# Patient Record
Sex: Female | Born: 1970
Health system: Southern US, Community
[De-identification: ages and names within clinical notes are randomized; demographics above are authoritative.]

## PROBLEM LIST (undated history)

## (undated) DIAGNOSIS — E119 Type 2 diabetes mellitus without complications: Secondary | ICD-10-CM

## (undated) DIAGNOSIS — R7303 Prediabetes: Secondary | ICD-10-CM

## (undated) DIAGNOSIS — R0689 Other abnormalities of breathing: Secondary | ICD-10-CM

## (undated) DIAGNOSIS — E669 Obesity, unspecified: Secondary | ICD-10-CM

## (undated) DIAGNOSIS — F329 Major depressive disorder, single episode, unspecified: Secondary | ICD-10-CM

## (undated) DIAGNOSIS — Z9889 Other specified postprocedural states: Secondary | ICD-10-CM

## (undated) DIAGNOSIS — E282 Polycystic ovarian syndrome: Secondary | ICD-10-CM

## (undated) DIAGNOSIS — R030 Elevated blood-pressure reading, without diagnosis of hypertension: Secondary | ICD-10-CM

## (undated) DIAGNOSIS — N920 Excessive and frequent menstruation with regular cycle: Secondary | ICD-10-CM

## (undated) DIAGNOSIS — D219 Benign neoplasm of connective and other soft tissue, unspecified: Secondary | ICD-10-CM

## (undated) DIAGNOSIS — F32A Depression, unspecified: Secondary | ICD-10-CM

## (undated) DIAGNOSIS — F419 Anxiety disorder, unspecified: Secondary | ICD-10-CM

## (undated) DIAGNOSIS — I499 Cardiac arrhythmia, unspecified: Secondary | ICD-10-CM

## (undated) DIAGNOSIS — I4891 Unspecified atrial fibrillation: Secondary | ICD-10-CM

## (undated) DIAGNOSIS — D649 Anemia, unspecified: Secondary | ICD-10-CM

## (undated) DIAGNOSIS — IMO0001 Reserved for inherently not codable concepts without codable children: Secondary | ICD-10-CM

## (undated) DIAGNOSIS — R112 Nausea with vomiting, unspecified: Secondary | ICD-10-CM

## (undated) DIAGNOSIS — Z9289 Personal history of other medical treatment: Secondary | ICD-10-CM

## (undated) DIAGNOSIS — E039 Hypothyroidism, unspecified: Secondary | ICD-10-CM

## (undated) DIAGNOSIS — G4733 Obstructive sleep apnea (adult) (pediatric): Secondary | ICD-10-CM

## (undated) HISTORY — DX: Depression, unspecified: F32.A

## (undated) HISTORY — DX: Obstructive sleep apnea (adult) (pediatric): G47.33

## (undated) HISTORY — DX: Reserved for inherently not codable concepts without codable children: IMO0001

## (undated) HISTORY — DX: Personal history of other medical treatment: Z92.89

## (undated) HISTORY — DX: Hypothyroidism, unspecified: E03.9

## (undated) HISTORY — PX: ENDOMETRIAL ABLATION: SHX621

## (undated) HISTORY — DX: Benign neoplasm of connective and other soft tissue, unspecified: D21.9

## (undated) HISTORY — PX: DILATION AND CURETTAGE, DIAGNOSTIC / THERAPEUTIC: SUR384

## (undated) HISTORY — DX: Unspecified atrial fibrillation: I48.91

## (undated) HISTORY — DX: Polycystic ovarian syndrome: E28.2

## (undated) HISTORY — DX: Obesity, unspecified: E66.9

## (undated) HISTORY — DX: Elevated blood-pressure reading, without diagnosis of hypertension: R03.0

## (undated) HISTORY — DX: Major depressive disorder, single episode, unspecified: F32.9

## (undated) HISTORY — DX: Prediabetes: R73.03

## (undated) HISTORY — DX: Excessive and frequent menstruation with regular cycle: N92.0

## (undated) HISTORY — DX: Anxiety disorder, unspecified: F41.9

## (undated) HISTORY — PX: BREAST SURGERY: SHX581

---

## 1995-09-04 HISTORY — PX: BLADDER SURGERY: SHX569

## 1999-06-01 ENCOUNTER — Other Ambulatory Visit: Admission: RE | Admit: 1999-06-01 | Discharge: 1999-06-01 | Payer: Self-pay | Admitting: Gynecology

## 1999-12-09 ENCOUNTER — Encounter: Payer: Self-pay | Admitting: Emergency Medicine

## 1999-12-09 ENCOUNTER — Emergency Department (HOSPITAL_COMMUNITY): Admission: EM | Admit: 1999-12-09 | Discharge: 1999-12-09 | Payer: Self-pay | Admitting: Internal Medicine

## 2000-01-05 ENCOUNTER — Other Ambulatory Visit: Admission: RE | Admit: 2000-01-05 | Discharge: 2000-01-05 | Payer: Self-pay | Admitting: Family Medicine

## 2000-09-09 ENCOUNTER — Ambulatory Visit (HOSPITAL_COMMUNITY): Admission: RE | Admit: 2000-09-09 | Discharge: 2000-09-09 | Payer: Self-pay | Admitting: *Deleted

## 2000-09-09 ENCOUNTER — Encounter: Payer: Self-pay | Admitting: *Deleted

## 2004-04-07 ENCOUNTER — Encounter: Admission: RE | Admit: 2004-04-07 | Discharge: 2004-04-07 | Payer: Self-pay | Admitting: *Deleted

## 2004-09-03 HISTORY — PX: HAND SURGERY: SHX662

## 2004-09-03 HISTORY — PX: RENAL BIOPSY, OPEN: SUR143

## 2005-03-19 ENCOUNTER — Emergency Department (HOSPITAL_COMMUNITY): Admission: EM | Admit: 2005-03-19 | Discharge: 2005-03-19 | Payer: Self-pay | Admitting: Emergency Medicine

## 2005-09-03 HISTORY — PX: TONSILLECTOMY: SUR1361

## 2008-07-05 DIAGNOSIS — M76829 Posterior tibial tendinitis, unspecified leg: Secondary | ICD-10-CM

## 2008-07-20 DIAGNOSIS — M722 Plantar fascial fibromatosis: Secondary | ICD-10-CM

## 2009-07-20 ENCOUNTER — Ambulatory Visit: Payer: Self-pay | Admitting: Sports Medicine

## 2009-07-20 DIAGNOSIS — M79609 Pain in unspecified limb: Secondary | ICD-10-CM | POA: Insufficient documentation

## 2009-09-13 ENCOUNTER — Encounter: Payer: Self-pay | Admitting: Internal Medicine

## 2010-01-16 ENCOUNTER — Ambulatory Visit: Payer: Self-pay | Admitting: Sports Medicine

## 2010-01-16 DIAGNOSIS — R269 Unspecified abnormalities of gait and mobility: Secondary | ICD-10-CM | POA: Insufficient documentation

## 2010-08-03 HISTORY — PX: ENDOMETRIAL ABLATION: SHX621

## 2010-08-29 ENCOUNTER — Ambulatory Visit (HOSPITAL_COMMUNITY)
Admission: RE | Admit: 2010-08-29 | Discharge: 2010-08-29 | Payer: Self-pay | Source: Home / Self Care | Attending: Obstetrics and Gynecology | Admitting: Obstetrics and Gynecology

## 2010-10-05 NOTE — Assessment & Plan Note (Signed)
Summary: 11:45 APPT FU BONE SPUR/PLANTAR FASCIITIS   Vital Signs:  Patient profile:   40 year old female BP sitting:   110 / 76  Vitals Entered By: Enid Baas MD (Jan 16, 2010 12:09 PM)  History of Present Illness: Left foot is much improved  however, if she fails to wear arch suport the PF sxs return now no real morning paijn walking sans limp no longer hurts to touch heel  Physical Exam  General:  Well-developed,well-nourished,in no acute distress; alert,appropriate and cooperative throughout examination Msk:  now nontender to palp at insertin of PF into left heel seated she has mod high arch but when she stands she has bilat long arch collapse also on LT she gets midfoot collapse and shift into calcaneal valgus good great toe motion  MSK [place] PF on left is now down to 0.57 and texture looks normal compared to Nov when was 0.68 and there was edema one small nodule noted 2 cms distal to insertion  RT PF however is only 0.34 thich and appears free of nodules   Impression & Recommendations:  Problem # 1:  FOOT PAIN (ICD-729.5)  This is much less but still needs good arch support particularly on left  given additional scaphoid pads for other shoes  Orders: [place] LIMITED ([id number])  Problem # 2:  PLANTAR FASCIITIS, LEFT (ICD-728.71)  much improved but still 60% thicker than RT  cont exrcises and stretches some icing  reck 4 mos  Orders: [place] LIMITED ([id number])  Problem # 3:  GAIT ABNORMALITY (ICD-781.2) doing OK with OTc inserts and arch supports  however these do not completley correct pronated gait on LT  consider custom orthotics if not getting pain relief in future

## 2010-11-13 LAB — CBC
HCT: 41.3 % (ref 36.0–46.0)
Hemoglobin: 13.9 g/dL (ref 12.0–15.0)
MCH: 30.5 pg (ref 26.0–34.0)
MCHC: 33.7 g/dL (ref 30.0–36.0)
MCV: 90.6 fL (ref 78.0–100.0)
Platelets: 294 10*3/uL (ref 150–400)
RBC: 4.56 MIL/uL (ref 3.87–5.11)
RDW: 12.4 % (ref 11.5–15.5)
WBC: 7 10*3/uL (ref 4.0–10.5)

## 2010-11-13 LAB — BASIC METABOLIC PANEL
BUN: 10 mg/dL (ref 6–23)
CO2: 28 mEq/L (ref 19–32)
Calcium: 8.8 mg/dL (ref 8.4–10.5)
Chloride: 107 mEq/L (ref 96–112)
Creatinine, Ser: 0.83 mg/dL (ref 0.4–1.2)
GFR calc Af Amer: >60 mL/min (ref >60)
GFR calc non Af Amer: >60 mL/min (ref >60)
Glucose, Bld: 86 mg/dL (ref 70–99)
Potassium: 4.1 mEq/L (ref 3.5–5.1)
Sodium: 138 mEq/L (ref 135–145)

## 2010-11-13 LAB — GLUCOSE, CAPILLARY

## 2010-11-21 ENCOUNTER — Encounter: Payer: Self-pay | Admitting: Internal Medicine

## 2010-11-21 ENCOUNTER — Encounter: Payer: Self-pay | Admitting: *Deleted

## 2010-12-01 ENCOUNTER — Encounter: Payer: Self-pay | Admitting: Internal Medicine

## 2010-12-01 ENCOUNTER — Ambulatory Visit (INDEPENDENT_AMBULATORY_CARE_PROVIDER_SITE_OTHER): Payer: 59 | Admitting: Internal Medicine

## 2010-12-01 VITALS — BP 110/82 | HR 71 | Ht 65.0 in | Wt 274.0 lb

## 2010-12-01 DIAGNOSIS — I4891 Unspecified atrial fibrillation: Secondary | ICD-10-CM | POA: Insufficient documentation

## 2010-12-01 DIAGNOSIS — Z0000 Encounter for general adult medical examination without abnormal findings: Secondary | ICD-10-CM

## 2010-12-01 NOTE — Patient Instructions (Signed)
Your physician recommends that you schedule a follow-up appointment in: 12 months with Dr Ross. 

## 2010-12-01 NOTE — Progress Notes (Signed)
HPI Patient is a 40 year old, who was referred for care of atrial fibrillation. The patient was previously followed by a cardiologist at [facility]. He has since retired. She had an echocardiogram, as well as a holter monitor there. She has been maintained on beta blocker.  The patient is followed by Marcelle Overlie here in [place]. She is currently having her Cytomel dose adjusted.  The patient says she has frequent episodes of palpitations. Now they come about 4 days per week and last anywhere from 10 minutes to one hour. She can get a little dizzy with the spells, but no syncope. She says she has to concentrate on what she is doing. Does feel a little short of breath.  When she is not having a spells  She feels fine.  Overall, she says this has been going on for years. Actually she was in the ER as a teenager and told she was just dehydrated.   Of note, she had a sleep study done 7 years ago. That was positive for sleep apnea. She had tonsillectomy for this by Dr. Pollyann Kennedy. She has not worn CPAP. She has not been restudied .She is not that active with her job. She sits most of the day.  She is having problems with a spur in her foot    Allergies not on file  Current Outpatient Prescriptions  Medication Sig Dispense Refill  . cetirizine (ZYRTEC) 10 MG tablet Take 10 mg by mouth daily.        . Cholecalciferol (VITAMIN D3) 10000 UNITS capsule Take 10,000 Units by mouth daily.        . citalopram (CELEXA) 40 MG tablet Take 40 mg by mouth daily.        . Liothyronine Sodium (CYTOMEL PO) Take 60 mcg by mouth daily.        . metFORMIN (GLUCOPHAGE) 500 MG tablet Take 500 mg by mouth 2 (two) times daily with a meal.        . metoprolol (TOPROL-XL) 100 MG 24 hr tablet Take 100 mg by mouth daily.        . pioglitazone-metformin (ACTOPLUS MET) 15-850 MG per tablet Take 1 tablet by mouth 2 (two) times daily with a meal.          Past Medical History  Diagnosis Date  . Palpitations     2006    . Menorrhagia     2011  . Thyroid disease   . Diabetes mellitus   . Depression   . PCOS (polycystic ovarian syndrome)     Past Surgical History  Procedure Date  . Endometrial ablation   . Dilation and curettage, diagnostic / therapeutic   . Tonsillectomy     Family History  Problem Relation Age of Onset  . Cardiomyopathy Father   . Hypertension      fathers side  . Hyperlipidemia      fathers side  . Heart failure      both grandparents - paternal  . Coronary artery disease Maternal Grandmother   . Heart attack Maternal Grandmother     and other maternal family members  . Heart attack Maternal Grandfather     History   Social History  . Marital Status: Divorced    Spouse Name: N/A    Number of Children: N/A  . 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 Smoking status: Former Smoker    Types: Cigarettes  .  Smokeless tobacco: Not on file  . Alcohol Use: Yes     maybe 2 a mth  . Drug Use: Yes    Special: Marijuana     in high school -- none since  . Sexually Active: Not on file   Other Topics Concern  . Not on file   Social History Narrative  . No narrative on file    Review of Systems:  All systems reviewed.  They are negative to the above problem except as previously stated.  Vital Signs: BP 110/82  Pulse 71  Ht 5\' 5"  (1.651 m)  Wt 274 lb (124.286 kg)  BMI 45.60 kg/m2  Physical Exam OBese 40 year old in NAD  HEENT:  Normocephalic, atraumatic. EOMI, PERRLA.  Neck: JVP is normal. No thyromegaly. No bruits.  Lungs: clear to auscultation. No rales no wheezes.  Heart: Regular rate and rhythm. Normal S1, S2. No S3.   No significant murmurs. PMI not displaced.  Abdomen:  Supple, nontender. Normal bowel sounds. No masses. No hepatomegaly.  Extremities:   Good distal pulses throughout. No lower extremity edema.  Musculoskeletal :moving all extremities.  Neuro:   alert and oriented x3.   CN II-XII grossly intact.  EKG: NSR.  71 bpm.   Assessment and Plan:

## 2010-12-04 ENCOUNTER — Telehealth: Payer: Self-pay | Admitting: Internal Medicine

## 2010-12-04 NOTE — Telephone Encounter (Signed)
Pt signed 3 ROI faxed over to  1.[facility] @ [phone] 2.[facility] @ [phone] 3.[facility] @ [phone] 12/04/10/KM

## 2010-12-04 NOTE — Telephone Encounter (Signed)
Spoke with [facility] @ [place] they have NO Records on this Pt  12/04/10/KM

## 2010-12-04 NOTE — Telephone Encounter (Signed)
[facility] sent ROI back they have no Pt with Name of Desiree Schultz 12/04/10/KM

## 2010-12-05 ENCOUNTER — Encounter: Payer: Self-pay | Admitting: Internal Medicine

## 2010-12-05 NOTE — Telephone Encounter (Signed)
Records received from [facility] gave to [facility] 12/05/10/KM

## 2010-12-12 ENCOUNTER — Telehealth: Payer: Self-pay | Admitting: Internal Medicine

## 2010-12-12 NOTE — Telephone Encounter (Signed)
LMOM for call back. Patient called back and I advised her that we could not obtain records from [facility] concerning the sleep study from 2007. We received a fax stating they had no records on her. Patient will call Dr.Rosen because he ordered the sleep study so he should have the results. Patient will ask Dr.Rosen's office to fax results to Dr.Ross. I provided her with Dr.Rosen's phone # and our fax#. Her goal is to be able to get a CPAP machine without obtaining another sleep study. I advised her that her insurance company would probably require a recent sleep study in order to warrant and pay for a CAP machine.

## 2010-12-12 NOTE — Progress Notes (Signed)
Addended by: Hardin Negus on: 12/12/2010 06:16 PM   Modules accepted: Orders

## 2010-12-13 NOTE — Progress Notes (Signed)
Addended by: Hardin Negus on: 12/13/2010 12:55 PM   Modules accepted: Orders

## 2010-12-21 NOTE — Telephone Encounter (Signed)
LMOM that we still have not received a copy of the sleep study from 2007. Advised also that Dr.Ross would like her to have a echo done at this office.

## 2010-12-26 ENCOUNTER — Telehealth: Payer: Self-pay | Admitting: Internal Medicine

## 2010-12-26 NOTE — Telephone Encounter (Signed)
Pt rtn call-you should be receiving fleet study records and she had an echo a year ago at [facility] and she will get you that report, but at this time she cannot afford to have another one at this time-pls call -pt knows it will be tomorrow

## 2010-12-29 ENCOUNTER — Emergency Department (HOSPITAL_COMMUNITY)
Admission: EM | Admit: 2010-12-29 | Discharge: 2010-12-29 | Disposition: A | Discharge status: Home / Self Care | Payer: 59 | Attending: Emergency Medicine | Admitting: Emergency Medicine

## 2010-12-29 ENCOUNTER — Telehealth: Payer: Self-pay | Admitting: Internal Medicine

## 2010-12-29 DIAGNOSIS — R42 Dizziness and giddiness: Secondary | ICD-10-CM | POA: Insufficient documentation

## 2010-12-29 DIAGNOSIS — E039 Hypothyroidism, unspecified: Secondary | ICD-10-CM | POA: Insufficient documentation

## 2010-12-29 DIAGNOSIS — R000 Tachycardia, unspecified: Secondary | ICD-10-CM | POA: Insufficient documentation

## 2010-12-29 DIAGNOSIS — R002 Palpitations: Secondary | ICD-10-CM | POA: Insufficient documentation

## 2010-12-29 DIAGNOSIS — I10 Essential (primary) hypertension: Secondary | ICD-10-CM | POA: Insufficient documentation

## 2010-12-29 DIAGNOSIS — R0602 Shortness of breath: Secondary | ICD-10-CM | POA: Insufficient documentation

## 2010-12-29 DIAGNOSIS — I4891 Unspecified atrial fibrillation: Secondary | ICD-10-CM | POA: Insufficient documentation

## 2010-12-29 DIAGNOSIS — Z79899 Other long term (current) drug therapy: Secondary | ICD-10-CM | POA: Insufficient documentation

## 2010-12-29 DIAGNOSIS — E669 Obesity, unspecified: Secondary | ICD-10-CM | POA: Insufficient documentation

## 2010-12-29 DIAGNOSIS — E119 Type 2 diabetes mellitus without complications: Secondary | ICD-10-CM | POA: Insufficient documentation

## 2010-12-29 LAB — URINALYSIS, ROUTINE W REFLEX MICROSCOPIC
Bilirubin Urine: NEGATIVE
Nitrite: NEGATIVE
Specific Gravity, Urine: 1.007 (ref 1.005–1.030)
Urobilinogen, UA: 0.2 mg/dL (ref 0.0–1.0)
pH: 6 (ref 5.0–8.0)

## 2010-12-29 LAB — POCT I-STAT, CHEM 8
Calcium, Ion: 1.08 mmol/L — ABNORMAL LOW (ref 1.12–1.32)
Chloride: 109 mEq/L (ref 96–112)
Creatinine, Ser: 0.9 mg/dL (ref 0.4–1.2)
Glucose, Bld: 84 mg/dL (ref 70–99)
HCT: 44 % (ref 36.0–46.0)
Hemoglobin: 15 g/dL (ref 12.0–15.0)

## 2010-12-29 LAB — POCT CARDIAC MARKERS
CKMB, poc: <1 ng/mL — ABNORMAL LOW (ref 1.0–8.0)
Troponin i, poc: <0.05 ng/mL (ref 0.00–0.09)

## 2010-12-29 NOTE — Telephone Encounter (Signed)
Pt calling stating palpitations,SOB--crying to point very hard to understand--advised to go to [facility] ED--pt agrees and will have friend drop her off--trish called, but N.A. At this time--nt

## 2010-12-29 NOTE — Telephone Encounter (Signed)
Pt having bad palp, sob. Blood pressure 180/100.

## 2010-12-29 NOTE — Telephone Encounter (Signed)
Received sleep study from 2007. Will review with Dr.Ross.

## 2010-12-31 LAB — URINE CULTURE: Culture  Setup Time: 201204271655

## 2011-01-01 ENCOUNTER — Telehealth: Payer: Self-pay | Admitting: Internal Medicine

## 2011-01-01 NOTE — Telephone Encounter (Signed)
Pt seen in er needs to see dr Tenny Craw within 2 weeks but doesn't want to wait and needs late pm, doesn't want to see the PA-wants to talk with nurse

## 2011-01-01 NOTE — Telephone Encounter (Signed)
Called patient back. She states that she was in the ER in atrial fib and converted with IV Cardizem. Made post hosp appointment for her to see Dr.Ross on 5/7 at 1030am. Advised her that Dr.Ross did receive a copy of her sleep study from 2007 but advised that she needs a pulmonary consult and another sleep study.  She will discuss this with Dr.Ross at next appointment.

## 2011-01-01 NOTE — Telephone Encounter (Signed)
See other note for detailed phone call.

## 2011-01-08 ENCOUNTER — Telehealth: Payer: Self-pay | Admitting: Internal Medicine

## 2011-01-08 ENCOUNTER — Ambulatory Visit (INDEPENDENT_AMBULATORY_CARE_PROVIDER_SITE_OTHER): Payer: 59 | Admitting: Internal Medicine

## 2011-01-08 ENCOUNTER — Encounter: Payer: Self-pay | Admitting: Internal Medicine

## 2011-01-08 VITALS — BP 94/50 | HR 53 | Ht 65.0 in | Wt 274.0 lb

## 2011-01-08 DIAGNOSIS — I4891 Unspecified atrial fibrillation: Secondary | ICD-10-CM

## 2011-01-08 DIAGNOSIS — G4730 Sleep apnea, unspecified: Secondary | ICD-10-CM

## 2011-01-08 MED ORDER — METOPROLOL TARTRATE 25 MG PO TABS: 25.0000 mg | ORAL_TABLET | Freq: Two times a day (BID) | ORAL | Status: DC

## 2011-01-08 MED ORDER — ASPIRIN 325 MG PO TABS: 325.0000 mg | ORAL_TABLET | Freq: Every day | ORAL | Status: DC

## 2011-01-08 MED ORDER — DILTIAZEM HCL ER COATED BEADS 180 MG PO CP24: 180.0000 mg | ORAL_CAPSULE | Freq: Every day | ORAL | Status: DC

## 2011-01-08 NOTE — Telephone Encounter (Signed)
Records Received from [facility] gave to [facility] 01/08/11/km

## 2011-01-08 NOTE — Patient Instructions (Signed)
You have been referred to  The Pulmonary Dept [address]  Please get a copy of your echocardiogram on a   CD and bring to Dr.Ross.  Increase ASA to 325mg  every day Decrease metoprolol to 25mg  2 times per day Start Cardizem 180mg  every day.

## 2011-01-09 ENCOUNTER — Encounter: Payer: Self-pay | Admitting: Internal Medicine

## 2011-01-09 ENCOUNTER — Telehealth: Payer: Self-pay | Admitting: Internal Medicine

## 2011-01-09 DIAGNOSIS — G4733 Obstructive sleep apnea (adult) (pediatric): Secondary | ICD-10-CM | POA: Insufficient documentation

## 2011-01-09 NOTE — Assessment & Plan Note (Signed)
Refer to pulmonary

## 2011-01-09 NOTE — Assessment & Plan Note (Signed)
Patient continues to have symptoms.  I would recomm a trial of Diltiazem 180.  Decrease metoprolol to 25 bid. She needs to be evaluated and treated for apnea.  This is exacerbating afib.  I will refer to pulmonary. I also need outside records.  If cannot obtain, she needs an echo.  She is worried about cost.  I am worried about potential CVA Will contact Dr. Vincente Poli re thyroid and also re outside records.   Needs Hgb A1C.  If diabetic needs to be on coumadin.

## 2011-01-09 NOTE — Progress Notes (Signed)
HPI Patient is a 54 year olid wht a history o atrial fibrillation, sleep apnea, hypothyroidism, obesity.  I saw her in clinic at the end of march.  After that I did not get any records from the outside (previously followed at [facility].  They do not have record of echo or holter.).  Sleep study from 5 years ago was positive for sleep apnea.  The patinet does not use CPAP On talking to her she continues to have problems with atrial fibrillation.  On last vist I recomm that she increase her metoprolol to 75 mg bid. She has noted no difference in how she feels.  When not in afib she feels fine.     No Known Allergies  Current Outpatient Prescriptions  Medication Sig Dispense Refill  . cetirizine (ZYRTEC) 10 MG tablet Take 10 mg by mouth daily.        . Cholecalciferol (VITAMIN D3) 10000 UNITS capsule Take 10,000 Units by mouth daily.        . citalopram (CELEXA) 40 MG tablet Take 40 mg by mouth daily.        . Liothyronine Sodium (CYTOMEL PO) Take 120 mcg by mouth daily.        . metFORMIN (GLUCOPHAGE-XR) 500 MG 24 hr tablet Take 500 mg by mouth 2 (two) times daily.        . pioglitazone-metformin (ACTOPLUS MET) 15-850 MG per tablet Take 1 tablet by mouth daily.       Marland Kitchen aspirin (BAYER ASPIRIN) 325 MG tablet Take 1 tablet (325 mg total) by mouth daily.  100 tablet  3  . diltiazem (CARDIZEM CD) 180 MG 24 hr capsule Take 1 capsule (180 mg total) by mouth daily.  30 capsule  11  . metoprolol tartrate (LOPRESSOR) 25 MG tablet Take 1 tablet (25 mg total) by mouth 2 (two) times daily.  60 tablet  11    Past Medical History  Diagnosis Date  . Palpitations     2006  . Menorrhagia     2011  . Thyroid disease   . Diabetes mellitus   . Depression   . PCOS (polycystic ovarian syndrome)     Past Surgical History  Procedure Date  . Endometrial ablation   . Dilation and curettage, diagnostic / therapeutic   . Tonsillectomy     Family History  Problem Relation Age of Onset  . Cardiomyopathy  Father   . Hypertension      fathers side  . Hyperlipidemia      fathers side  . Heart failure      both grandparents - paternal  . Coronary artery disease Maternal Grandmother   . Heart attack Maternal Grandmother     and other maternal family members  . Heart attack Maternal Grandfather     History   Social History  . Marital Status: Divorced    Spouse Name: N/A    Number of Children: N/A  . 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 Smoking status: Former Smoker    Types: Cigarettes  . Smokeless tobacco: Not on file  . Alcohol Use: Yes     maybe 2 a mth  . Drug Use: Yes    Special: Marijuana     in high school -- none since  . Sexually Active: Not on file   Other Topics Concern  . Not on file   Social History Narrative  .  No narrative on file    Review of Systems:  All systems reviewed.  They are negative to the above problem except as previously stated.  Vital Signs: BP 94/50  Pulse 53  Ht 5\' 5"  (1.651 m)  Wt 274 lb (124.286 kg)  BMI 45.60 kg/m2  Patinet is in NAD  Physical Exam  HEENT:  Normocephalic, atraumatic. EOMI, PERRLA.  Neck: JVP is normal. No thyromegaly. No bruits.  Lungs: clear to auscultation. No rales no wheezes.  Heart: Regular rate and rhythm. Normal S1, S2. No S3.   No significant murmurs. PMI not displaced.  Abdomen:  Supple, nontender. Normal bowel sounds. No masses. No hepatomegaly.  Extremities:   Good distal pulses throughout. No lower extremity edema.  Musculoskeletal :moving all extremities.  Neuro:   alert and oriented x3.  CN II-XII grossly intact. EKG:  Sinus bradycardia.  53 bpm.     Assessment and Plan:

## 2011-01-09 NOTE — Telephone Encounter (Signed)
All Cardiac faxed to [facility] @ [phone]  01/09/11/km

## 2011-02-05 ENCOUNTER — Encounter: Payer: Self-pay | Admitting: Pulmonary Disease

## 2011-02-05 ENCOUNTER — Ambulatory Visit (INDEPENDENT_AMBULATORY_CARE_PROVIDER_SITE_OTHER): Payer: 59 | Admitting: Pulmonary Disease

## 2011-02-05 VITALS — BP 104/72 | HR 50 | Temp 97.9°F | Ht 65.0 in | Wt 262.0 lb

## 2011-02-05 DIAGNOSIS — G4730 Sleep apnea, unspecified: Secondary | ICD-10-CM

## 2011-02-05 NOTE — Patient Instructions (Signed)
Will schedule sleep study Will call to schedule follow up after sleep study reviewed 

## 2011-02-05 NOTE — Progress Notes (Signed)
Subjective:    Patient ID: Desiree Schultz, female    DOB: 1970/11/27, 40 y.o.   MRN: [id number]  HPI CC: Desiree Schultz.  40 yo female for sleep evaluation.  She had sleep study in 2007 at [facility] sleep center.  She was told she has sleep apnea.  She lost her job, and was not able to afford therapy.  She had tonsillectomy with Dr. Pollyann Kennedy at that time also.  She continues to have trouble with her sleep.  She snores, and stills stops breathing while asleep.  She also has difficult to control atrial fibrillation.  She also has frequent jerking movements while asleep.  She goes to bed at 10 pm.  She does not use anything to help her sleep.  She wakes up several times to use the bathroom.  She gets out of bed between 6 and 9 am.  She denies morning headaches.  She drinks lots of caffeine.  She is using a mouth guard for teeth grinding.  She does talk in her sleep.  The patient denies sleep walking.  There is no history of restless legs, but does get frequent leg movements.  The patient denies sleep hallucinations, sleep paralysis, or cataplexy.  Her weight has been steady.  She quit smoking.  She does not drink much alcohol.   Past Medical History  Diagnosis Date  . Palpitations     2006  . Menorrhagia     2011  . Thyroid disease   . Diabetes mellitus   . Depression   . PCOS (polycystic ovarian syndrome)   . Atrial fibrillation   . OSA (obstructive sleep apnea)      Family History  Problem Relation Age of Onset  . Cardiomyopathy Father   . Hypertension      fathers side  . Hyperlipidemia      fathers side  . Heart failure      both grandparents - paternal  . Coronary artery disease Maternal Grandmother   . Heart attack Maternal Grandmother     and other maternal family members  . Heart attack Maternal Grandfather      History   Social History  . Marital Status: Divorced    Spouse Name: N/A    Number of Children: N/A  . 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 Smoking status: Former Smoker    Types: Cigarettes  . Smokeless tobacco: Not on file  . Alcohol Use: Yes     maybe 2 a mth  . Drug Use: Yes    Special: Marijuana     in high school -- none since  . Sexually Active: Not on file   Other Topics Concern  . Not on file   Social History Narrative  . No narrative on file     No Known Allergies   Outpatient Prescriptions Prior to Visit  Medication Sig Dispense Refill  . aspirin (BAYER ASPIRIN) 325 MG tablet Take 1 tablet (325 mg total) by mouth daily.  100 tablet  3  . cetirizine (ZYRTEC) 10 MG tablet Take 10 mg by mouth daily.        . Cholecalciferol (VITAMIN D3) 10000 UNITS capsule Take 10,000 Units by mouth daily.        . citalopram (CELEXA) 40 MG tablet Take 40 mg by mouth daily.        Marland Kitchen diltiazem (CARDIZEM CD) 180 MG 24 hr capsule  Take 1 capsule (180 mg total) by mouth daily.  30 capsule  11  . Liothyronine Sodium (CYTOMEL PO) Take 120 mcg by mouth daily.        . metFORMIN (GLUCOPHAGE-XR) 500 MG 24 hr tablet Take 500 mg by mouth 2 (two) times daily.        . metoprolol tartrate (LOPRESSOR) 25 MG tablet Take 1 tablet (25 mg total) by mouth 2 (two) times daily.  60 tablet  11  . pioglitazone-metformin (ACTOPLUS MET) 15-850 MG per tablet Take 1 tablet by mouth daily.        Review of Systems     Objective:   Physical Exam  BP 104/72  Pulse 50  Temp(Src) 97.9 F (36.6 C) (Oral)  Ht 5\' 5"  (1.651 m)  Wt 262 lb (118.842 kg)  BMI 43.60 kg/m2  SpO2 99%   General - Obese HEENT - PERRLA, EOMI, No sinus tenderness, MP 3, no oral exudate, no LAN, no thyromegaly Cardiac - s1s2 regular, no murmur, pulses symmetric Chest - no wheeze/rales/dullness Abd - obese, soft, nontender, +bowel sounds Ext - no e/c/c Neuro - normal strength, CN intact, A&O x 3 Psych - normal mood, behavior     Assessment & Plan:   OSA (obstructive sleep apnea) She had sleep study from 2007 and  was told she had sleep apnea.  She was not able to afford therapy at that time.  She has since had tonsillectomy.  She has a history of atrial fibrillation, diabetes, and depression.  I explained how sleep apnea can affect the patient's health.  Driving precautions and importance of weight loss were discussed.  Treatment options for sleep apnea were reviewed.  To further assess she will need repeat in-lab sleep study.    Updated Medication List Outpatient Encounter Prescriptions as of 02/05/2011  Medication Sig Dispense Refill  . aspirin (BAYER ASPIRIN) 325 MG tablet Take 1 tablet (325 mg total) by mouth daily.  100 tablet  3  . cetirizine (ZYRTEC) 10 MG tablet Take 10 mg by mouth daily.        . Cholecalciferol (VITAMIN D3) 10000 UNITS capsule Take 10,000 Units by mouth daily.        . citalopram (CELEXA) 40 MG tablet Take 40 mg by mouth daily.        Marland Kitchen diltiazem (CARDIZEM CD) 180 MG 24 hr capsule Take 1 capsule (180 mg total) by mouth daily.  30 capsule  11  . Liothyronine Sodium (CYTOMEL PO) Take 120 mcg by mouth daily.        . metFORMIN (GLUCOPHAGE-XR) 500 MG 24 hr tablet Take 500 mg by mouth 2 (two) times daily.        . metoprolol tartrate (LOPRESSOR) 25 MG tablet Take 1 tablet (25 mg total) by mouth 2 (two) times daily.  60 tablet  11  . DISCONTD: pioglitazone-metformin (ACTOPLUS MET) 15-850 MG per tablet Take 1 tablet by mouth daily.

## 2011-02-05 NOTE — Assessment & Plan Note (Addendum)
She had sleep study from 2007 and was told she had sleep apnea.  She was not able to afford therapy at that time.  She has since had tonsillectomy.  She has a history of atrial fibrillation, diabetes, and depression.  I explained how sleep apnea can affect the patient's health.  Driving precautions and importance of weight loss were discussed.  Treatment options for sleep apnea were reviewed.  To further assess she will need repeat in-lab sleep study.

## 2011-02-12 ENCOUNTER — Encounter: Payer: Self-pay | Admitting: Internal Medicine

## 2011-02-26 ENCOUNTER — Telehealth: Payer: Self-pay | Admitting: Internal Medicine

## 2011-02-26 DIAGNOSIS — I4891 Unspecified atrial fibrillation: Secondary | ICD-10-CM

## 2011-02-26 NOTE — Telephone Encounter (Signed)
Discuss medication,.

## 2011-02-26 NOTE — Telephone Encounter (Addendum)
Called patient back and she advised me that the Cardizem did not work for her so she went back on Metoprolol 50mg  in the am and 100mg  in the pm. She states that she is having lots of breakthru atrial fib with some fast rates. Discussed above with Dr.Ross. OK to order 48 hour holter monitor. Will send order to [facility] and call patient back.

## 2011-03-05 ENCOUNTER — Encounter (INDEPENDENT_AMBULATORY_CARE_PROVIDER_SITE_OTHER): Payer: 59

## 2011-03-05 DIAGNOSIS — I4891 Unspecified atrial fibrillation: Secondary | ICD-10-CM

## 2011-03-13 ENCOUNTER — Telehealth: Payer: Self-pay | Admitting: Internal Medicine

## 2011-03-13 NOTE — Telephone Encounter (Signed)
Pt calling wanting to know results of heart monitor test. Pt is at lunch, please call back within the hour. Pt is not available after 6pm.

## 2011-03-13 NOTE — Telephone Encounter (Signed)
Patient called to have the Holter monitor results. According to our records, the  Monitor results are  still in progress. Pt aware. She would like to have the results by the end of the  week if possible.

## 2011-03-15 NOTE — Telephone Encounter (Signed)
Pt calling back re being called back today or tomorrow, can only use her cell phone at lunch, today now until 1p and after 6p, tomorrow from 2-3p and after 6p

## 2011-03-16 NOTE — Telephone Encounter (Signed)
Called patient with holter monitor results. Advised her of sinus rhythm and some PVC's and PAC's. She is currently taking Metoprolol 50mg  in the am and 100mg  in the pm and is still complaining of her heart "stopping and feeling extremely SOB" with episodes lasting a few minutes. She wants to know what Dr.Ross can do about these symptoms. Advised her that I would discuss this with her again and call her back on 7/16.

## 2011-03-19 NOTE — Telephone Encounter (Signed)
rtn call back.  

## 2011-03-19 NOTE — Telephone Encounter (Signed)
Dr.Ross called patient. She advised her that her symptoms did not match results of holter monitor. Dr.Ross will write letter of necessity for an event monitor.

## 2011-03-22 ENCOUNTER — Encounter: Payer: Self-pay | Admitting: Internal Medicine

## 2011-03-22 ENCOUNTER — Other Ambulatory Visit: Payer: Self-pay | Admitting: *Deleted

## 2011-03-22 DIAGNOSIS — R002 Palpitations: Secondary | ICD-10-CM

## 2011-03-23 ENCOUNTER — Ambulatory Visit (HOSPITAL_BASED_OUTPATIENT_CLINIC_OR_DEPARTMENT_OTHER): Payer: 59 | Attending: Pulmonary Disease

## 2011-03-23 DIAGNOSIS — F329 Major depressive disorder, single episode, unspecified: Secondary | ICD-10-CM | POA: Insufficient documentation

## 2011-03-23 DIAGNOSIS — G4733 Obstructive sleep apnea (adult) (pediatric): Secondary | ICD-10-CM | POA: Insufficient documentation

## 2011-03-23 DIAGNOSIS — E119 Type 2 diabetes mellitus without complications: Secondary | ICD-10-CM | POA: Insufficient documentation

## 2011-03-23 DIAGNOSIS — G4763 Sleep related bruxism: Secondary | ICD-10-CM | POA: Insufficient documentation

## 2011-03-23 DIAGNOSIS — F3289 Other specified depressive episodes: Secondary | ICD-10-CM | POA: Insufficient documentation

## 2011-03-23 DIAGNOSIS — G4730 Sleep apnea, unspecified: Secondary | ICD-10-CM

## 2011-03-23 DIAGNOSIS — I4891 Unspecified atrial fibrillation: Secondary | ICD-10-CM | POA: Insufficient documentation

## 2011-03-26 NOTE — Procedures (Addendum)
NAME:  Desiree Schultz, Desiree Schultz               ACCOUNT NO.:  [id number]  MEDICAL RECORD NO.:  [id number]          PATIENT TYPE:  OUT  LOCATION:  SLEEP CENTER                 FACILITY:  [facility]  PHYSICIAN:  Coralyn Helling, MD        DATE OF BIRTH:  07/26/1971  DATE OF STUDY:  03/23/2011                           NOCTURNAL POLYSOMNOGRAM  REFERRING PHYSICIAN:  Coralyn Helling, MD  INDICATION FOR STUDY:  [name] is a 40 year old female who has a history of atrial fibrillation, depression, and diabetes.  She had a previous sleep study done several years ago and was told she had sleep apnea.  She had undergone tonsillectomy at that time.  Unfortunately, she had lost her insurance after this and was not able to afford additional therapy for sleep apnea.  She continues to have sleep disruption with daytime sleepiness and snoring.  She is, therefore, referred to the sleep lab for further evaluation of hypersomnia with obstructive sleep apnea.  Height is 5 feet 5 inches, weight is 285 pounds, BMI is 42, neck size is 16.5 inches.  EPWORTH SLEEPINESS SCORE:  14.  MEDICATIONS:  Celexa, metoprolol, Cytomel, aspirin, Zyrtec, vitamin D3, metformin, probiotic, and magnesium.  SLEEP ARCHITECTURE:  Total recording time was 423 minutes.  Total sleep time was 233 minutes.  Sleep efficiency was 55%.  Sleep latency was 56 minutes.  REM latency was 296 minutes.  The patient was observed in all stages of sleep and she slept exclusively in the non-supine position.  RESPIRATORY DATA:  The average respiratory rate was 18.  Loud snoring was noted by the technician.  The overall apnea/hypopnea index was 3. The respiratory disturbance index was 16.  The events were exclusively obstructive in nature.  The REM apnea/hypopnea index was 5 with non-REM apnea/hypopnea index of 2.2.  OXYGEN DATA:  The baseline oxygenation was 94%.  The oxygen saturation nadir was 90%.  The study was conducted without the use of  supplemental oxygen.  CARDIAC DATA:  The average heart rate was 58 and the rhythm strip showed sinus rhythm with occasional PVCs and PACs.  MOVEMENT-PARASOMNIA:  The periodic limb movement index was zero.  The patient had 2 restroom trips.  She did have several episodes of bruxism noted.  IMPRESSIONS-RECOMMENDATIONS:  This study shows evidence for mild obstructive sleep apnea with a respiratory disturbance index of 16.  Of note is that she had no supine sleep and, therefore, the severity of a sleep apnea may be underestimated.  She did have several episodes of bruxism noted.  In addition to diet, exercise, and weight reduction, additional therapeutic options could include CPAP therapy, oral appliance, or surgical intervention.     Coralyn Helling, MD Diplomat, American Board of Sleep Medicine Electronically Signed    VS/MEDQ  D:  03/25/2011 15:12:53  T:  03/26/2011 04:07:38  Job:  [id number]

## 2011-03-27 ENCOUNTER — Telehealth: Payer: Self-pay | Admitting: Pulmonary Disease

## 2011-03-27 DIAGNOSIS — G4733 Obstructive sleep apnea (adult) (pediatric): Secondary | ICD-10-CM

## 2011-03-27 NOTE — Telephone Encounter (Signed)
PSG 03/23/11>>AHI 3, RDI 16, SpO2 low 90%, bruxism, REM AHI 5, no supine sleep.  Will have my nurse schedule ROV to review results.

## 2011-03-28 ENCOUNTER — Telehealth: Payer: Self-pay | Admitting: Internal Medicine

## 2011-03-28 MED ORDER — METOPROLOL TARTRATE 50 MG PO TABS: ORAL_TABLET | ORAL | Status: DC

## 2011-03-28 NOTE — Telephone Encounter (Signed)
This has been done.

## 2011-03-28 NOTE — Telephone Encounter (Signed)
**Note De-identified Bailyn Spackman Obfuscation** [place] pt. 

## 2011-03-28 NOTE — Telephone Encounter (Signed)
Per pt call. Pt needs RX refill of 150 mg of Metoprolol called into [facility] on [address]. Pt is taking old RX of 50 mg at the prescribed dosage of 150 mg, therefore pt said current RX is dwindling quickly. Please call in RX refill.

## 2011-03-30 ENCOUNTER — Telehealth: Payer: Self-pay

## 2011-03-30 NOTE — Telephone Encounter (Signed)
Call patient for monitor ,she would like monitor mail out to her home Put the request in for [employer] to have this monitor mail to her,[employer] will call patient before the monitor is put in the mail.

## 2011-04-03 NOTE — Telephone Encounter (Signed)
ATC home number. No answer WCB

## 2011-04-04 DIAGNOSIS — G4733 Obstructive sleep apnea (adult) (pediatric): Secondary | ICD-10-CM

## 2011-04-04 DIAGNOSIS — G4763 Sleep related bruxism: Secondary | ICD-10-CM

## 2011-04-04 NOTE — Telephone Encounter (Signed)
Returning [name]'s call can be reached at [phone].Raylene Everts

## 2011-04-04 NOTE — Telephone Encounter (Signed)
lmomtcb x1 

## 2011-04-04 NOTE — Telephone Encounter (Signed)
Spoke with pt and notified needs appt to discuss PSG results. Appt sched for 04/19/11.

## 2011-04-04 NOTE — Telephone Encounter (Signed)
PT RETURNED CALL FROM [name]. SHE WILL CALL BACK AT 4PM SINCE SHE CANNOT LEAVE A PH #. Desiree Schultz

## 2011-04-16 ENCOUNTER — Telehealth: Payer: Self-pay | Admitting: Obstetrics and Gynecology

## 2011-04-16 NOTE — Telephone Encounter (Signed)
I advised the pt that Dr. Craige Cotta is not in the office today. Pt ok to keep original appt. Carron Curie, CMA

## 2011-04-17 ENCOUNTER — Telehealth: Payer: Self-pay | Admitting: Internal Medicine

## 2011-04-17 NOTE — Telephone Encounter (Signed)
Pt has a monitor on and it has gone off 4times today and she has called it in all four times and they told her it was premature heart beat and per pt it doesn't make her feel any better. She is shakey, tired, dizzy and she wants to talk to someone in the office about this.

## 2011-04-17 NOTE — Telephone Encounter (Signed)
Reviewed the patient's monitor that was called in. She only had a few PVC's. Advised her to contact us if she becomes SOB or experiences any CP. She currently denies this. She just feels really tired. I did inform her to avoid caffeine. Will route to Dr. Tenny Craw for further review/recommendations.

## 2011-04-17 NOTE — Telephone Encounter (Signed)
No new recs.  She should have an appt soon with [employer].

## 2011-04-19 ENCOUNTER — Encounter: Payer: Self-pay | Admitting: Pulmonary Disease

## 2011-04-19 ENCOUNTER — Ambulatory Visit (INDEPENDENT_AMBULATORY_CARE_PROVIDER_SITE_OTHER): Payer: 59 | Admitting: Pulmonary Disease

## 2011-04-19 ENCOUNTER — Encounter: Payer: Self-pay | Admitting: Internal Medicine

## 2011-04-19 VITALS — BP 110/64 | HR 57 | Temp 98.4°F | Ht 65.0 in | Wt 255.4 lb

## 2011-04-19 DIAGNOSIS — G4733 Obstructive sleep apnea (adult) (pediatric): Secondary | ICD-10-CM

## 2011-04-19 NOTE — Telephone Encounter (Signed)
Patient has an appointment with Dr.Sood today.

## 2011-04-19 NOTE — Patient Instructions (Signed)
Will arrange for CPAP set up at home Follow up in 2 months 

## 2011-04-19 NOTE — Assessment & Plan Note (Signed)
She had moderate sleep apnea.  I have reviewed his sleep test results with the patient.  Explained how sleep apnea can affect the patient's health.  Driving precautions and importance of weight loss were discussed.  Treatment options for sleep apnea were reviewed.  Will arrange for auto CPAP.

## 2011-04-19 NOTE — Progress Notes (Signed)
  Subjective:    Patient ID: Desiree Schultz, female    DOB: 1971-05-06, 40 y.o.   MRN: [id number]  HPI CC: Desiree Schultz  40 yo female with moderate sleep apnea.  She had sleep test on 03/23/11>>AHI 3, RDI 16, SpO2 low 90%, bruxism, REM AHI 5, no supine sleep.  Past Medical History  Diagnosis Date  . Palpitations     2006  . Menorrhagia     2011  . Thyroid disease   . Diabetes mellitus   . Depression   . PCOS (polycystic ovarian syndrome)   . Atrial fibrillation   . OSA (obstructive sleep apnea)     Review of Systems     Objective:   Physical Exam  BP 110/64  Pulse 57  Temp(Src) 98.4 F (36.9 C) (Oral)  Ht 5\' 5"  (1.651 m)  Wt 255 lb 6.4 oz (115.849 kg)  BMI 42.50 kg/m2  SpO2 98%  General - Obese  HEENT - PERRLA, EOMI, No sinus tenderness, MP 3, no oral exudate, no LAN, no thyromegaly  Cardiac - s1s2 regular, no murmur, pulses symmetric  Chest - no wheeze/rales/dullness  Abd - obese, soft, nontender, +bowel sounds  Ext - no e/c/c  Neuro - normal strength, CN intact, A&O x 3  Psych - normal mood, behavior       Assessment & Plan:   OSA (obstructive sleep apnea) She had moderate sleep apnea.  I have reviewed his sleep test results with the patient.  Explained how sleep apnea can affect the patient's health.  Driving precautions and importance of weight loss were discussed.  Treatment options for sleep apnea were reviewed.  Will arrange for auto CPAP.    Updated Medication List Outpatient Encounter Prescriptions as of 04/19/2011  Medication Sig Dispense Refill  . aspirin (BAYER ASPIRIN) 325 MG tablet Take 1 tablet (325 mg total) by mouth daily.  100 tablet  3  . cetirizine (ZYRTEC) 10 MG tablet Take 10 mg by mouth daily.        . Cholecalciferol (VITAMIN D3) 10000 UNITS capsule Take 10,000 Units by mouth daily.        . citalopram (CELEXA) 40 MG tablet Take 40 mg by mouth daily.        . Liothyronine Sodium (CYTOMEL PO) Take 120 mcg by mouth daily.        .  metFORMIN (GLUCOPHAGE-XR) 500 MG 24 hr tablet Take 500 mg by mouth 2 (two) times daily.        . metoprolol (LOPRESSOR) 50 MG tablet Take 1  tablet AM and 2 tablets PM  90 tablet  6  . DISCONTD: diltiazem (CARDIZEM CD) 180 MG 24 hr capsule Take 1 capsule (180 mg total) by mouth daily.  30 capsule  11

## 2011-05-03 ENCOUNTER — Encounter: Payer: Self-pay | Admitting: Internal Medicine

## 2011-05-23 ENCOUNTER — Telehealth: Payer: Self-pay | Admitting: Pulmonary Disease

## 2011-05-23 ENCOUNTER — Encounter: Payer: Self-pay | Admitting: Pulmonary Disease

## 2011-05-23 DIAGNOSIS — G4733 Obstructive sleep apnea (adult) (pediatric): Secondary | ICD-10-CM

## 2011-05-23 NOTE — Telephone Encounter (Signed)
Auto CPAP 04/25/11 to 05/14/11>>Used on 19 of 20 nights with average 7 hrs 53 min.  Average AHI 1.4 with optimal pressure 10 cm H2O.    Will have my nurse inform pt that CPAP report looked good, and will set CPAP setting at 10 cm H2O ( I have sent order to [facility]).  Will discuss in more detail at next visit.

## 2011-05-24 ENCOUNTER — Telehealth: Payer: Self-pay | Admitting: *Deleted

## 2011-05-24 NOTE — Telephone Encounter (Signed)
I spoke with patient about results and he verbalized understanding and had no questions 

## 2011-05-24 NOTE — Telephone Encounter (Signed)
lmomtcb  

## 2011-05-24 NOTE — Telephone Encounter (Signed)
PER DR ROSS PT NEEDS TO START FLECAINIDE  50 MG BID AND METOPROLOL 50 MG BID AND TO COME IN FOR  EKG IN 10 DAYS  BASED ON MONITOR RESULTS SINUS RHYTHM WITH PAC'S AND OCC AFIB  NOT CONSISTENTLY SYMPTOMATIC .Zack Seal

## 2011-05-29 MED ORDER — FLECAINIDE ACETATE 50 MG PO TABS: 50.0000 mg | ORAL_TABLET | Freq: Two times a day (BID) | ORAL | Status: DC

## 2011-05-29 MED ORDER — METOPROLOL TARTRATE 50 MG PO TABS: ORAL_TABLET | ORAL | Status: DC

## 2011-05-29 NOTE — Telephone Encounter (Signed)
PT AWARE OF MONITOR RESULTS WILL  BE BACK IN TOWN ON SUN 06-03-11  WILL START FLECAINIDE AT THAT TIME AND WILL CALL BACK TO SCHEDULE EKG .  FYI  PT HAS C-PAP AT THIS TIME AND IS FEELING GOOD .Zack Seal

## 2011-05-29 NOTE — Telephone Encounter (Signed)
Lmctcb./cy

## 2011-06-06 ENCOUNTER — Telehealth: Payer: Self-pay | Admitting: Internal Medicine

## 2011-06-06 NOTE — Telephone Encounter (Signed)
Pt states she started flecainide 50mg  bid Monday night. She took a dose Monday night, Tuesday morning, Tuesday evening. 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 Pt states she got 9 hours of sleep last night. Pt states she does not know what her heart rate and BP were and she feels she is well hydrated. Pt denies dizziness or feeling like she is going to pass out. Pt has decreased  her metoprolol from 50 mg am and 100mg   pm to 50mg  bid. She did this when she started flecainide. Pt feels her sleepiness is related to flecainide. Pt is asking for recommendations from Dr Tenny Craw about this. I will forward to Dr Tenny Craw for review.

## 2011-06-06 NOTE — Telephone Encounter (Signed)
Pt called and stated Dr. Tenny Craw had just started a new medication to regulate her rhythm.  She is extremely sleepy since taking this.  Will this get better or does she need to take a smaller dose.  Aware that Annice Pih is off but would like someone to check on this for her as soon as possible.

## 2011-06-06 NOTE — Telephone Encounter (Signed)
I talked with pt. 

## 2011-06-12 NOTE — Telephone Encounter (Signed)
Patient was made aware of MD's recommendation, pt  would like to know if she can finish  take the medication, night dose only. She paid $15:00 for the medication and she has many pills left.

## 2011-06-12 NOTE — Telephone Encounter (Signed)
Tell her to stop it and see if the fatigue truly gets better.  Call back with response.

## 2011-06-12 NOTE — Telephone Encounter (Signed)
Spoke with patient,she said the Flecainide 50 mg twice a day is making her sleepy, so she is taken the night dose only. She would like to know if she can double the dose at night. Patient started taken the medication on 06/04/11, she  will come on Thursday AM for an EKG. Appointment made for 10:00AM.

## 2011-06-12 NOTE — Telephone Encounter (Signed)
Patient agreed with MD's recommendations. She said will start take the Metoprolol as before 50 mg in the AM and 100 mg in the evening.

## 2011-06-12 NOTE — Telephone Encounter (Signed)
I would not use in this way. She should stop.   Would let it wash out of her system  Wll get back with other options.

## 2011-06-12 NOTE — Telephone Encounter (Signed)
Left msg on patient's voice mail to see how feeling. Needs to come in to get EKG  10 days after starting Flecanide.  I do not see htat it has been done.

## 2011-06-27 ENCOUNTER — Encounter: Payer: Self-pay | Admitting: Pulmonary Disease

## 2011-10-12 ENCOUNTER — Encounter: Payer: Self-pay | Admitting: Internal Medicine

## 2011-10-22 ENCOUNTER — Other Ambulatory Visit: Payer: Self-pay | Admitting: Internal Medicine

## 2011-10-25 ENCOUNTER — Encounter: Payer: Self-pay | Admitting: Nurse Practitioner

## 2011-10-25 ENCOUNTER — Ambulatory Visit (INDEPENDENT_AMBULATORY_CARE_PROVIDER_SITE_OTHER): Payer: 59 | Admitting: Nurse Practitioner

## 2011-10-25 VITALS — BP 102/72 | HR 63 | Ht 65.0 in | Wt 273.0 lb

## 2011-10-25 DIAGNOSIS — I4891 Unspecified atrial fibrillation: Secondary | ICD-10-CM

## 2011-10-25 NOTE — Assessment & Plan Note (Signed)
Presents again with more frequent episodes of atrial fib. Appears to be intolerant to cardizem and flecainide due to sleepiness. Will need to discuss with Dr. Tenny Craw.

## 2011-10-25 NOTE — Patient Instructions (Addendum)
We will make arrangements for you to see one of the EP physicians. This will be to discuss other options to deal with your atrial fib.   Continue with your current medicines.

## 2011-10-25 NOTE — Progress Notes (Signed)
Desiree Schultz Date of Birth: 1970/12/01 Medical Record #[id number]  History of Present Illness: Desiree Schultz is seen today for a work in visit. Desiree Schultz is seen for Dr. Tenny Craw. Desiree Schultz has a history of long standing PAF, obesity, sleep apnea and thyroid disorder. Desiree Schultz is diabetic. Desiree Schultz is not on coumadin. Her CHADs is a 1 (for DM), CHADSvasc is 2 (DM and gender). Desiree Schultz is followed by Dr. Craige Cotta for her sleep apnea.   Desiree Schultz comes in today. Desiree Schultz is quite upset. Desiree Schultz is having more frequent episodes of palpitations. Seems to be cyclical with her menses. Thyroid dose has been adjusted recently as well. Desiree Schultz assumes that it is atrial fib. 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 Got horribly short of breath. Lasted for about 30 minutes. Then terribly weak. Adamant that something be done and wants "someone to take her seriously". Says Desiree Schultz cannot tolerate Diltiazem because it makes her too sleepy. On 150 mg of Metoprolol. Does not appear that Desiree Schultz has ever seen EP. But does appear that Desiree Schultz has tried Flecainide in the past but got too sleepy. Desiree Schultz does not "like wasting her money". Desiree Schultz is concerned because of several family members that have died young with reported rhythm issues.  Last echo was in [facility] in 2011 and showed a normal EF of 66%, mild MR & TR but basically normal. Last event monitor was in July of 2012. PAC's and PVC's were noted.   Current Outpatient Prescriptions on File Prior to Visit  Medication Sig Dispense Refill  . aspirin (BAYER ASPIRIN) 325 MG tablet Take 1 tablet (325 mg total) by mouth daily.  100 tablet  3  . cetirizine (ZYRTEC) 10 MG tablet Take 10 mg by mouth daily.        . citalopram (CELEXA) 40 MG tablet Take 60 mg by mouth daily.       . Liothyronine Sodium (CYTOMEL PO) Take 150 mcg by mouth daily.       . metFORMIN (GLUCOPHAGE-XR) 500 MG 24 hr tablet Take 500 mg by mouth 2 (two) times daily.        Marland Kitchen DISCONTD: metoprolol (LOPRESSOR) 50 MG tablet Take 1  tablet AM and 2  tablets PM  90 tablet  6  . DISCONTD: Cholecalciferol (VITAMIN D3) 10000 UNITS capsule Take 10,000 Units by mouth daily.        Marland Kitchen DISCONTD: metoprolol (LOPRESSOR) 50 MG tablet Take 1 tab twice a day  60 tablet  5    Allergies  Allergen Reactions  . Fish Oil   . Sulfur     Past Medical History  Diagnosis Date  . Palpitations     2006  . Menorrhagia     2011  . Thyroid disease   . Diabetes mellitus   . Depression   . PCOS (polycystic ovarian syndrome)   . PAF (paroxysmal atrial fibrillation)   . OSA (obstructive sleep apnea)   . Obesity (BMI 30-39.9)     Past Surgical History  Procedure Date  . Endometrial ablation   . Dilation and curettage, diagnostic / therapeutic   . Tonsillectomy     History  Smoking status  . Former Smoker -- 2.0 packs/day for 16 years  . Types: Cigarettes  . Quit date: 09/03/1998  Smokeless tobacco  . Not on file    History  Alcohol Use  . Yes    maybe 2 a mth    Family History  Problem Relation Age of Onset  .  Cardiomyopathy Father   . Hypertension      fathers side  . Hyperlipidemia      fathers side  . Heart failure      both grandparents - paternal  . Coronary artery disease Maternal Grandmother   . Heart attack Maternal Grandmother     and other maternal family members  . Heart attack Maternal Grandfather     Review of Systems: The review of systems is per the HPI.  On CPAP for sleep apnea. Uses caffeine which apparently makes no difference in how Desiree Schultz feels. Has more spells right before menses. All other systems were reviewed and are negative.  Physical Exam: BP 102/72  Pulse 63  Ht 5\' 5"  (1.651 m)  Wt 273 lb (123.832 kg)  BMI 45.43 kg/m2 Weight is unchanged basically from last visit.  Patient is subsequently seen with Dr. Tenny Craw.  Patient is alert and in no acute distress. Desiree Schultz is morbidly obese. Skin is warm and dry. Color is normal.  HEENT is unremarkable. Normocephalic/atraumatic. PERRL. Sclera are nonicteric. Neck  is supple. No masses. No JVD. Lungs are clear. Cardiac exam shows a regular rate and rhythm. Abdomen is obese but soft. Extremities are without edema. Gait and ROM are intact. No gross neurologic deficits noted.  LABORATORY DATA: EKG today shows sinus rhythm. Tracing is reviewed with Dr. Tenny Craw.  Assessment / Plan:

## 2011-10-26 ENCOUNTER — Telehealth: Payer: Self-pay | Admitting: Nurse Practitioner

## 2011-10-26 ENCOUNTER — Other Ambulatory Visit: Payer: Self-pay

## 2011-10-26 MED ORDER — METOPROLOL TARTRATE 50 MG PO TABS: 150.0000 mg | ORAL_TABLET | Freq: Every day | ORAL | Status: DC

## 2011-10-26 NOTE — Telephone Encounter (Signed)
New msg Pt was calling back about appt she had yesterday. She had some questions please call

## 2011-10-26 NOTE — Telephone Encounter (Signed)
Advised patient of her appointment with EP

## 2011-11-14 ENCOUNTER — Ambulatory Visit (INDEPENDENT_AMBULATORY_CARE_PROVIDER_SITE_OTHER): Payer: 59 | Admitting: Internal Medicine

## 2011-11-14 ENCOUNTER — Encounter: Payer: Self-pay | Admitting: Internal Medicine

## 2011-11-14 VITALS — Ht 65.0 in | Wt 273.8 lb

## 2011-11-14 DIAGNOSIS — I4891 Unspecified atrial fibrillation: Secondary | ICD-10-CM

## 2011-11-14 DIAGNOSIS — G4733 Obstructive sleep apnea (adult) (pediatric): Secondary | ICD-10-CM

## 2011-11-14 DIAGNOSIS — Z136 Encounter for screening for cardiovascular disorders: Secondary | ICD-10-CM

## 2011-11-14 DIAGNOSIS — E039 Hypothyroidism, unspecified: Secondary | ICD-10-CM

## 2011-11-14 DIAGNOSIS — E669 Obesity, unspecified: Secondary | ICD-10-CM | POA: Insufficient documentation

## 2011-11-14 LAB — HEPATIC FUNCTION PANEL
ALT: 24 U/L (ref 0–35)
AST: 15 U/L (ref 0–37)
Albumin: 3.9 g/dL (ref 3.5–5.2)
Total Protein: 6.9 g/dL (ref 6.0–8.3)

## 2011-11-14 MED ORDER — DRONEDARONE HCL 400 MG PO TABS: 400.0000 mg | ORAL_TABLET | Freq: Two times a day (BID) | ORAL | Status: DC

## 2011-11-14 NOTE — Assessment & Plan Note (Addendum)
The patient has symptomatic paroxysmal atrial fibrillation.  She has not tolerated flecainide in the past.  Therapeutic strategies for afib including medicine and ablation were discussed in detail with the patient today. Risk, benefits, and alternatives to EP study and radiofrequency ablation for afib were also discussed in detail today.  At this time, she wishes to avoid ablation.  She is willing to try another AAD.  I have offered rhythmol or multaq.  She is clear that she would like to try multaq.  I will decrease metoprolol to 25mg  BID due to bradycardia today and then start multaq 400mg  BID.  If she does not tolerate multaq, then we could try rhythmol, tikosyn, or proceed with ablation. We will check LFTs today upon starting multaq and again upon return in 6 weeks.  Her CHADS2 score is 1.  I have recommended pradaxa for stroke prevention.  At this time, she is clear in her decision to continue ASA.  She will contemplate anticoagulation and discuss this further with me upon return.

## 2011-11-14 NOTE — Patient Instructions (Signed)
Your physician recommends that you schedule a follow-up appointment in: 6 weeks with Dr Johney Frame  Your physician has recommended you make the following change in your medication:  1) Start Multaq 400mg  twice daily with food 2) Decrease Metoprolol to 25mg  twice daily

## 2011-11-14 NOTE — Assessment & Plan Note (Signed)
Compliance with CPAP will be necessary to maintain sinus rhythm long term.

## 2011-11-14 NOTE — Assessment & Plan Note (Signed)
She is presently having medication adjusted by her PCP

## 2011-11-14 NOTE — Assessment & Plan Note (Signed)
Weight loss is advised 

## 2011-11-14 NOTE — Progress Notes (Signed)
Primary Care Physician: Jeani Hawking, MD, MD Referring Physician:  Dr Lynnea Maizes Desiree Schultz is a 41 y.o. female with a h/o paroxysmal atrial fibrillation, obesity, and OSA who presents today for EP consultation.  She reports being diagnosed with atrial fibrillation in 2010 after wearing a heart monitor for palpitations.  She was seen by a Doctor in [facility] and initiated on toprol.   In retrospect, she thinks that she has had afib since 2006.  She was subsequently evaluated by Dr Tenny Craw.  She was placed on diltiazem but did not tolerate this medicine due to sleepiness and felt that it "did not work".  She then tried flecainide but became "very sleepy" with medication.  She has not tried any other AAD. She reports that she has afib frequently.  She feels that episodes are worse during menses.  Episodes typically last several days.  She had not required cardioversion. She reports symptoms of palpitations, SOB, and fatigue during her afib.  She has occasional dizziness during afib, but denies presyncope, syncope, or neurologic sequela. The patient is tolerating medications without difficulties and is otherwise without complaint today.   Past Medical History  Diagnosis Date  . Atrial fibrillation     2006  . Menorrhagia     2011  . Hypothyroid   . Diabetes mellitus   . Depression   . PCOS (polycystic ovarian syndrome)   . OSA (obstructive sleep apnea)     on CPAP  . Obesity (BMI 30-39.9)    Past Surgical History  Procedure Date  . Endometrial ablation   . Dilation and curettage, diagnostic / therapeutic   . Tonsillectomy     Current Outpatient Prescriptions  Medication Sig Dispense Refill  . aspirin (BAYER ASPIRIN) 325 MG tablet Take 1 tablet (325 mg total) by mouth daily.  100 tablet  3  . cetirizine (ZYRTEC) 10 MG tablet Take 10 mg by mouth daily.        . citalopram (CELEXA) 40 MG tablet Take 60 mg by mouth daily.       . Liothyronine Sodium (CYTOMEL PO) Take 150 mcg by mouth  daily.       . metFORMIN (GLUCOPHAGE-XR) 500 MG 24 hr tablet Take 500 mg by mouth 2 (two) times daily.        . metoprolol (LOPRESSOR) 50 MG tablet Take 3 tablets (150 mg total) by mouth daily. Take 1  tablet AM and 2 tablets PM  90 tablet  11  . DISCONTD: Cholecalciferol (VITAMIN D3) 10000 UNITS capsule Take 10,000 Units by mouth daily.          Allergies  Allergen Reactions  . Fish Oil   . Sulfur     History   Social History  . Marital Status: Single    Spouse Name: N/A    Number of Children: N/A  . 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 Smoking status: Former Smoker -- 2.0 packs/day for 16 years    Types: Cigarettes    Quit date: 09/03/1998  . Smokeless tobacco: Not on file  . Alcohol Use: Yes     maybe 2 a mth  . Drug Use: Yes    Special: Marijuana     in high school -- none since  . Sexually Active: Not on file   Other Topics Concern  . Not on file   Social History Narrative   Pt  lives in [place] with friend.  Works as a Financial risk analyst    Family History  Problem Relation Age of Onset  . Cardiomyopathy Father   . Hypertension      fathers side  . Hyperlipidemia      fathers side  . Heart failure      both grandparents - paternal  . Coronary artery disease Maternal Grandmother   . Heart attack Maternal Grandmother     and other maternal family members  . Heart attack Maternal Grandfather     ROS- All systems are reviewed and negative except as per the HPI above  Physical Exam: Filed Vitals:   11/14/11 0841  Height: 5\' 5"  (1.651 m)  Weight: 124.195 kg (273 lb 12.8 oz)    GEN- The patient is overweight appearing, alert and oriented x 3 today.   Head- normocephalic, atraumatic Eyes-  Sclera clear, conjunctiva pink Ears- hearing intact Oropharynx- clear Neck- supple, no JVP Lymph- no cervical lymphadenopathy Lungs- Clear to ausculation bilaterally, normal work of breathing Heart-  Regular rate and rhythm, no murmurs, rubs or gallops, PMI not laterally displaced GI- soft, NT, ND, + BS Extremities- no clubbing, cyanosis, or edema MS- no significant deformity or atrophy Skin- no rash or lesion Psych- euthymic mood, full affect Neuro- strength and sensation are intact  EKG today reveals sinus bradycardia 50 bpm, PR 174, QRS 74, Qtc 415, otherwise normal ekg  Assessment and Plan:

## 2011-12-03 ENCOUNTER — Telehealth: Payer: Self-pay | Admitting: Internal Medicine

## 2011-12-03 MED ORDER — FLECAINIDE ACETATE 50 MG PO TABS: 50.0000 mg | ORAL_TABLET | Freq: Two times a day (BID) | ORAL | Status: DC

## 2011-12-03 NOTE — Telephone Encounter (Signed)
She wants to try Flecainide again at a higher dose.  She thinks it was the Metoprolol that was making her tired not the Flecainide and the Multaq is too expensive  I have looked and we do not have any samples of this medication. She was on Flecainide 50mg  twice daily before and I have discussed with Dr Graciela Husbands and he says this is reasonable and for her to keep her follow up appointment as scheduled with Dr Graciela Husbands.  Patient is aware of the above and will go back on the Flecainide and call us back if there are problems

## 2011-12-03 NOTE — Telephone Encounter (Signed)
Pt calling re new pills multaq being  too expensive , wants to try flecanide again but increase dosage pls call

## 2011-12-26 ENCOUNTER — Ambulatory Visit (INDEPENDENT_AMBULATORY_CARE_PROVIDER_SITE_OTHER): Payer: 59 | Admitting: Internal Medicine

## 2011-12-26 ENCOUNTER — Encounter: Payer: Self-pay | Admitting: Internal Medicine

## 2011-12-26 ENCOUNTER — Other Ambulatory Visit: Payer: Self-pay | Admitting: *Deleted

## 2011-12-26 VITALS — BP 137/70 | HR 82 | Resp 18 | Ht 66.0 in | Wt 276.1 lb

## 2011-12-26 DIAGNOSIS — I4891 Unspecified atrial fibrillation: Secondary | ICD-10-CM

## 2011-12-26 NOTE — Patient Instructions (Signed)
Your physician wants you to follow-up in: 4 months with Dr Allred You will receive a reminder letter in the mail two months in advance. If you don't receive a letter, please call our office to schedule the follow-up appointment.  

## 2011-12-26 NOTE — Assessment & Plan Note (Signed)
Controlled with once daily flecainide She failed medical therapy with multaq She declines anticoagulant therapy, though given diabetes really should be either on pradaxa, xarelto, or eliquis. She will further contemplate.

## 2011-12-26 NOTE — Progress Notes (Signed)
Primary Care Physician: Jeani Hawking, MD, MD Referring Physician:  Dr Lynnea Maizes Desiree Schultz is a 41 y.o. female with a h/o paroxysmal atrial fibrillation, obesity, and OSA who presents today for EP follow-up.  She reports being diagnosed with atrial fibrillation in 2010 after wearing a heart monitor for palpitations.  She was seen by a Doctor in [facility] and initiated on toprol.   In retrospect, she thinks that she has had afib since 2006.  She was subsequently evaluated by Dr Tenny Craw.  She was placed on diltiazem but did not tolerate this medicine due to sleepiness and felt that it "did not work".  She then tried flecainide but became "very sleepy" with medication.  She was evaluated by me and started on multaq.  She did not tolerate this and has returned to flecainide 50mg  qpm only.  She has seen endocrine recently and has been taken off of thyroid replacement.  She feels presently that her afib is controlled with this strategy.  She does not wish to make further changes today.   Past Medical History  Diagnosis Date  . Atrial fibrillation     2006  . Menorrhagia     2011  . Hypothyroid   . Diabetes mellitus   . Depression   . PCOS (polycystic ovarian syndrome)   . OSA (obstructive sleep apnea)     on CPAP  . Obesity (BMI 30-39.9)   . Chronic anxiety   . Vitamin d deficiency   . Allergic rhinitis   . Paroxysmal atrial fibrillation   . Prediabetes     with impared glucose tolerance.   Past Surgical History  Procedure Date  . Endometrial ablation   . Dilation and curettage, diagnostic / therapeutic   . Tonsillectomy 2007  . Bladder surgery 1997    interstitial cystitis   . Hand surgery 2006    due to cat bite  . Renal biopsy, open 2006  . Endometrial ablation 08/2010    Current Outpatient Prescriptions  Medication Sig Dispense Refill  . aspirin (BAYER ASPIRIN) 325 MG tablet Take 1 tablet (325 mg total) by mouth daily.  100 tablet  3  . cetirizine (ZYRTEC) 10 MG tablet  Take 10 mg by mouth daily.        . citalopram (CELEXA) 20 MG tablet Take 60 mg by mouth daily.      . flecainide (TAMBOCOR) 50 MG tablet Take 1 tablet (50 mg total) by mouth 2 (two) times daily.  60 tablet  3  . Liothyronine Sodium (CYTOMEL PO) Take 150 mcg by mouth daily.       . metFORMIN (GLUCOPHAGE-XR) 500 MG 24 hr tablet Take 500 mg by mouth 2 (two) times daily.        . metoprolol (LOPRESSOR) 50 MG tablet Take 0.5 tablets (25 mg total) by mouth 2 (two) times daily. Take 1  tablet AM and 2 tablets PM  90 tablet  11  . spironolactone (ALDACTONE) 25 MG tablet Take 25 mg by mouth daily.      Marland Kitchen DISCONTD: Cholecalciferol (VITAMIN D3) 10000 UNITS capsule Take 10,000 Units by mouth daily.          Allergies  Allergen Reactions  . Fish Oil   . Flecainide     Sedated.  . Pneumococcal Vaccines   . Sulfur     History   Social History  . Marital Status: Single    Spouse Name: N/A    Number of Children: N/A  .  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 Smoking status: Former Smoker -- 2.0 packs/day for 16 years    Types: Cigarettes    Quit date: 09/03/1998  . Smokeless tobacco: Not on file  . Alcohol Use: Yes     maybe 2 a mth  . Drug Use: Yes    Special: Marijuana     in high school -- none since  . Sexually Active: Not on file   Other Topics Concern  . Not on file   Social History Narrative   Pt lives in [place] with friend.  Works as a Financial risk analyst    Family History  Problem Relation Age of Onset  . Cardiomyopathy Father   . Hypertension      fathers side  . Hyperlipidemia      fathers side  . Heart failure      both grandparents - paternal  . Coronary artery disease Maternal Grandmother   . Heart attack Maternal Grandmother     and other maternal family members  . Heart attack Maternal Grandfather    Physical Exam: Filed Vitals:   12/26/11 1041  BP: 137/70  Pulse: 82  Resp: 18  Height: 5\' 6"   (1.676 m)  Weight: 276 lb 1.9 oz (125.247 kg)    GEN- The patient is overweight appearing, alert and oriented x 3 today.   Head- normocephalic, atraumatic Eyes-  Sclera clear, conjunctiva pink Ears- hearing intact Oropharynx- clear Neck- supple, no JVP Lymph- no cervical lymphadenopathy Lungs- Clear to ausculation bilaterally, normal work of breathing Heart- Regular rate and rhythm, no murmurs, rubs or gallops, PMI not laterally displaced GI- soft, NT, ND, + BS Extremities- no clubbing, cyanosis, or edema   Assessment and Plan:

## 2012-02-20 ENCOUNTER — Encounter (HOSPITAL_COMMUNITY): Payer: Self-pay | Admitting: *Deleted

## 2012-02-20 ENCOUNTER — Emergency Department (HOSPITAL_COMMUNITY)
Admission: EM | Admit: 2012-02-20 | Discharge: 2012-02-20 | Disposition: A | Discharge status: Home / Self Care | Payer: 59 | Source: Home / Self Care | Attending: Family Medicine | Admitting: Family Medicine

## 2012-02-20 ENCOUNTER — Telehealth: Payer: Self-pay | Admitting: Internal Medicine

## 2012-02-20 DIAGNOSIS — L259 Unspecified contact dermatitis, unspecified cause: Secondary | ICD-10-CM

## 2012-02-20 DIAGNOSIS — L239 Allergic contact dermatitis, unspecified cause: Secondary | ICD-10-CM

## 2012-02-20 DIAGNOSIS — I498 Other specified cardiac arrhythmias: Secondary | ICD-10-CM

## 2012-02-20 DIAGNOSIS — R001 Bradycardia, unspecified: Secondary | ICD-10-CM

## 2012-02-20 LAB — CBC
HCT: 40.7 % (ref 36.0–46.0)
Hemoglobin: 13.8 g/dL (ref 12.0–15.0)
MCH: 30.9 pg (ref 26.0–34.0)
MCHC: 33.9 g/dL (ref 30.0–36.0)
RBC: 4.46 MIL/uL (ref 3.87–5.11)

## 2012-02-20 LAB — TSH: TSH: 1.022 u[IU]/mL (ref 0.350–4.500)

## 2012-02-20 MED ORDER — PREDNISONE 10 MG PO TABS: ORAL_TABLET | ORAL | Status: DC

## 2012-02-20 MED ORDER — NYSTATIN 100000 UNIT/GM EX CREA: TOPICAL_CREAM | Freq: Two times a day (BID) | CUTANEOUS | Status: DC

## 2012-02-20 MED ORDER — HYDROXYZINE HCL 25 MG PO TABS: 25.0000 mg | ORAL_TABLET | Freq: Four times a day (QID) | ORAL | Status: AC

## 2012-02-20 MED ORDER — TRIAMCINOLONE ACETONIDE 0.5 % EX OINT: TOPICAL_OINTMENT | Freq: Two times a day (BID) | CUTANEOUS | Status: DC

## 2012-02-20 NOTE — ED Notes (Addendum)
Pt with  rash started on hands yesterday last night noticed rash arms/ chest /stomach/vaginal area - pt had worked in garden night before - itching all over  Pt concerned about heart rate 48 started on flecainide

## 2012-02-20 NOTE — Telephone Encounter (Signed)
She feels better since Metoprolol has been decreased.  But is still tired.  Is now on 25mg  twice daily and a 50mg  Flecainde.

## 2012-02-20 NOTE — Discharge Instructions (Signed)
My impression is that you have allergic type of skin reaction. But also low thyroid can be causing your symptoms.  Take/use the prescribed medications as instructed. We will contact you if abnormal test results today. Also your heart rate although cough and symptoms appears low. I recommend that you call your cardiologist and make a followup appointment to discuss this issue. Go to the emergency department if symptoms like recurrent headache, dizziness, weakness, shortness of breath, palpitations, syncope or chest pain.

## 2012-02-20 NOTE — Telephone Encounter (Signed)
New problem:   Patient went to  urgent care today, due to rash &  heart rate was low.  Advise from urgent care to contacted her cardiology to adjust her medication.

## 2012-02-21 NOTE — Telephone Encounter (Signed)
Go to one tablet daily for one week then stop per Dr Ladona Ridgel.  Patient aware.  She will call me if her HR starts to go back up

## 2012-02-25 NOTE — ED Provider Notes (Signed)
History     CSN: [id number]  Arrival date & time 02/20/12  1103   First MD Initiated Contact with Patient 02/20/12 1114      Chief Complaint  Patient presents with  . Rash  . Bradycardia    (Consider location/radiation/quality/duration/timing/severity/associated sxs/prior treatment) HPI Comments: 41 y/o obese female with h/o heart a-fib, hypothyroidism and DM among other comorbidities. Here c/o:  1) Pruriginous rash in her entire body including her perineal area since last night. Rash started in hands and arms and spread all over. She had been working on her garden just before her rash started. Denies nausea or vomiting, no dizziness. No difficulty breathing or swallowing. No fever or chills. No abdominal pain or mucosal involvement. No new medications or known exposures.   2) Patient was found to have a heart rate 48-54 while here. She has been on flecainide for 2 months and metoprolol for longer time. Denies headaches, palpitations, dizziness, syncope, chest pain, shortness of breath or any other symptoms. Her thyroid was checked by an endocrinologist and her replacement medication dose has been recently changed.  3) Recurrent weeping rash with peeling and itchiness below breasts.   Past Medical History  Diagnosis Date  . Atrial fibrillation     2006  . Menorrhagia     2011  . Hypothyroid   . Diabetes mellitus   . Depression   . PCOS (polycystic ovarian syndrome)   . OSA (obstructive sleep apnea)     on CPAP  . Obesity (BMI 30-39.9)   . Chronic anxiety   . Vitamin d deficiency   . Allergic rhinitis   . Paroxysmal atrial fibrillation   . Prediabetes     with impared glucose tolerance.    Past Surgical History  Procedure Date  . Endometrial ablation   . Dilation and curettage, diagnostic / therapeutic   . Tonsillectomy 2007  . Bladder surgery 1997    interstitial cystitis   . Hand surgery 2006    due to cat bite  . Renal biopsy, open 2006  . Endometrial  ablation 08/2010    Family History  Problem Relation Age of Onset  . Cardiomyopathy Father   . Hypertension      fathers side  . Hyperlipidemia      fathers side  . Heart failure      both grandparents - paternal  . Coronary artery disease Maternal Grandmother   . Heart attack Maternal Grandmother     and other maternal family members  . Heart attack Maternal Grandfather     History  Substance Use Topics  . Smoking status: Former Smoker -- 2.0 packs/day for 16 years    Types: Cigarettes    Quit date: 09/03/1998  . Smokeless tobacco: Not on file  . Alcohol Use: Yes     maybe 2 a mth    OB History    Grav Para Term Preterm Abortions TAB SAB Ect Mult Living                  Review of Systems  Constitutional: Negative for fever, chills and fatigue.  HENT: Negative for congestion, sore throat, rhinorrhea, mouth sores, trouble swallowing, neck pain and neck stiffness.   Eyes: Negative for redness and itching.  Respiratory: Negative for cough and shortness of breath.   Cardiovascular: Negative for chest pain, palpitations and leg swelling.  Gastrointestinal: Negative for nausea, vomiting and diarrhea.  Genitourinary: Negative for dysuria and hematuria.  Musculoskeletal: Negative for arthralgias.  Skin:  Positive for rash.  Neurological: Negative for dizziness, light-headedness and headaches.  All other systems reviewed and are negative.    Allergies  Benadryl; Fish oil; Flecainide; Pneumococcal vaccines; and Sulfur  Home Medications   Current Outpatient Rx  Name Route Sig Dispense Refill  . CETIRIZINE HCL 10 MG PO TABS Oral Take 10 mg by mouth daily.      Marland Kitchen CITALOPRAM HYDROBROMIDE 20 MG PO TABS Oral Take 60 mg by mouth daily.    Marland Kitchen FLECAINIDE ACETATE 50 MG PO TABS Oral Take 1 tablet (50 mg total) by mouth 2 (two) times daily. 60 tablet 3  . CYTOMEL PO Oral Take 150 mcg by mouth daily.     Marland Kitchen METFORMIN HCL ER 500 MG PO TB24 Oral Take 500 mg by mouth 2 (two) times  daily.      Marland Kitchen METOPROLOL TARTRATE 50 MG PO TABS Oral Take 0.5 tablets (25 mg total) by mouth 2 (two) times daily. Take 1  tablet AM and 2 tablets PM 90 tablet 11  . SPIRONOLACTONE 25 MG PO TABS Oral Take 25 mg by mouth daily.    . ASPIRIN 325 MG PO TABS Oral Take 1 tablet (325 mg total) by mouth daily. 100 tablet 3  . HYDROXYZINE HCL 25 MG PO TABS Oral Take 1 tablet (25 mg total) by mouth every 6 (six) hours. 20 tablet 0  . NYSTATIN 100000 UNIT/GM EX CREA Topical Apply topically 2 (two) times daily. 30 g 0  . PREDNISONE 10 MG PO TABS  2 tabs po daily for 5 days 10 tablet 0  . TRIAMCINOLONE ACETONIDE 0.5 % EX OINT Topical Apply topically 2 (two) times daily. 30 g 0    BP 124/74  Pulse 48  Temp 98.4 F (36.9 C) (Oral)  Resp 18  SpO2 100%  LMP 02/19/2012  Physical Exam  Nursing note and vitals reviewed. Constitutional: She is oriented to person, place, and time. She appears well-developed and well-nourished. No distress.       obese  HENT:  Head: Normocephalic and atraumatic.  Right Ear: External ear normal.  Left Ear: External ear normal.  Mouth/Throat: Oropharynx is clear and moist. No oropharyngeal exudate.  Eyes: Conjunctivae are normal. Pupils are equal, round, and reactive to light. No scleral icterus.  Neck: No thyromegaly present.  Pulmonary/Chest: Effort normal and breath sounds normal. No respiratory distress. She has no wheezes. She has no rales.  Abdominal: Soft. Bowel sounds are normal. She exhibits no distension. There is no tenderness.       No hepatosplenomegaly.   Lymphadenopathy:    She has no cervical adenopathy.  Neurological: She is alert and oriented to person, place, and time.  Skin:       Theres is a maculopapular pruriginous rash generalized more in torso and extremities. But also affects the scalp. Includes red non blanchable punctates in torso and legs. No vesicles or ulcers. No mucosal, palms or soles involvement.  There is also erythema with mild  maceration below breasts.      ED Course  Procedures (including critical care time)   Labs Reviewed  CBC  TSH  LAB REPORT - SCANNED   No results found.   1. Allergic dermatitis   2. Bradycardia       MDM  EKG: with sinus bradycardia. Rate 44 bpm. No blocks. No ST changes.  Generalized rash impress related to allergic reaction but as appears petechial in torso decided to check CBC to assess for platelets. Prescribed prednisone,  triamcinolone and hydroxyzine. Bradicardia is currently asymptomatic with normal blood pressure, low heart rate likely related to medications. Asked to call for close follow up appointment with her cardiologist or go to the ED if dizziness or any new symptoms. Rechecked TSH. Impress candida related rash below breast prescribed nystatin cream.          Sharin Grave, MD 02/25/12 [phone]

## 2012-03-25 ENCOUNTER — Other Ambulatory Visit: Payer: Self-pay | Admitting: Obstetrics and Gynecology

## 2012-05-14 ENCOUNTER — Telehealth: Payer: Self-pay | Admitting: Internal Medicine

## 2012-05-14 DIAGNOSIS — I4891 Unspecified atrial fibrillation: Secondary | ICD-10-CM

## 2012-05-14 NOTE — Telephone Encounter (Signed)
New problem:  C/O on yesterday heart palp's. Middle of chest hurts today.

## 2012-05-14 NOTE — Telephone Encounter (Signed)
Please offer follow-up with one of our PAs/ NPs this week.

## 2012-05-14 NOTE — Telephone Encounter (Signed)
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

## 2012-05-15 NOTE — Telephone Encounter (Signed)
Spoke with pt, she is scheduled to see scott weaver pac on 05-23-12. She wants to have an echo the sameday due to work issues. Echo scheduled for 7:30am.

## 2012-05-20 ENCOUNTER — Ambulatory Visit: Payer: 59 | Admitting: Nurse Practitioner

## 2012-05-23 ENCOUNTER — Ambulatory Visit (INDEPENDENT_AMBULATORY_CARE_PROVIDER_SITE_OTHER): Payer: 59 | Admitting: Physician Assistant

## 2012-05-23 ENCOUNTER — Ambulatory Visit (HOSPITAL_COMMUNITY): Payer: 59 | Attending: Internal Medicine | Admitting: Radiology

## 2012-05-23 ENCOUNTER — Encounter: Payer: Self-pay | Admitting: Physician Assistant

## 2012-05-23 VITALS — BP 122/76 | HR 55 | Ht 66.0 in | Wt 279.1 lb

## 2012-05-23 DIAGNOSIS — I369 Nonrheumatic tricuspid valve disorder, unspecified: Secondary | ICD-10-CM | POA: Insufficient documentation

## 2012-05-23 DIAGNOSIS — N920 Excessive and frequent menstruation with regular cycle: Secondary | ICD-10-CM

## 2012-05-23 DIAGNOSIS — R002 Palpitations: Secondary | ICD-10-CM

## 2012-05-23 DIAGNOSIS — I4891 Unspecified atrial fibrillation: Secondary | ICD-10-CM

## 2012-05-23 DIAGNOSIS — R079 Chest pain, unspecified: Secondary | ICD-10-CM

## 2012-05-23 DIAGNOSIS — I059 Rheumatic mitral valve disease, unspecified: Secondary | ICD-10-CM | POA: Insufficient documentation

## 2012-05-23 DIAGNOSIS — E119 Type 2 diabetes mellitus without complications: Secondary | ICD-10-CM | POA: Insufficient documentation

## 2012-05-23 DIAGNOSIS — R001 Bradycardia, unspecified: Secondary | ICD-10-CM

## 2012-05-23 DIAGNOSIS — I498 Other specified cardiac arrhythmias: Secondary | ICD-10-CM

## 2012-05-23 MED ORDER — METOPROLOL TARTRATE 50 MG PO TABS: 12.5000 mg | ORAL_TABLET | Freq: Two times a day (BID) | ORAL | Status: DC

## 2012-05-23 NOTE — Progress Notes (Signed)
[address] [place], [place]  [address] Phone: [phone] Fax:  [phone]  Date:  05/23/2012   Name:  Desiree Schultz   DOB:  20-Oct-1970   MRN:  [id number]  PCP:  Jeani Hawking, MD  Primary Cardiologist:  Dr. [name]  Primary Electrophysiologist:  Dr. Hillis Range    History of Present Illness: Desiree Schultz is a 41 y.o. female who returns for evaluation of palpitations.   She has a history of paroxysmal atrial fibrillation, OSA, obesity, hypothyroidism, DM2.  Echo 2/11: EF 66%, normal diastolic function, mild MR, mild TR. The patient was placed on flecainide but had side effects. She was then started on multi-but did not tolerate this as well. She has been maintained on flecainide 50 mg daily. Last seen by Dr. Johney Frame 4/13. Of note, the patient declined anticoagulant therapy despite her high risk for stroke. She called in recently with palpitations.  She actually has a lot of concerns today.  First, she is concerned about her HR being low.  She feels fatigued.  This is a chronic problem that she relates to the metoprolol.  She takes either 25 mg or 50 mg of Metoprolol at bedtime.  She seems to choose the higher dose if she is having more palpitations.  Second, she had increased palpitations 2 weeks ago.  These seem to occur simultaneously with her menstrual cycle.  Once he cycle is complete, she feels better.  Denies tachy-palpitations like her prior AFib.  Symptom is more of a stop and start sensation. It takes her breath away.  Otherwise, she denies DOE, orthopnea, PND, edema.  Third, she did have an episode of chest pain recently that was substernal and persisted throughout the day.  No assoc symptoms.     Labs (4/12):  K 3.4, creatinine 0.9, Hgb 15 Labs (3/13):  ALT 24 Labs (6/13):  TSH 1.022   Wt Readings from Last 3 Encounters:  05/23/12 279 lb 1.9 oz (126.608 kg)  12/26/11 276 lb 1.9 oz (125.247 kg)  11/14/11 273 lb 12.8 oz (124.195 kg)      Past Medical History  Diagnosis Date  . Atrial fibrillation     2006  . Menorrhagia     2011  . Hypothyroid   . Diabetes mellitus   . Depression   . PCOS (polycystic ovarian syndrome)   . OSA (obstructive sleep apnea)     on CPAP  . Obesity (BMI 30-39.9)   . Chronic anxiety   . Vitamin d deficiency   . Allergic rhinitis   . Paroxysmal atrial fibrillation   . Prediabetes     with impared glucose tolerance.    Current Outpatient Prescriptions  Medication Sig Dispense Refill  . cetirizine (ZYRTEC) 10 MG tablet Take 10 mg by mouth daily.        . citalopram (CELEXA) 20 MG tablet Take 60 mg by mouth daily.      . Flaxseed, Linseed, (FLAX SEED OIL) 1000 MG CAPS Take 2,000 mg by mouth daily.      . flecainide (TAMBOCOR) 50 MG tablet Take 1 tablet (50 mg total) by mouth 2 (two) times daily.  60 tablet  3  . metFORMIN (GLUCOPHAGE-XR) 500 MG 24 hr tablet Take 1,000 mg by mouth 2 (two) times daily.       . metoprolol (LOPRESSOR) 50 MG tablet Take 50 mg by mouth 2 (two) times daily. Takes 25 mg or 50 mg per day  90 tablet  11  . nystatin cream (MYCOSTATIN) Apply topically 2 (two) times daily.  30 g  0  . spironolactone (ALDACTONE) 25 MG tablet Take 25 mg by mouth daily.      Marland Kitchen aspirin (BAYER ASPIRIN) 325 MG tablet Take 1 tablet (325 mg total) by mouth daily.  100 tablet  3  . DISCONTD: Cholecalciferol (VITAMIN D3) 10000 UNITS capsule Take 10,000 Units by mouth daily.          Allergies: Allergies  Allergen Reactions  . Benadryl (Diphenhydramine Hcl)   . Fish Oil   . Influenza Vaccines     Gives pt the flu and site of injection swells and hot  . Latex     Burns skin  . Sulfur     Bladder spasms, mouth raw  . Tape     Burns skin, band-aid, ekg stickers    History  Substance Use Topics  . Smoking status: Former Smoker -- 2.0 packs/day for 16 years    Types: Cigarettes    Quit date: 09/03/1998  . Smokeless tobacco: Not on file  . Alcohol Use: Yes     maybe 2 a mth      ROS:  Please see the history of present illness.   Episode of n/v recently likely related to ruptured ovarian cyst.  She complains of constipation.   All other systems reviewed and negative.   PHYSICAL EXAM: VS:  BP 122/76  Pulse 55  Ht 5\' 6"  (1.676 m)  Wt 279 lb 1.9 oz (126.608 kg)  BMI 45.05 kg/m2  Well nourished, well developed, in no acute distress HEENT: normal Neck: no JVD Vascular: no carotid bruits Cardiac:  normal S1, S2; RRR; no murmur Lungs:  clear to auscultation bilaterally, no wheezing, rhonchi or rales Abd: soft, nontender, no hepatomegaly Ext: no edema Skin: warm and dry Neuro:  CNs 2-12 intact, no focal abnormalities noted  EKG:  Sinus brady, HR 55, normal axis, PRWP, NSSTTW changes, QTc 413 ms      ASSESSMENT AND PLAN:  1. Palpitations:  She is likely describing PAC/PVCs.  These seem to be exacerbated by her menses.  We discussed limiting her caffeine intake and avoiding other stimulants.  Her endocrinologist follows her thyroid closely.  She only takes her beta blocker once a day.  She is somewhat symptomatic with the bradycardia.  I have recommended changing her metoprolol to a lower dose: 12.5 mg bid.  Hopefully she can take this consistently.  She can take 25 mg prn for palpitations.  Follow up with Dr. Hillis Range in 4-6 weeks.  2. Sinus Bradycardia:  As noted, decrease metoprolol dose.    3. Atrial Fibrillation:  She only takes Flecainide once daily.  She is in NSR today.  She has previously refused anticoagulation Rx.  4. Chest Pain:  Atypical.  However, she is a diabetic.  Since she is on Flecainide, I have recommended an ETT-Myoview.  Of note, she had an echo done today.  5. Menorrhagia:  This seems to be a big issue for her.  We had a long discussion regarding how to manage this.  I have asked her to follow up with her gynecologist to discuss further options.    Signed, Tereso Newcomer, PA-C  9:05 AM 05/23/2012

## 2012-05-23 NOTE — Progress Notes (Signed)
Echocardiogram performed.  

## 2012-05-23 NOTE — Patient Instructions (Addendum)
Your physician has recommended you make the following change in your medication:  CHANGE METOPROLOL TO ( 12.5 MG) TWICE A DAY AND TAKE EXTRA TABLET IF NEEDED FOR PALPITAITONS   Your physician has requested that you have en exercise stress myoview. DX: CHEST PAIN For further information please visit [email or web address] Please follow instruction sheet, as given.  Your physician recommends that you schedule a follow-up appointment WITH DR. ALLRED IN 4-6 WEEKS

## 2012-05-27 ENCOUNTER — Telehealth: Payer: Self-pay | Admitting: Internal Medicine

## 2012-05-27 NOTE — Telephone Encounter (Signed)
New problem:    Echo test results.   

## 2012-05-27 NOTE — Telephone Encounter (Signed)
Spoke with patient and gave her the results of echo  Told her if Dr Johney Frame wanted to make any changes after reading study I would let her know

## 2012-05-27 NOTE — Telephone Encounter (Signed)
Pt needs call back, i told her the preliminary report but that Dr Johney Frame needs to review and advise, pt understands she will get a call back.

## 2012-05-28 ENCOUNTER — Ambulatory Visit: Payer: 59 | Admitting: Pulmonary Disease

## 2012-06-09 ENCOUNTER — Ambulatory Visit (HOSPITAL_COMMUNITY): Payer: 59 | Attending: Cardiology | Admitting: Radiology

## 2012-06-09 VITALS — BP 112/71 | Ht 66.0 in | Wt 274.0 lb

## 2012-06-09 DIAGNOSIS — E119 Type 2 diabetes mellitus without complications: Secondary | ICD-10-CM | POA: Insufficient documentation

## 2012-06-09 DIAGNOSIS — R002 Palpitations: Secondary | ICD-10-CM | POA: Insufficient documentation

## 2012-06-09 DIAGNOSIS — R079 Chest pain, unspecified: Secondary | ICD-10-CM | POA: Insufficient documentation

## 2012-06-09 DIAGNOSIS — Z8249 Family history of ischemic heart disease and other diseases of the circulatory system: Secondary | ICD-10-CM | POA: Insufficient documentation

## 2012-06-09 DIAGNOSIS — R0602 Shortness of breath: Secondary | ICD-10-CM | POA: Insufficient documentation

## 2012-06-09 DIAGNOSIS — I4891 Unspecified atrial fibrillation: Secondary | ICD-10-CM

## 2012-06-09 MED ORDER — TECHNETIUM TC 99M SESTAMIBI GENERIC - CARDIOLITE
33.0000 | Freq: Once | INTRAVENOUS | Status: AC | PRN
  Administered 2012-06-09: 33 via INTRAVENOUS

## 2012-06-09 NOTE — Progress Notes (Signed)
[facility] SITE 3 NUCLEAR MED [id number] [place] [address] [phone]  Cardiology Nuclear Med Study  Desiree Schultz is a 41 y.o. female     MRN : [id number]     DOB: 21-Aug-1971  Procedure Date: 06/09/2012  Nuclear Med Background Indication for Stress Test:  Evaluation for Ischemia History:  Paroxysmal Afib 05/23/12 ECHO: EF: EF: 60-65%  Cardiac Risk Factors: Family History - CAD, History of Smoking, NIDDM and Obesity  Symptoms:  Chest Pain, Fatigue, Palpitations and SOB   Nuclear Pre-Procedure Caffeine/Decaff Intake:  None NPO After: 4:00am   Lungs:  clear O2 Sat: 96% on room air. IV 0.9% NS with Angio Cath:  22g  IV Site: R Hand  IV Started by:  Stanton Kidney, EMT-P  Chest Size (in):  44 Cup Size: G  Height: 5\' 6"  (1.676 m)  Weight:  274 lb (124.286 kg)  BMI:  Body mass index is 44.22 kg/(m^2). Tech Comments:  n/a    Nuclear Med Study 1 or 2 day study: 2 day  Stress Test Type:  Stress  Reading MD: Olga Millers, MD  Order Authorizing Provider:  J.Allred MD S.Weaver PA  Resting Radionuclide: Technetium 43m Sestamibi  Resting Radionuclide Dose: 33.0 mCi on 06/10/12   Stress Radionuclide:  Technetium 67m Sestamibi  Stress Radionuclide Dose: 33.0 mCi on 06/09/12           Stress Protocol Rest HR: 56 Stress HR: 160  Rest BP: 112/71 Stress BP: 153/62  Exercise Time (min): 9:46 METS: 10.80   Predicted Max HR: 178 bpm % Max HR: 89.89 bpm Rate Pressure Product: 62952   Dose of Adenosine (mg):  n/a Dose of Lexiscan: n/a mg  Dose of Atropine (mg): n/a Dose of Dobutamine: n/a mcg/kg/min (at max HR)  Stress Test Technologist: Milana Na, EMT-P  Nuclear Technologist:  Domenic Polite, CNMT     Rest Procedure:  Myocardial perfusion imaging was performed at rest 45 minutes following the intravenous administration of Technetium 30m Sestamibi. Rest ECG: Sinus Bradycardia  Stress Procedure:  The patient performed treadmill exercise  using a Bruce  Protocol for 9:46 minutes. The patient stopped due to fatigue and denied any chest pain.  There were no significant ST-T wave changes.  Technetium 48m Sestamibi was injected at peak exercise and myocardial perfusion imaging was performed after a brief delay. Stress ECG: No significant change from baseline ECG  QPS Raw Data Images:  Acquisition technically good; normal left ventricular size. Stress Images:  Normal homogeneous uptake in all areas of the myocardium. Rest Images:  Normal homogeneous uptake in all areas of the myocardium. Subtraction (SDS):  No evidence of ischemia. Transient Ischemic Dilatation (Normal <1.22):  1.07 Lung/Heart Ratio (Normal <0.45):  0.47  Quantitative Gated Spect Images QGS EDV:  114 ml QGS ESV:  50 ml  Impression Exercise Capacity:  Good exercise capacity. BP Response:  Normal blood pressure response. Clinical Symptoms:  No chest pain or dyspnea. ECG Impression:  No significant ST segment change suggestive of ischemia. Comparison with Prior Nuclear Study: No previous nuclear study performed  Overall Impression:  Normal stress nuclear study. LV Ejection Fraction: 57%.  LV Wall Motion:  NL LV Function; NL Wall Motion  Olga Millers

## 2012-06-10 ENCOUNTER — Ambulatory Visit (HOSPITAL_COMMUNITY): Payer: 59 | Attending: Cardiology

## 2012-06-10 DIAGNOSIS — R0989 Other specified symptoms and signs involving the circulatory and respiratory systems: Secondary | ICD-10-CM

## 2012-06-10 MED ORDER — TECHNETIUM TC 99M SESTAMIBI GENERIC - CARDIOLITE
33.0000 | Freq: Once | INTRAVENOUS | Status: AC | PRN
  Administered 2012-06-10: 33 via INTRAVENOUS

## 2012-06-12 ENCOUNTER — Telehealth: Payer: Self-pay | Admitting: *Deleted

## 2012-06-12 ENCOUNTER — Encounter: Payer: Self-pay | Admitting: Physician Assistant

## 2012-06-12 NOTE — Telephone Encounter (Signed)
Message copied by Burnell Blanks on Thu Jun 12, 2012 10:22 AM ------      Message from: [place], [place] T      Created: Thu Jun 12, 2012  8:48 AM       Please inform patient stress test normal.      Tereso Newcomer, PA-C  8:48 AM 06/12/2012

## 2012-06-12 NOTE — Telephone Encounter (Signed)
F/u  Nuclear stress test results

## 2012-06-12 NOTE — Telephone Encounter (Signed)
Advised patient

## 2012-06-12 NOTE — Telephone Encounter (Signed)
Advised patient  Please inform patient stress test normal. Tereso Newcomer, PA-C 8:48 AM 06/12/2012

## 2012-06-27 ENCOUNTER — Ambulatory Visit (INDEPENDENT_AMBULATORY_CARE_PROVIDER_SITE_OTHER): Payer: 59 | Admitting: Internal Medicine

## 2012-06-27 ENCOUNTER — Encounter: Payer: Self-pay | Admitting: Internal Medicine

## 2012-06-27 VITALS — BP 112/62 | HR 68 | Ht 66.0 in | Wt 274.0 lb

## 2012-06-27 DIAGNOSIS — I4891 Unspecified atrial fibrillation: Secondary | ICD-10-CM

## 2012-06-27 MED ORDER — METOPROLOL TARTRATE 25 MG PO TABS: 12.5000 mg | ORAL_TABLET | Freq: Two times a day (BID) | ORAL | Status: DC

## 2012-06-27 NOTE — Progress Notes (Signed)
PCP: Jeani Hawking, MD  Desiree Schultz is a 41 y.o. female who presents today for routine electrophysiology followup.  Since last being seen in our clinic, the patient reports doing very well.  She continues to have occasional palpitations.  These typically occur around her menstrual cycle.   She is doing much better on low dose metoprolol.   Today, she denies symptoms of chest pain, shortness of breath,  lower extremity edema, dizziness, presyncope, or syncope.  The patient is otherwise without complaint today.   Past Medical History  Diagnosis Date  . Atrial fibrillation     2006  . Menorrhagia     2011  . Hypothyroid   . Diabetes mellitus   . Depression   . PCOS (polycystic ovarian syndrome)   . OSA (obstructive sleep apnea)     on CPAP  . Obesity (BMI 30-39.9)   . Chronic anxiety   . Vitamin D deficiency   . Allergic rhinitis   . Paroxysmal atrial fibrillation   . Prediabetes     with impared glucose tolerance.  Marland Kitchen Hx of cardiovascular stress test     a. Myoview 10/13:  no ischemia; EF 57%   Past Surgical History  Procedure Date  . Endometrial ablation   . Dilation and curettage, diagnostic / therapeutic   . Tonsillectomy 2007  . Bladder surgery 1997    interstitial cystitis   . Hand surgery 2006    due to cat bite  . Renal biopsy, open 2006  . Endometrial ablation 08/2010    Current Outpatient Prescriptions  Medication Sig Dispense Refill  . aspirin (BAYER ASPIRIN) 325 MG tablet Take 1 tablet (325 mg total) by mouth daily.  100 tablet  3  . cetirizine (ZYRTEC) 10 MG tablet Take 10 mg by mouth daily.        . citalopram (CELEXA) 40 MG tablet Take 60 mg by mouth daily.      . Flaxseed, Linseed, (FLAX SEED OIL) 1000 MG CAPS Take 4,000 mg by mouth daily.       . flecainide (TAMBOCOR) 50 MG tablet Take 50 mg by mouth every evening.      Marland Kitchen levothyroxine (SYNTHROID, LEVOTHROID) 50 MCG tablet Take 50 mcg by mouth daily.      . metFORMIN (GLUCOPHAGE-XR) 500 MG 24 hr  tablet Take 1,000 mg by mouth 2 (two) times daily.       . metoprolol tartrate (LOPRESSOR) 25 MG tablet Take 12.5 mg by mouth 2 (two) times daily. And extra if needed for palpitations      . nystatin cream (MYCOSTATIN) Apply 1 application topically as needed.      Marland Kitchen spironolactone (ALDACTONE) 25 MG tablet Take 25 mg by mouth daily.      Marland Kitchen DISCONTD: Cholecalciferol (VITAMIN D3) 10000 UNITS capsule Take 10,000 Units by mouth daily.          Physical Exam: Filed Vitals:   06/27/12 0929  BP: 112/62  Pulse: 68  Height: 5\' 6"  (1.676 m)  Weight: 274 lb (124.286 kg)  SpO2: 96%    GEN- The patient is well appearing, alert and oriented x 3 today.   Head- normocephalic, atraumatic Eyes-  Sclera clear, conjunctiva pink Ears- hearing intact Oropharynx- clear Lungs- Clear to ausculation bilaterally, normal work of breathing Heart- Regular rate and rhythm, no murmurs, rubs or gallops, PMI not laterally displaced GI- soft, NT, ND, + BS Extremities- no clubbing, cyanosis, or edema  Recent normal myoview results reviewed with patient  Assessment and Plan:   Atrial fibrillation  Controlled with once daily flecainide and low dose metoprolol She failed medical therapy with multaq   No changes today Return in 1year

## 2012-06-27 NOTE — Patient Instructions (Addendum)
Your physician wants you to follow-up in: 12 months with Dr Allred You will receive a reminder letter in the mail two months in advance. If you don't receive a letter, please call our office to schedule the follow-up appointment.  

## 2012-08-12 ENCOUNTER — Telehealth: Payer: Self-pay | Admitting: Pulmonary Disease

## 2012-08-12 NOTE — Telephone Encounter (Signed)
Pt was last seen on 04/19/2011. No pending appointments. Would you want to see her before ordering a new mask or are you okay with just doing the order and changing her DME company. Please advise. Thanks!

## 2012-08-12 NOTE — Telephone Encounter (Signed)
She needs ROV first before order can be placed.

## 2012-08-12 NOTE — Telephone Encounter (Signed)
lmomtcb to schedule appt for the pt  

## 2012-08-14 ENCOUNTER — Telehealth: Payer: Self-pay | Admitting: Pulmonary Disease

## 2012-08-14 NOTE — Telephone Encounter (Signed)
lmomtcb to schedule appt for the pt

## 2012-08-14 NOTE — Telephone Encounter (Signed)
Pt states is is just very difficult for her to schedule appts due to her work schedule and it seems like Dr. Craige Cotta is never available when she can come. Pt wants appt before the end of the year because she has met her deductible this year. Pt states she is off on 08-22-12 and this is the only day she can be seen. Per TD ok for pt to see TP unless a face-to -face is needed then the pt has to see the MD.  So we called the pt home care company, [facility], and spoke with Angie and she states the pt insurance does not require a face-to-face so ok to see TP. Pt aware and OV set for 08-22-12. Carron Curie, CMA

## 2012-08-14 NOTE — Telephone Encounter (Signed)
Error - duplicate message

## 2012-08-14 NOTE — Telephone Encounter (Signed)
Patient is asking to switch doctors from Dr. Craige Cotta to Dr. Shelle Iron. She states with her work schedule trying to get in to see VS is too difficult.

## 2012-08-22 ENCOUNTER — Encounter: Payer: Self-pay | Admitting: Adult Health

## 2012-08-22 ENCOUNTER — Ambulatory Visit (INDEPENDENT_AMBULATORY_CARE_PROVIDER_SITE_OTHER): Payer: 59 | Admitting: Adult Health

## 2012-08-22 VITALS — BP 118/78 | HR 59 | Temp 98.9°F | Ht 66.0 in | Wt 276.4 lb

## 2012-08-22 DIAGNOSIS — G4733 Obstructive sleep apnea (adult) (pediatric): Secondary | ICD-10-CM

## 2012-08-22 NOTE — Progress Notes (Signed)
  Subjective:    Patient ID: Desiree Schultz, female    DOB: 07-Nov-1970, 41 y.o.   MRN: [id number]  HPI 41 yo female with moderate sleep apnea.  She had sleep test on 03/23/11>>AHI 3, RDI 16, SpO2 low 90%, bruxism, REM AHI 5, no supine sleep.  08/22/2012 Follow up  Returns for yearly follow up for OSA on nocturnal CPAP .  Doing well, wears everynight "never misses a night" , wears all night for 8-9 hr  Decreased daytime sleepiness.  Does feel mask is getting loose and feels too big lately , w/ occasional leak . Requests new mask order.  No chest pain, dypsnea , edema.      Review of Systems Constitutional:   No  weight loss, night sweats,  Fevers, chills, fatigue, or  lassitude.  HEENT:   No headaches,  Difficulty swallowing,  Tooth/dental problems, or  Sore throat,                No sneezing, itching, ear ache, nasal congestion, post nasal drip,   CV:  No chest pain,  Orthopnea, PND, swelling in lower extremities, anasarca, dizziness, palpitations, syncope.   GI  No heartburn, indigestion, abdominal pain, nausea, vomiting, diarrhea, change in bowel habits, loss of appetite, bloody stools.   Resp: No shortness of breath with exertion or at rest.  No excess mucus, no productive cough,  No non-productive cough,  No coughing up of blood.  No change in color of mucus.  No wheezing.  No chest wall deformity  Skin: no rash or lesions.  GU: no dysuria, change in color of urine, no urgency or frequency.  No flank pain, no hematuria   MS:  No joint pain or swelling.  No decreased range of motion.  No back pain.  Psych:  No change in mood or affect. No depression or anxiety.  No memory loss.         Objective:   Physical Exam GEN: A/Ox3; pleasant , NAD, obese   HEENT:  [place]/AT,  EACs-clear, TMs-wnl, NOSE-clear, THROAT-clear, no lesions, no postnasal drip or exudate noted.   NECK:  Supple w/ fair ROM; no JVD; normal carotid impulses w/o bruits; no thyromegaly or nodules palpated; no  lymphadenopathy.  RESP  Clear  P & A; w/o, wheezes/ rales/ or rhonchi.no accessory muscle use, no dullness to percussion  CARD:  RRR, no m/r/g  , no peripheral edema, pulses intact, no cyanosis or clubbing.  GI:   Soft & nt; nml bowel sounds; no organomegaly or masses detected.  Musco: Warm bil, no deformities or joint swelling noted.   Neuro: alert, no focal deficits noted.    Skin: Warm, no lesions or rashes         Assessment & Plan:

## 2012-08-22 NOTE — Patient Instructions (Addendum)
Continue on CPAP at bedtime and with naps. Continue on weight loss. We will send order for new mask to your home care company. Follow up with Dr. Craige Cotta in 6 months and as needed

## 2012-08-22 NOTE — Assessment & Plan Note (Signed)
Compensated on Nocturnal CPAP w/ great compliance  Download requested .  New order for mask .  Cont on weight loss.  follow up 6 months and As needed

## 2012-08-23 NOTE — Progress Notes (Signed)
Reviewed and agree with assessment/plan. 

## 2012-09-03 HISTORY — PX: REDUCTION MAMMAPLASTY: SUR839

## 2012-09-18 ENCOUNTER — Telehealth: Payer: Self-pay | Admitting: Internal Medicine

## 2012-09-18 ENCOUNTER — Other Ambulatory Visit: Payer: Self-pay | Admitting: Emergency Medicine

## 2012-09-18 MED ORDER — FLECAINIDE ACETATE 50 MG PO TABS: 50.0000 mg | ORAL_TABLET | Freq: Every evening | ORAL | Status: DC

## 2012-09-18 NOTE — Telephone Encounter (Signed)
Pt needs to know if she can do martial arts

## 2012-09-19 NOTE — Telephone Encounter (Signed)
Yes its okay per Dr Johney Frame [employer] is the name of the martial arts she is going to do

## 2013-01-28 ENCOUNTER — Telehealth: Payer: Self-pay | Admitting: Pulmonary Disease

## 2013-01-28 NOTE — Telephone Encounter (Signed)
Called pt to schd follow up apt. Left message x3. Sent Letter 01/28/13 °

## 2013-04-28 ENCOUNTER — Other Ambulatory Visit: Payer: Self-pay | Admitting: Obstetrics and Gynecology

## 2013-04-28 HISTORY — PX: OVARIAN CYST REMOVAL: SHX89

## 2013-05-15 ENCOUNTER — Ambulatory Visit (INDEPENDENT_AMBULATORY_CARE_PROVIDER_SITE_OTHER): Payer: 59 | Admitting: Adult Health

## 2013-05-15 ENCOUNTER — Encounter: Payer: Self-pay | Admitting: Adult Health

## 2013-05-15 VITALS — BP 134/86 | HR 73 | Temp 98.2°F | Ht 66.0 in | Wt 291.6 lb

## 2013-05-15 DIAGNOSIS — G4733 Obstructive sleep apnea (adult) (pediatric): Secondary | ICD-10-CM

## 2013-05-15 NOTE — Patient Instructions (Addendum)
Continue on CPAP at bedtime and with naps. We will send order for new mask and supplies  to your new  home care company Follow up with Dr. Craige Cotta in 6 months and as needed

## 2013-05-15 NOTE — Progress Notes (Signed)
  Subjective:    Patient ID: Desiree Schultz, female    DOB: Jul 12, 1971, 42 y.o.   MRN: [id number]  HPI 42 yo female with moderate sleep apnea.  She had sleep test on 03/23/11>>AHI 3, RDI 16, SpO2 low 90%, bruxism, REM AHI 5, no supine sleep.   05/15/2013  Follow up  Returns for follow up OSA. -9 months  Reports wearing CPAP everynight x 6-8hours.  requesting a new mask d/t face breaking out, hose, filter, etc.  would like to switch from [facility] to [facility] d/t location  Review of Systems Constitutional:   No  weight loss, night sweats,  Fevers, chills, fatigue, or  lassitude.  HEENT:   No headaches,  Difficulty swallowing,  Tooth/dental problems, or  Sore throat,                No sneezing, itching, ear ache, +nasal congestion, post nasal drip,   CV:  No chest pain,  Orthopnea, PND, swelling in lower extremities, anasarca, dizziness, palpitations, syncope.   GI  No heartburn, indigestion, abdominal pain, nausea, vomiting, diarrhea, change in bowel habits, loss of appetite, bloody stools.   Resp: No shortness of breath with exertion or at rest.  No excess mucus, no productive cough,  No non-productive cough,  No coughing up of blood.  No change in color of mucus.  No wheezing.  No chest wall deformity  Skin: no rash or lesions.  GU: no dysuria, change in color of urine, no urgency or frequency.  No flank pain, no hematuria   MS:  No joint pain or swelling.  No decreased range of motion.  No back pain.  Psych:  No change in mood or affect. No depression or anxiety.  No memory loss.         Objective:   Physical Exam  GEN: A/Ox3; pleasant , NAD, well nourished   HEENT:  [place]/AT,  EACs-clear, TMs-wnl, NOSE-clear, THROAT-clear, no lesions, no postnasal drip or exudate noted.   NECK:  Supple w/ fair ROM; no JVD; normal carotid impulses w/o bruits; no thyromegaly or nodules palpated; no lymphadenopathy.  RESP  Clear  P & A; w/o, wheezes/ rales/ or rhonchi.no accessory muscle  use, no dullness to percussion  CARD:  RRR, no m/r/g  , no peripheral edema, pulses intact, no cyanosis or clubbing.  GI:   Soft & nt; nml bowel sounds; no organomegaly or masses detected.  Musco: Warm bil, no deformities or joint swelling noted.   Neuro: alert, no focal deficits noted.    Skin: Warm, no lesions or rashes        Assessment & Plan:

## 2013-05-15 NOTE — Assessment & Plan Note (Signed)
Download requested Wt loss encouraged   Plan  Continue on CPAP at bedtime and with naps. We will send order for new mask and supplies  to your new  home care company Follow up with Dr. Craige Cotta in 6 months and as needed

## 2013-06-03 HISTORY — PX: BREAST REDUCTION SURGERY: SHX8

## 2013-07-04 ENCOUNTER — Other Ambulatory Visit: Payer: Self-pay | Admitting: Internal Medicine

## 2013-08-08 ENCOUNTER — Emergency Department (HOSPITAL_COMMUNITY)
Admission: EM | Admit: 2013-08-08 | Discharge: 2013-08-08 | Disposition: A | Discharge status: Home / Self Care | Payer: 59 | Source: Home / Self Care | Attending: Emergency Medicine | Admitting: Emergency Medicine

## 2013-08-08 ENCOUNTER — Encounter (HOSPITAL_COMMUNITY): Payer: Self-pay | Admitting: Emergency Medicine

## 2013-08-08 DIAGNOSIS — J019 Acute sinusitis, unspecified: Secondary | ICD-10-CM

## 2013-08-08 MED ORDER — FLUTICASONE PROPIONATE 50 MCG/ACT NA SUSP: 2.0000 | Freq: Every day | NASAL | Status: DC

## 2013-08-08 MED ORDER — AMOXICILLIN-POT CLAVULANATE 875-125 MG PO TABS: 1.0000 | ORAL_TABLET | Freq: Two times a day (BID) | ORAL | Status: DC

## 2013-08-08 NOTE — ED Notes (Signed)
Pt c/o colds/sinus infection onset 10 days Sxs include: congestion, cough w/green phlegm, HA, ST Denies: f/v/n/d Alert w/no signs of acute distress.

## 2013-08-08 NOTE — ED Provider Notes (Signed)
Chief Complaint:   Chief Complaint  Patient presents with  . URI    History of Present Illness:   Desiree Schultz is a 42 year old female who's had a ten-day history of nasal congestion with bloody tract drainage, headache, sinus pressure, aching in her teeth, aching behind her eyes, and ear congestion. She has had a cough productive of green to brown sputum, sore throat, postnasal drip, and diarrhea. She states her partner had a similar cold. She's tried over-the-counter medications without relief.  Review of Systems:  Other than noted above, the patient denies any of the following symptoms: Systemic:  No fevers, chills, sweats, weight loss or gain, fatigue, or tiredness. Eye:  No redness or discharge. ENT:  No ear pain, drainage, headache, nasal congestion, drainage, sinus pressure, difficulty swallowing, or sore throat. Neck:  No neck pain or swollen glands. Lungs:  No cough, sputum production, hemoptysis, wheezing, chest tightness, shortness of breath or chest pain. GI:  No abdominal pain, nausea, vomiting or diarrhea.  PMFSH:  Past medical history, family history, social history, meds, and allergies were reviewed. She's allergic to sulfa. She has allergies, atrial fibrillation, and hypothyroidism. She takes Zyrtec, metoprolol, Synthroid, spironolactone, and Celexa.  Physical Exam:   Vital signs:  BP 124/82  Pulse 62  Temp(Src) 99.1 F (37.3 C) (Oral)  Resp 20  SpO2 96% General:  Alert and oriented.  In no distress.  Skin warm and dry. Eye:  No conjunctival injection or drainage. Lids were normal. ENT:  TMs and canals were normal, without erythema or inflammation.  Nasal mucosa was clear and uncongested, without drainage.  Mucous membranes were moist.  Pharynx was clear with no exudate or drainage.  There were no oral ulcerations or lesions. Neck:  Supple, no adenopathy, tenderness or mass. Lungs:  No respiratory distress.  Lungs were clear to auscultation, without wheezes, rales or  rhonchi.  Breath sounds were clear and equal bilaterally.  Heart:  Regular rhythm, without gallops, murmers or rubs. Skin:  Clear, warm, and dry, without rash or lesions.  Assessment:  The encounter diagnosis was Acute sinusitis.  Plan:   1.  Meds:  The following meds were prescribed:   Discharge Medication List as of 08/08/2013  9:36 AM    START taking these medications   Details  amoxicillin-clavulanate (AUGMENTIN) 875-125 MG per tablet Take 1 tablet by mouth 2 (two) times daily., Starting 08/08/2013, Until Discontinued, Normal    fluticasone (FLONASE) 50 MCG/ACT nasal spray Place 2 sprays into both nostrils daily., Starting 08/08/2013, Until Discontinued, Normal        2.  Patient Education/Counseling:  The patient was given appropriate handouts, self care instructions, and instructed in symptomatic relief.  Suggested hailing stain, hot compresses to face, and elevation of the head of the bed.  3.  Follow up:  The patient was told to follow up if no better in 3 to 4 days, if becoming worse in any way, and given some red flag symptoms such as increasing pain or worsening fever which would prompt immediate return.  Follow up here as necessary.      Reuben Likes, MD 08/08/13 1051

## 2013-10-20 ENCOUNTER — Other Ambulatory Visit: Payer: Self-pay

## 2013-10-20 MED ORDER — METOPROLOL TARTRATE 25 MG PO TABS: ORAL_TABLET | ORAL | Status: DC

## 2014-02-05 ENCOUNTER — Telehealth: Payer: Self-pay | Admitting: Internal Medicine

## 2014-02-05 NOTE — Telephone Encounter (Signed)
New Prob    Pt is requesting to switch to Dr. Olin Pia care. She is a current pt of Dr. Rayann Heman.

## 2014-02-05 NOTE — Telephone Encounter (Signed)
Patient has afib and has not seen Dr Rayann Heman since 2013

## 2014-02-10 ENCOUNTER — Encounter: Payer: Self-pay | Admitting: *Deleted

## 2014-02-10 NOTE — Telephone Encounter (Signed)
Ok, per Dr. Caryl Comes. Will send to [facility] (EP scheduler) to schedule OV with Caryl Comes.

## 2014-02-11 ENCOUNTER — Ambulatory Visit (INDEPENDENT_AMBULATORY_CARE_PROVIDER_SITE_OTHER): Payer: BC Managed Care – PPO | Admitting: Internal Medicine

## 2014-02-11 ENCOUNTER — Encounter: Payer: Self-pay | Admitting: Internal Medicine

## 2014-02-11 VITALS — BP 138/91 | HR 65 | Ht 66.0 in | Wt 296.0 lb

## 2014-02-11 DIAGNOSIS — I4891 Unspecified atrial fibrillation: Secondary | ICD-10-CM

## 2014-02-11 DIAGNOSIS — E282 Polycystic ovarian syndrome: Secondary | ICD-10-CM

## 2014-02-11 DIAGNOSIS — E119 Type 2 diabetes mellitus without complications: Secondary | ICD-10-CM

## 2014-02-11 LAB — BASIC METABOLIC PANEL
BUN: 17 mg/dL (ref 6–23)
CO2: 27 mEq/L (ref 19–32)
Calcium: 8.6 mg/dL (ref 8.4–10.5)
Chloride: 105 mEq/L (ref 96–112)
Creatinine, Ser: 0.8 mg/dL (ref 0.4–1.2)
GFR: 79.21 mL/min (ref >60.00)
Glucose, Bld: 86 mg/dL (ref 70–99)
POTASSIUM: 3.9 meq/L (ref 3.5–5.1)
SODIUM: 137 meq/L (ref 135–145)

## 2014-02-11 LAB — HEMOGLOBIN A1C: HEMOGLOBIN A1C: 6.1 % (ref 4.6–6.5)

## 2014-02-11 MED ORDER — APIXABAN 5 MG PO TABS: 5.0000 mg | ORAL_TABLET | Freq: Two times a day (BID) | ORAL | Status: DC

## 2014-02-11 NOTE — Progress Notes (Signed)
ELECTROPHYSIOLOGY CONSULT NOTE  Patient ID: Desiree Schultz, MRN: [id number], DOB/AGE: 44-Nov-1972 44 y.o. Admit date: (Not on file) Date of Consult: 02/11/2014  Primary Physician: Cyril Mourning, MD Primary Cardiologist: new  Chief Complaint: Afib   HPI Desiree Schultz is a 43 y.o. female \ w hx of OSA Rx CPAP  ; obesity, and PAFib Rx  This may date back to 2006  This ws confirmed by monitoring 2012   She has symptomatic tachypalpitations that occur around the time of her menses and are associated with profound dyspnea for further to become sedentary. There is no associated lightheadedness. These episodes typically last 5-15 minutes.  She has occasional peripheral edema. She does not have orthopnea or nocturnal dyspnea. She denies exertional chest pain or syncope.    Echo 2013 normal LV function with LAE 47/2.0 with LVdilitation 53 (ED) and 36(ESystolic)  TERF   Gender and  HTN (BP 135 on BB and Aldactone) her chart as listed her as having diabetes. She denies this. I have removed her from her past medical history.     Past Medical History  Diagnosis Date  . Atrial fibrillation     2006  . Menorrhagia     2011  . Hypothyroid   . Prediabetes   . Depression   . PCOS (polycystic ovarian syndrome)   . OSA (obstructive sleep apnea)     on CPAP  . Obesity (BMI 30-39.9)   . Chronic anxiety   . Vitamin D deficiency   . Allergic rhinitis   . Hx of cardiovascular stress test     a. Myoview 10/13:  no ischemia; EF 57%  . Elevated blood pressure     On therapy. Probably hypertension      Surgical History:  Past Surgical History  Procedure Laterality Date  . Endometrial ablation    . Dilation and curettage, diagnostic / therapeutic    . Tonsillectomy  2007  . Bladder surgery  1997    interstitial cystitis   . Hand surgery  2006    due to cat bite  . Renal biopsy, open  2006  . Endometrial ablation  08/2010  . Ovarian cyst removal  04/28/13     Home Meds: Prior  to Admission medications   Medication Sig Start Date End Date Taking? Authorizing Provider  aspirin (BAYER ASPIRIN) 325 MG tablet Take 1 tablet (325 mg total) by mouth daily. 01/08/11 07/28/19  Fay Records, MD  cetirizine (ZYRTEC) 10 MG tablet Take 10 mg by mouth daily.      Historical Provider, MD  citalopram (CELEXA) 40 MG tablet 1 and 1/2 tabs once daily (=60mg )    Historical Provider, MD  levothyroxine (SYNTHROID) 137 MCG tablet Take 137 mcg by mouth daily before breakfast.    Historical Provider, MD  metoprolol tartrate (LOPRESSOR) 25 MG tablet Take 1/2 tablet by mouth 2 times daily and extra if needed for palpitations. 10/20/13   Thompson Grayer, MD  spironolactone (ALDACTONE) 50 MG tablet Take 50 mg by mouth daily.    Historical Provider, MD       Allergies:  Allergies  Allergen Reactions  . Benadryl [Diphenhydramine Hcl]   . Fish Oil   . Influenza Vaccines     Gives pt the flu and site of injection swells and hot  . Latex     Burns skin  . Sulfur     Bladder spasms, mouth raw  . Tape     Burns skin, band-aid, ekg  stickers    History   Social History  . Marital Status: Single    Spouse Name: N/A    Number of Children: N/A  . 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 Smoking status: Former Smoker -- 2.00 packs/day for 16 years    Types: Cigarettes    Quit date: 09/03/1998  . Smokeless tobacco: Not on file  . Alcohol Use: Yes     Comment: maybe 2 a mth  . Drug Use: Yes    Special: Marijuana     Comment: in high school -- none since  . Sexual Activity: Not on file   Other Topics Concern  . Not on file   Social History Narrative   Pt lives in [place] with friend.  Works as a Designer, jewellery     Family History  Problem Relation Age of Onset  . Cardiomyopathy Father   . Heart disease Father   . Hyperlipidemia Father   . Hypertension Father   . Hypertension      fathers side  . Hyperlipidemia       fathers side  . Heart failure      both grandparents - paternal  . Coronary artery disease Maternal Grandmother   . Heart attack Maternal Grandmother     and other maternal family members  . Heart attack Maternal Grandfather      ROS:  Please see the history of present illness.   Negative except tinnitus  All other systems reviewed and negative.    Physical Exam:   Blood pressure 138/91, pulse 65, height 5\' 6"  (1.676 m), weight 296 lb (134.265 kg). General: Well developed, well nourished obese female in no acute distress. Head: Normocephalic, atraumatic, sclera non-icteric, no xanthomas, nares are without discharge. EENT: normal Lymph Nodes:  none Back: without scoliosis/kyphosis, no CVA tendersness Neck: Negative for carotid bruits. JVD not elevated. Lungs: Clear bilaterally to auscultation without wheezes, rales, or rhonchi. Breathing is unlabored. Heart: RRR with S1 S2. No  murmur , rubs, or gallops appreciated. Abdomen: Soft, non-tender, non-distended with normoactive bowel sounds. No hepatomegaly. No rebound/guarding. No obvious abdominal masses. Msk:  Strength and tone appear normal for age. Extremities: No clubbing or cyanosis. No * edema.  Distal pedal pulses are 2+ and equal bilaterally. Skin: Warm and Dry Neuro: Alert and oriented X 3. CN III-XII intact Grossly normal sensory and motor function . Psych:  Responds to questions appropriately with a normal affect.      Labs: Cardiac Enzymes No results found for this basename: CKTOTAL, CKMB, TROPONINI,  in the last 72 hours CBC Lab Results  Component Value Date   WBC 10.4 02/20/2012   HGB 13.8 02/20/2012   HCT 40.7 02/20/2012   MCV 91.3 02/20/2012   PLT 285 02/20/2012   PROTIME: No results found for this basename: LABPROT, INR,  in the last 72 hours Chemistry No results found for this basename: NA, K, CL, CO2, BUN, CREATININE, CALCIUM, LABALBU, PROT, BILITOT, ALKPHOS, ALT, AST, GLUCOSE,  in the last 168 hours Lipids No  results found for this basename: CHOL,  HDL,  LDLCALC,  TRIG   BNP No results found for this basename: probnp   Miscellaneous No results found for this basename: DDIMER    Radiology/Studies:  No results found.  EKG: Sinus rhythm at 65 Interval 17/08/42  Assessment and Plan:   Atrial fibrillation-paroxysmal  Polycystic ovarian syndrome  Hypertension  Obstructive sleep  apnea on CPAP  Fatigue-beta blockers  Left atrial enlargement left ventricular dilatation  The patient has symptomatic paroxysms of atrial fibrillation that occur around the time of her menses. She struggled in the past with fatigue related to beta blocker/flecainide. I suspect this is more likely related to her beta blocker as she has had down titrated.  Echocardiogram had shown left atrial enlargement and mild left ventricular dilatation. We will reassess her left ventricular and atrial size and function as it will inform decisions related to antiarrhythmic therapy  We will discontinue her aspirin. Her CHADS-VASc score is 2. We will begin her on apixaban We discussed the use of the NOACs compared to Coumadin. We briefly reviewed the data of at least comparability in stroke prevention, bleeding and outcome. We discussed some of the new once wherein somewhat associated with decreased ischemic stroke risk, one to be taken daily, and has been shown to be comparable and bleeding risk to aspirin.  We also discussed bleeding associated with warfarin as well as NOACs and a wall bleeding as a complication of all these drugs intracranial bleeding is more frequently associated with warfarin then the NOACs and a GI bleeding is more commonly associated with the latter  Give her prescriptions for different beta blockers, atenolol, metoprolol succinate and bisoprolol, in the hopes that we can find one that does not aggravate fatigue.  She is on high-dose Aldactone. We'll check a potassium level. We'll also check her fasting  blood sugar and she has a history of prediabetes.   Virl Axe

## 2014-02-11 NOTE — Patient Instructions (Addendum)
Your physician has recommended you make the following change in your medication:  1) STOP Aspirin 2) START Eliquis 5mg  twice daily  You have been given prescriptions for 3 beta blocker medications --- DO NOT TAKE THESE AT THE SAME TIME, TRY ONLY ONE AT A TIME FOR 30 DAYS 1) Metoprolol Succinate 25 mg daily for 1 month -- if no improvement in fatigue then stop this medication and start #2 2) Atenolol 25 mg daily for 1 month  -- if no improvement in fatigue then stop this medication and start #3 3) Bisoprolol 2.5 mg daily for 1 month  Please call us with medication that works best for you  Your physician has requested that you have an echocardiogram. Echocardiography is a painless test that uses sound waves to create images of your heart. It provides your doctor with information about the size and shape of your heart and how well your heart's chambers and valves are working. This procedure takes approximately one hour. There are no restrictions for this procedure.  Your physician recommends that you schedule a follow-up appointment in: 6 weeks with Dr. Caryl Comes.  Labs today: BMET, A1C

## 2014-02-12 ENCOUNTER — Telehealth: Payer: Self-pay | Admitting: *Deleted

## 2014-02-12 NOTE — Telephone Encounter (Signed)
Patient is out of her spironolactone, and was wanting to know if Dr Caryl Comes wants her to continue taking it since he had mentioned that she might get to stop taking it. Please advise. Thanks, MI

## 2014-02-15 MED ORDER — SPIRONOLACTONE 50 MG PO TABS: 50.0000 mg | ORAL_TABLET | Freq: Every day | ORAL | Status: DC

## 2014-02-15 NOTE — Telephone Encounter (Signed)
Yes she is to continue taking her Spironolactone. Any med changes will be addressed at the next office visit. Spoke with patient, informed her of instructions to remain on the medication. She is agreeable to plan.

## 2014-02-17 ENCOUNTER — Encounter: Payer: Self-pay | Admitting: Internal Medicine

## 2014-02-17 NOTE — Telephone Encounter (Signed)
Follow Up   Pt calling following up of test results. Please call.

## 2014-02-18 ENCOUNTER — Other Ambulatory Visit: Payer: Self-pay | Admitting: Obstetrics and Gynecology

## 2014-02-18 NOTE — Telephone Encounter (Signed)
This encounter was created in error - please disregard.

## 2014-02-19 LAB — CYTOLOGY - PAP

## 2014-02-22 ENCOUNTER — Institutional Professional Consult (permissible substitution): Payer: 59 | Admitting: Internal Medicine

## 2014-02-22 ENCOUNTER — Ambulatory Visit (HOSPITAL_COMMUNITY)
Admission: RE | Admit: 2014-02-22 | Discharge: 2014-02-22 | Disposition: A | Discharge status: Home / Self Care | Payer: BC Managed Care – PPO | Source: Ambulatory Visit | Attending: Internal Medicine | Admitting: Internal Medicine

## 2014-02-22 DIAGNOSIS — I4891 Unspecified atrial fibrillation: Secondary | ICD-10-CM | POA: Insufficient documentation

## 2014-02-22 DIAGNOSIS — I059 Rheumatic mitral valve disease, unspecified: Secondary | ICD-10-CM

## 2014-02-22 NOTE — Progress Notes (Signed)
2D Echo Performed 02/22/2014    Tammie Crouch, RCS  

## 2014-02-25 ENCOUNTER — Ambulatory Visit: Payer: BC Managed Care – PPO | Admitting: Cardiology

## 2014-02-26 ENCOUNTER — Telehealth: Payer: Self-pay | Admitting: *Deleted

## 2014-02-26 NOTE — Telephone Encounter (Signed)
Pt notified about normal echo per Dr. Caryl Comes. I advised pt that per Dr. Caryl Comes she could start Flecainide 100 mg twice daily or when she is due for her period. Pt said she does not want to add another med right now, said had problems with flecainide in the past and would like to see if the toprol xl helps first, said Dr. Caryl Comes will probably have to increase her Toprol XL.  I advised pt I will let Dr. Caryl Comes and his nurse Laurence Aly. RN know this. Pt then states "so my heart is not enlarged". I advised  Dr. Caryl Comes did not make mention of her heart beign enlarged. I advised ejection fraction was in the range of 55% to 60%. Wall motion was normal; there were no regional wall motion abnormalities. I advise pt that was good, heart function nice and strong. I tried to reassure pt that if Dr. Caryl Comes something severely wrong or abnormal he would let her know.   Pt states she is very concerned because her father when he was her age had the same heart problems as she is having now. States that by the time her father was 52 he was dx with cardiomyopathy. She states her father takes 13 meds in the morning,13 meds in the evening. Pt has f/u with Dr. Caryl Comes 04/02/14 8 am, but she asked if there was anything sooner. I looked and told her Dr. Caryl Comes was booked solid. I did reassure pt that I will let Dr. Caryl Comes and his nurse Venida Jarvis know of her concerns. I advised pt to discuss her concerns with Dr. Caryl Comes at f/u. Pt said she would. I again tried to reassure pt that if Dr. Caryl Comes saw something severely wrong with her heart he would let her know however; I again did tell I will let Dr. Caryl Comes and his nurse Sherri aware of her concerns. Pt said thank you very much. Pt said she does not want to end up with cardiomyopathy like her father. Pt very anxious.

## 2014-03-05 ENCOUNTER — Other Ambulatory Visit: Payer: Self-pay | Admitting: Internal Medicine

## 2014-03-08 ENCOUNTER — Telehealth: Payer: Self-pay | Admitting: *Deleted

## 2014-03-08 NOTE — Telephone Encounter (Signed)
Informed patient that I would be reviewing this with Dr. Caryl Comes tomorrow and will get back with her.  Explained that normally we would perform GXT testing post Flecainide start and that I would discuss this with Dr. Caryl Comes first, before sending rx.  She is agreeable to this.

## 2014-03-08 NOTE — Telephone Encounter (Signed)
Patient requests flecainide refill sent to [facility] [address]. Please advise. Thanks, MI

## 2014-03-08 NOTE — Telephone Encounter (Signed)
New message    Patient request flecainide refill to be sent [facility] on [address].

## 2014-03-10 MED ORDER — FLECAINIDE ACETATE 100 MG PO TABS: 100.0000 mg | ORAL_TABLET | Freq: Two times a day (BID) | ORAL | Status: DC

## 2014-03-11 ENCOUNTER — Other Ambulatory Visit: Payer: Self-pay | Admitting: Internal Medicine

## 2014-04-02 ENCOUNTER — Encounter: Payer: Self-pay | Admitting: Internal Medicine

## 2014-04-02 ENCOUNTER — Ambulatory Visit (INDEPENDENT_AMBULATORY_CARE_PROVIDER_SITE_OTHER)
Admission: RE | Admit: 2014-04-02 | Discharge: 2014-04-02 | Disposition: A | Discharge status: Home / Self Care | Payer: BC Managed Care – PPO | Source: Ambulatory Visit | Attending: Internal Medicine | Admitting: Internal Medicine

## 2014-04-02 ENCOUNTER — Ambulatory Visit (INDEPENDENT_AMBULATORY_CARE_PROVIDER_SITE_OTHER): Payer: BC Managed Care – PPO | Admitting: Internal Medicine

## 2014-04-02 VITALS — BP 128/75 | HR 56 | Ht 66.0 in | Wt 294.0 lb

## 2014-04-02 DIAGNOSIS — I428 Other cardiomyopathies: Secondary | ICD-10-CM

## 2014-04-02 DIAGNOSIS — I420 Dilated cardiomyopathy: Secondary | ICD-10-CM

## 2014-04-02 DIAGNOSIS — I4891 Unspecified atrial fibrillation: Secondary | ICD-10-CM

## 2014-04-02 DIAGNOSIS — I480 Paroxysmal atrial fibrillation: Secondary | ICD-10-CM

## 2014-04-02 MED ORDER — FLECAINIDE ACETATE 100 MG PO TABS: 50.0000 mg | ORAL_TABLET | Freq: Two times a day (BID) | ORAL | Status: DC

## 2014-04-02 NOTE — Patient Instructions (Signed)
Your physician has recommended you make the following change in your medication:  1) DECREASE Flecainide to 50 mg twice daily  Your physician has requested that you have CT calcium score test.  Your physician recommends that you schedule a follow-up appointment in: 4 months with Dr. Caryl Comes.   Nira Conn, RN will contact you to arrange genetic testing.

## 2014-04-02 NOTE — Progress Notes (Signed)
Patient Care Team: Cyril Mourning, MD as PCP - General (Obstetrics and Gynecology) Limmie Patricia, MD as Attending Physician (Endocrinology) Thompson Grayer, MD as Attending Physician (Cardiology)   HPI  Desiree Schultz is a 43 y.o. female Seen in followup for atrial fibrillation occurring in the context of polycystic ovarian syndrome. She was started on apixaban. It was her impression that atrial fibrillation track of her period.     She also had echocardiographic evidence of LVE/L AE  Repeat echocardiogram demonstrated left ventricular enlargement with end-diastolic parameters of 16.1/09.6 (36.1/53) there is no wall thickening. Left atrial dimensions were borderline enlarged in one dimension and normal into dimension (46/1.78)  There is a family history of cardiomyopathy  She takes aldactone for her PCOS  She is now having chest pain since taking flecainide  This occurs with and withut exertion        Past Medical History  Diagnosis Date  . Atrial fibrillation     2006  . Menorrhagia     2011  . Hypothyroid   . Prediabetes   . Depression   . PCOS (polycystic ovarian syndrome)   . OSA (obstructive sleep apnea)     on CPAP  . Obesity (BMI 30-39.9)   . Chronic anxiety   . Vitamin D deficiency   . Allergic rhinitis   . Hx of cardiovascular stress test     a. Myoview 10/13:  no ischemia; EF 57%  . Elevated blood pressure     On therapy. Probably hypertension    Past Surgical History  Procedure Laterality Date  . Endometrial ablation    . Dilation and curettage, diagnostic / therapeutic    . Tonsillectomy  2007  . Bladder surgery  1997    interstitial cystitis   . Hand surgery  2006    due to cat bite  . Renal biopsy, open  2006  . Endometrial ablation  08/2010  . Ovarian cyst removal  04/28/13    Current Outpatient Prescriptions  Medication Sig Dispense Refill  . apixaban (ELIQUIS) 5 MG TABS tablet Take 1 tablet (5 mg total) by mouth 2 (two)  times daily.  60 tablet  0  . cetirizine (ZYRTEC) 10 MG tablet Take 10 mg by mouth daily.        . citalopram (CELEXA) 40 MG tablet 1 and 1/2 tabs once daily (=60mg )      . flecainide (TAMBOCOR) 100 MG tablet Take 1 tablet (100 mg total) by mouth 2 (two) times daily.  60 tablet  1  . levothyroxine (SYNTHROID) 137 MCG tablet Take 137 mcg by mouth daily before breakfast.      . metoprolol succinate (TOPROL-XL) 25 MG 24 hr tablet TAKE 1 TABLET BY MOUTH DAILY  30 tablet  3  . spironolactone (ALDACTONE) 50 MG tablet Take 1 tablet (50 mg total) by mouth daily.  30 tablet  2  . [DISCONTINUED] Cholecalciferol (VITAMIN D3) 10000 UNITS capsule Take 10,000 Units by mouth daily.         No current facility-administered medications for this visit.    Allergies  Allergen Reactions  . Benadryl [Diphenhydramine Hcl]   . Fish Oil   . Influenza Vaccines     Gives pt the flu and site of injection swells and hot  . Latex     Burns skin  . Sulfur     Bladder spasms, mouth raw  . Tape     Burns skin, band-aid, ekg stickers  Review of Systems negative except from HPI and PMH  Physical Exam BP 128/75  Pulse 56  Ht 5\' 6"  (1.676 m)  Wt 294 lb (133.358 kg)  BMI 47.48 kg/m2 Well developed and well nourished in no acute distress HENT normal E scleral and icterus clear Neck Supple JVP flat; carotids brisk and full Clear to ausculation  Regular rate and rhythm, no murmurs gallops or rub Soft with active bowel sounds No clubbing cyanosis  Edema Alert and oriented, grossly normal motor and sensory function Skin Warm and Dry  ECG  SR 58 19/09/42  Assessment and  Plan  Paroxysmal atrial fibrillation   sinus bradycardia  Left ventricular enlargement  Chest pain   Polycystic ovarian syndrome  Hypertension  Obstructive sleep apnea Number number of issues. The first is that she is not tolerating flecainide well because of fatigue. She is taking it once or twice a day. We will decrease it to  50 twice a day and she tolerates.  In the event that she cannot tolerate this, we could try propafenone although I worry that his beta blocker effect. Alternatively we could use dofetilide  She is having chest discomfort. This is relatively new. The context of her flecainide, it requires exclusion of coronary disease. Options include Myoview scanning versus calcium scoring. We will pursue the latter.  She is concerned about obesity. I suggested she consider obesity reduction surgery as well as pursuing her exercises she is currently doing.  In her family history of cardiomyopathy, we discussed potential value obtained testing. She would like to pursue this. We have discussed the possible negative impact in terms labeling etc.  Significant costs are associated with procedure care she is to get a transfer her care at [facility]

## 2014-04-06 ENCOUNTER — Telehealth: Payer: Self-pay | Admitting: Internal Medicine

## 2014-04-06 NOTE — Telephone Encounter (Signed)
°  Patient has been taking Flecinide. Her heart rate is running in the 60's. She is very sleepy and sluggish. On a normal day she can ride her bike 13 miles. She was only able to ride it 5 miles yesterday and had to get someone to come and get her. She stopped the medication today and wants something different. Please call and advise.

## 2014-04-08 MED ORDER — PROPAFENONE HCL 225 MG PO TABS: 225.0000 mg | ORAL_TABLET | Freq: Two times a day (BID) | ORAL | Status: DC

## 2014-04-08 NOTE — Telephone Encounter (Signed)
Advised pt to start Propafenone 225 mg BID. Patient is agreeable. Rx sent to pharmacy for 30 days - per pt request, to see how medication does.

## 2014-04-09 ENCOUNTER — Telehealth: Payer: Self-pay | Admitting: Internal Medicine

## 2014-04-09 NOTE — Telephone Encounter (Signed)
Spoke with patient and let her know her Ca Score is 0 but it did show airspace in lungs.  Suggested a CT of lungs for better pictures.  She is aware and will await Dr Olin Pia recommendations

## 2014-04-09 NOTE — Telephone Encounter (Signed)
New message     Pt want CT results

## 2014-04-12 ENCOUNTER — Ambulatory Visit: Payer: BC Managed Care – PPO | Admitting: Pulmonary Disease

## 2014-04-12 ENCOUNTER — Encounter: Payer: Self-pay | Admitting: Pulmonary Disease

## 2014-04-12 ENCOUNTER — Encounter (INDEPENDENT_AMBULATORY_CARE_PROVIDER_SITE_OTHER): Payer: Self-pay

## 2014-04-12 ENCOUNTER — Ambulatory Visit (INDEPENDENT_AMBULATORY_CARE_PROVIDER_SITE_OTHER): Payer: BC Managed Care – PPO | Admitting: Pulmonary Disease

## 2014-04-12 VITALS — BP 110/64 | HR 62 | Temp 98.5°F | Ht 66.0 in | Wt 289.0 lb

## 2014-04-12 DIAGNOSIS — G4733 Obstructive sleep apnea (adult) (pediatric): Secondary | ICD-10-CM

## 2014-04-12 DIAGNOSIS — I480 Paroxysmal atrial fibrillation: Secondary | ICD-10-CM

## 2014-04-12 DIAGNOSIS — Z9989 Dependence on other enabling machines and devices: Principal | ICD-10-CM

## 2014-04-12 DIAGNOSIS — I4891 Unspecified atrial fibrillation: Secondary | ICD-10-CM

## 2014-04-12 NOTE — Patient Instructions (Signed)
Will get CPAP report Follow up in 1 year

## 2014-04-12 NOTE — Progress Notes (Signed)
Chief Complaint  Patient presents with  . Follow-up    Pt states she is wearing a CPAP nightly for 8 hours. Pt denies issues with pressure, mask or machine. Pt states she is doing well overall.     History of Present Illness: Desiree Schultz is a 43 y.o. female with OSA.  She has been doing well with CPAP.  She can't sleep w/o using CPAP.  She gets about 8 hrs sleep per night.  She has nasal mask, and no issues with mask.  She has noticed feeling less energy since being on heart medicine.  She thinks her heart rate is too slow.  She is followed by Dr. Caryl Comes.  TESTS: PSG 05/03/06 >> AHI 9, REM 34, SpO2 low 84%. PSG 03/23/11 >> AHI 3, RDI 16, SpO2 low 90%, bruxism, REM AHI 5, no supine sleep. Auto CPAP 04/25/11 to 05/14/11 >> Used on 19 of 20 nights with average 7 hrs 53 min.  Average AHI 1.4 with optimal pressure 10 cm H2O.    Past medical hx, Past surgical hx, Medications, Allergies, Family hx, Social hx all reviewed.   Physical Exam:  General - No distress ENT - No sinus tenderness, no oral exudate, no LAN Cardiac - s1s2 regular, no murmur Chest - No wheeze/rales/dullness Back - No focal tenderness Abd - Soft, non-tender Ext - No edema Neuro - Normal strength Skin - No rashes Psych - normal mood, and behavior   Assessment/Plan:  Chesley Mires, MD [place] [facility] Pager:  [phone]

## 2014-04-13 ENCOUNTER — Encounter: Payer: Self-pay | Admitting: Internal Medicine

## 2014-04-14 NOTE — Assessment & Plan Note (Signed)
She is compliant with therapy and reports benefit.  Will get her download and call her with results.

## 2014-04-14 NOTE — Assessment & Plan Note (Signed)
She is concerned that her heart rate is too low, and this is causing her to feel more fatigued and more difficulty keeping up with activity.  Advised her that I would route information to Dr. Caryl Comes and advised her to discuss with Dr. Caryl Comes further.

## 2014-04-16 ENCOUNTER — Telehealth: Payer: Self-pay | Admitting: *Deleted

## 2014-04-16 NOTE — Telephone Encounter (Signed)
lmptcb for CA Score results from Dr. Caryl Comes

## 2014-04-16 NOTE — Telephone Encounter (Signed)
F/u ° ° °Pt returning your call. Please call pt. °

## 2014-04-16 NOTE — Telephone Encounter (Signed)
lmom CA Score normal

## 2014-05-04 NOTE — Telephone Encounter (Signed)
Informed patient CT calcium scoring impression showed there may be an issue with the lungs and we will be faxing results to PCP to make further determination of testing/following this. She verbalized understanding.

## 2014-05-16 ENCOUNTER — Telehealth: Payer: Self-pay | Admitting: Pulmonary Disease

## 2014-05-16 NOTE — Telephone Encounter (Signed)
CPAP 01/28/14 to 04/27/14 >> used on 90 of 90 nights with average 9 hrs and 49 min.  Average AHI is 1.1 with CPAP 10 cm H2O.   Will have my nurse inform pt that CPAP report shows good control of sleep apnea.  No change to current therapy.

## 2014-05-19 ENCOUNTER — Other Ambulatory Visit: Payer: Self-pay | Admitting: *Deleted

## 2014-05-19 MED ORDER — METOPROLOL SUCCINATE ER 25 MG PO TB24
ORAL_TABLET | ORAL | Status: DC
Start: 2014-05-19 — End: 2014-12-17

## 2014-05-19 MED ORDER — PROPAFENONE HCL 225 MG PO TABS: 225.0000 mg | ORAL_TABLET | Freq: Two times a day (BID) | ORAL | Status: DC

## 2014-05-19 NOTE — Telephone Encounter (Signed)
Results have been explained to patient, pt expressed understanding. Nothing further needed.  

## 2014-06-03 ENCOUNTER — Encounter: Payer: Self-pay | Admitting: Internal Medicine

## 2014-06-13 ENCOUNTER — Other Ambulatory Visit: Payer: Self-pay | Admitting: Internal Medicine

## 2014-07-30 ENCOUNTER — Ambulatory Visit: Payer: BC Managed Care – PPO | Admitting: Internal Medicine

## 2014-08-09 ENCOUNTER — Ambulatory Visit (INDEPENDENT_AMBULATORY_CARE_PROVIDER_SITE_OTHER): Payer: BC Managed Care – PPO | Admitting: Internal Medicine

## 2014-08-09 ENCOUNTER — Encounter: Payer: Self-pay | Admitting: Internal Medicine

## 2014-08-09 VITALS — BP 110/80 | HR 68 | Ht 66.0 in | Wt 291.4 lb

## 2014-08-09 DIAGNOSIS — I480 Paroxysmal atrial fibrillation: Secondary | ICD-10-CM

## 2014-08-09 NOTE — Progress Notes (Signed)
Patient Care Team: Cyril Mourning, MD as PCP - General (Obstetrics and Gynecology) Limmie Patricia, MD as Attending Physician (Endocrinology) Thompson Grayer, MD as Attending Physician (Cardiology)   HPI  Desiree Schultz is a 43 y.o. female Seen in followup for atrial fibrillation occurring in the context of polycystic ovarian syndrome. She was started on apixaban. It was her impression that atrial fibrillation track of her period.  She had been treated with flecainide which we decreased from 100--50 mg twice daily for fatigue; this was done at her last visit.  She is also having atypical chest pains, and in the context of her flecainide we undertook CT scoring which was 0.  She could not tolerate flecainide. We tried her on propafenone twice a day. She Tikosyn only once a day. She has not had any significant atrial fibrillation   She also had echocardiographic evidence of LVE/L AE  Repeat echocardiogram demonstrated left ventricular enlargement with end-diastolic parameters of 08.1/44.8 (36.1/53) there is no wall thickening. Left atrial dimensions were borderline enlarged in one dimension and normal into dimension (46/1.78)  There is a questionable family history of cardiomyopathy  we have spoken in the past about genetic testing although I can't for the life of me figure out what prompted me to pursue this discussion with her based on what we know thus far   She takes aldactone for her PCOS         Past Medical History  Diagnosis Date  . Atrial fibrillation     2006  . Menorrhagia     2011  . Hypothyroid   . Prediabetes   . Depression   . PCOS (polycystic ovarian syndrome)   . OSA (obstructive sleep apnea)     on CPAP  . Obesity (BMI 30-39.9)   . Chronic anxiety   . Vitamin D deficiency   . Allergic rhinitis   . Hx of cardiovascular stress test     a. Myoview 10/13:  no ischemia; EF 57%  . Elevated blood pressure     On therapy. Probably hypertension     Past Surgical History  Procedure Laterality Date  . Endometrial ablation    . Dilation and curettage, diagnostic / therapeutic    . Tonsillectomy  2007  . Bladder surgery  1997    interstitial cystitis   . Hand surgery  2006    due to cat bite  . Renal biopsy, open  2006  . Endometrial ablation  08/2010  . Ovarian cyst removal  04/28/13    Current Outpatient Prescriptions  Medication Sig Dispense Refill  . Ascorbic Acid (VITAMIN C) 1000 MG tablet Take 1,000 mg by mouth daily.    . cetirizine (ZYRTEC) 10 MG tablet Take 10 mg by mouth daily.      . cholecalciferol (VITAMIN D) 400 UNITS TABS tablet Take 400 Units by mouth daily.    . citalopram (CELEXA) 40 MG tablet 1 and 1/2 tabs once daily (=60mg )    . ELIQUIS 5 MG TABS tablet TAKE 1 TABLET BY MOUTH TWICE DAILY 60 tablet 5  . levothyroxine (SYNTHROID) 137 MCG tablet Take 137 mcg by mouth daily before breakfast.    . metFORMIN (GLUCOPHAGE-XR) 500 MG 24 hr tablet Take 500 mg by mouth daily.  3  . metoprolol succinate (TOPROL-XL) 25 MG 24 hr tablet TAKE 1 TABLET BY MOUTH DAILY (Patient taking differently: Take 12.5 mg by mouth daily. TAKE 1 TABLET BY MOUTH DAILY) 90 tablet 0  .  propafenone (RYTHMOL) 225 MG tablet Take 1 tablet (225 mg total) by mouth 2 (two) times daily. (Patient taking differently: Take 225 mg by mouth 2 (two) times daily. Taking 1/2 tab one daily) 180 tablet 0  . spironolactone (ALDACTONE) 50 MG tablet Take 1 tablet (50 mg total) by mouth daily. 30 tablet 2  . VITAMIN K PO Take 1 tablet by mouth daily.     No current facility-administered medications for this visit.    Allergies  Allergen Reactions  . Benadryl [Diphenhydramine Hcl]     itching  . Fish Oil     Makes hands raw, mouth and female area   . Influenza Vaccines     Gives pt the flu and site of injection swells and hot  . Latex     Burns skin  . Sulfur     Bladder spasms, mouth raw  . Tape     Burns skin, band-aid, ekg stickers    Review of  Systems negative except from HPI and PMH  Physical Exam BP 110/80 mmHg  Pulse 68  Ht 5\' 6"  (1.676 m)  Wt 291 lb 6.4 oz (132.178 kg)  BMI 47.06 kg/m2 Well developed and well nourished in no acute distress HENT normal E scleral and icterus clear Neck Supple JVP flat; carotids brisk and full Clear to ausculation  Regular rate and rhythm, no murmurs gallops or rub Soft with active bowel sounds No clubbing cyanosis  Edema Alert and oriented, grossly normal motor and sensory function Skin Warm and Dry  ECG  SR 58 19/09/42  Assessment and  Plan  Paroxysmal atrial fibrillation   sinus bradycardia  Left ventricular enlargement  Chest pain   Polycystic ovarian syndrome  Hypertension  Obstructive sleep apnea   We will work on getting the family history clarified.  She will continue taking propafenone once a day as she is taking it currently as it was working  We spent more than 50% of our >25 min visit in face to face counseling regarding the above   .

## 2014-08-09 NOTE — Patient Instructions (Signed)
Your physician recommends that you continue on your current medications as directed. Please refer to the Current Medication list given to you today.  Your physician wants you to follow-up in: 6 months with Dr. Klein. You will receive a reminder letter in the mail two months in advance. If you don't receive a letter, please call our office to schedule the follow-up appointment.  

## 2014-08-18 ENCOUNTER — Other Ambulatory Visit: Payer: Self-pay | Admitting: Internal Medicine

## 2014-09-10 ENCOUNTER — Emergency Department (HOSPITAL_COMMUNITY)
Admission: EM | Admit: 2014-09-10 | Discharge: 2014-09-10 | Disposition: A | Discharge status: Home / Self Care | Payer: No Typology Code available for payment source | Source: Home / Self Care | Attending: Family Medicine | Admitting: Family Medicine

## 2014-09-10 ENCOUNTER — Encounter (HOSPITAL_COMMUNITY): Payer: Self-pay

## 2014-09-10 DIAGNOSIS — J069 Acute upper respiratory infection, unspecified: Secondary | ICD-10-CM

## 2014-09-10 NOTE — Discharge Instructions (Signed)
Upper Respiratory Infection, Adult Lots of nasal saline frequently Try switching to allegra 180 mg a day Afrin nasal spray Drink lots of fluids No decongestants An upper respiratory infection (URI) is also sometimes known as the common cold. The upper respiratory tract includes the nose, sinuses, throat, trachea, and bronchi. Bronchi are the airways leading to the lungs. Most people improve within 1 week, but symptoms can last up to 2 weeks. A residual cough may last even longer.  CAUSES Many different viruses can infect the tissues lining the upper respiratory tract. The tissues become irritated and inflamed and often become very moist. Mucus production is also common. A cold is contagious. You can easily spread the virus to others by oral contact. This includes kissing, sharing a glass, coughing, or sneezing. Touching your mouth or nose and then touching a surface, which is then touched by another person, can also spread the virus. SYMPTOMS  Symptoms typically develop 1 to 3 days after you come in contact with a cold virus. Symptoms vary from person to person. They may include:  Runny nose.  Sneezing.  Nasal congestion.  Sinus irritation.  Sore throat.  Loss of voice (laryngitis).  Cough.  Fatigue.  Muscle aches.  Loss of appetite.  Headache.  Low-grade fever. DIAGNOSIS  You might diagnose your own cold based on familiar symptoms, since most people get a cold 2 to 3 times a year. Your caregiver can confirm this based on your exam. Most importantly, your caregiver can check that your symptoms are not due to another disease such as strep throat, sinusitis, pneumonia, asthma, or epiglottitis. Blood tests, throat tests, and X-rays are not necessary to diagnose a common cold, but they may sometimes be helpful in excluding other more serious diseases. Your caregiver will decide if any further tests are required. RISKS AND COMPLICATIONS  You may be at risk for a more severe case of  the common cold if you smoke cigarettes, have chronic heart disease (such as heart failure) or lung disease (such as asthma), or if you have a weakened immune system. The very young and very old are also at risk for more serious infections. Bacterial sinusitis, middle ear infections, and bacterial pneumonia can complicate the common cold. The common cold can worsen asthma and chronic obstructive pulmonary disease (COPD). Sometimes, these complications can require emergency medical care and may be life-threatening. PREVENTION  The best way to protect against getting a cold is to practice good hygiene. Avoid oral or hand contact with people with cold symptoms. Wash your hands often if contact occurs. There is no clear evidence that vitamin C, vitamin E, echinacea, or exercise reduces the chance of developing a cold. However, it is always recommended to get plenty of rest and practice good nutrition. TREATMENT  Treatment is directed at relieving symptoms. There is no cure. Antibiotics are not effective, because the infection is caused by a virus, not by bacteria. Treatment may include:  Increased fluid intake. Sports drinks offer valuable electrolytes, sugars, and fluids.  Breathing heated mist or steam (vaporizer or shower).  Eating chicken soup or other clear broths, and maintaining good nutrition.  Getting plenty of rest.  Using gargles or lozenges for comfort.  Controlling fevers with ibuprofen or acetaminophen as directed by your caregiver.  Increasing usage of your inhaler if you have asthma. Zinc gel and zinc lozenges, taken in the first 24 hours of the common cold, can shorten the duration and lessen the severity of symptoms. Pain medicines may help  with fever, muscle aches, and throat pain. A variety of non-prescription medicines are available to treat congestion and runny nose. Your caregiver can make recommendations and may suggest nasal or lung inhalers for other symptoms.  HOME CARE  INSTRUCTIONS   Only take over-the-counter or prescription medicines for pain, discomfort, or fever as directed by your caregiver.  Use a warm mist humidifier or inhale steam from a shower to increase air moisture. This may keep secretions moist and make it easier to breathe.  Drink enough water and fluids to keep your urine clear or pale yellow.  Rest as needed.  Return to work when your temperature has returned to normal or as your caregiver advises. You may need to stay home longer to avoid infecting others. You can also use a face mask and careful hand washing to prevent spread of the virus. SEEK MEDICAL CARE IF:   After the first few days, you feel you are getting worse rather than better.  You need your caregiver's advice about medicines to control symptoms.  You develop chills, worsening shortness of breath, or brown or red sputum. These may be signs of pneumonia.  You develop yellow or brown nasal discharge or pain in the face, especially when you bend forward. These may be signs of sinusitis.  You develop a fever, swollen neck glands, pain with swallowing, or white areas in the back of your throat. These may be signs of strep throat. SEEK IMMEDIATE MEDICAL CARE IF:   You have a fever.  You develop severe or persistent headache, ear pain, sinus pain, or chest pain.  You develop wheezing, a prolonged cough, cough up blood, or have a change in your usual mucus (if you have chronic lung disease).  You develop sore muscles or a stiff neck. Document Released: 02/13/2001 Document Revised: 11/12/2011 Document Reviewed: 11/25/2013 [facility] Patient Information 2015 [place], [place]. This information is not intended to replace advice given to you by your health care provider. Make sure you discuss any questions you have with your health care provider.  Cool Mist Vaporizers Vaporizers may help relieve the symptoms of a cough and cold. They add moisture to the air, which helps mucus to  become thinner and less sticky. This makes it easier to breathe and cough up secretions. Cool mist vaporizers do not cause serious burns like hot mist vaporizers, which may also be called steamers or humidifiers. Vaporizers have not been proven to help with colds. You should not use a vaporizer if you are allergic to mold. HOME CARE INSTRUCTIONS  Follow the package instructions for the vaporizer.  Do not use anything other than distilled water in the vaporizer.  Do not run the vaporizer all of the time. This can cause mold or bacteria to grow in the vaporizer.  Clean the vaporizer after each time it is used.  Clean and dry the vaporizer well before storing it.  Stop using the vaporizer if worsening respiratory symptoms develop. Document Released: 05/17/2004 Document Revised: 08/25/2013 Document Reviewed: 01/07/2013 [facility] Patient Information 2015 [place], [place]. This information is not intended to replace advice given to you by your health care provider. Make sure you discuss any questions you have with your health care provider.

## 2014-09-10 NOTE — ED Notes (Signed)
C/o she has had URI type symptoms since laste December, coughing, congested, and has lost her voice. Had been using mucinex, tylenol, OTC cough drops.

## 2014-09-10 NOTE — ED Provider Notes (Signed)
CSN: [id number]     Arrival date & time 09/10/14  1601 History   First MD Initiated Contact with Patient 09/10/14 1631     Chief Complaint  Patient presents with  . URI   (Consider location/radiation/quality/duration/timing/severity/associated sxs/prior Treatment) HPI Comments: 44 year old morbidly obese female with history of impaired fasting glucose, PAF and hypertension presents with 10 day history of upper respiratory symptoms including PND, change in voice, stuffy nose and cough. She denies fever. She states that she has been taking some decongestants but has increased her frequency of PAF. She is currently having regular rate and rhythm apical pulse.   Past Medical History  Diagnosis Date  . Atrial fibrillation     2006  . Menorrhagia     2011  . Hypothyroid   . Prediabetes   . Depression   . PCOS (polycystic ovarian syndrome)   . OSA (obstructive sleep apnea)     on CPAP  . Obesity (BMI 30-39.9)   . Chronic anxiety   . Vitamin D deficiency   . Allergic rhinitis   . Hx of cardiovascular stress test     a. Myoview 10/13:  no ischemia; EF 57%  . Elevated blood pressure     On therapy. Probably hypertension   Past Surgical History  Procedure Laterality Date  . Endometrial ablation    . Dilation and curettage, diagnostic / therapeutic    . Tonsillectomy  2007  . Bladder surgery  1997    interstitial cystitis   . Hand surgery  2006    due to cat bite  . Renal biopsy, open  2006  . Endometrial ablation  08/2010  . Ovarian cyst removal  04/28/13   Family History  Problem Relation Age of Onset  . Cardiomyopathy Father   . Heart disease Father   . Hyperlipidemia Father   . Hypertension Father   . Hypertension      fathers side  . Hyperlipidemia      fathers side  . Heart failure      both grandparents - paternal  . Coronary artery disease Maternal Grandmother   . Heart attack Maternal Grandmother     and other maternal family members  . Heart attack Maternal  Grandfather    History  Substance Use Topics  . Smoking status: Former Smoker -- 2.00 packs/day for 16 years    Types: Cigarettes    Quit date: 09/03/1998  . Smokeless tobacco: Not on file  . Alcohol Use: Yes     Comment: maybe 2 a mth   OB History    No data available     Review of Systems  Constitutional: Positive for activity change. Negative for fever, chills, appetite change and fatigue.  HENT: Positive for congestion, postnasal drip, rhinorrhea and sore throat. Negative for facial swelling.   Eyes: Negative.   Respiratory: Positive for cough. Negative for shortness of breath and wheezing.   Cardiovascular: Negative.   Gastrointestinal: Negative.   Musculoskeletal: Negative for neck pain and neck stiffness.  Skin: Negative for pallor and rash.  Neurological: Negative.     Allergies  Benadryl; Fish oil; Influenza vaccines; Latex; Sulfur; and Tape  Home Medications   Prior to Admission medications   Medication Sig Start Date End Date Taking? Authorizing Provider  citalopram (CELEXA) 40 MG tablet 1 and 1/2 tabs once daily (=60mg )   Yes Historical Provider, MD  ELIQUIS 5 MG TABS tablet TAKE 1 TABLET BY MOUTH TWICE DAILY 06/15/14  Yes Deboraha Sprang,  MD  levothyroxine (SYNTHROID) 137 MCG tablet Take 137 mcg by mouth daily before breakfast.   Yes Historical Provider, MD  metFORMIN (GLUCOPHAGE-XR) 500 MG 24 hr tablet Take 500 mg by mouth daily. 07/29/14  Yes Historical Provider, MD  metoprolol succinate (TOPROL-XL) 25 MG 24 hr tablet TAKE 1 TABLET BY MOUTH DAILY Patient taking differently: Take 12.5 mg by mouth daily. TAKE 1 TABLET BY MOUTH DAILY 05/19/14  Yes Deboraha Sprang, MD  propafenone (RYTHMOL) 225 MG tablet TAKE 1 TABLET TWICE A DAY (NEW MEDICATION) 08/19/14  Yes Deboraha Sprang, MD  spironolactone (ALDACTONE) 50 MG tablet Take 1 tablet (50 mg total) by mouth daily. 02/15/14  Yes Deboraha Sprang, MD  Ascorbic Acid (VITAMIN C) 1000 MG tablet Take 1,000 mg by mouth daily.     Historical Provider, MD  cetirizine (ZYRTEC) 10 MG tablet Take 10 mg by mouth daily.      Historical Provider, MD  cholecalciferol (VITAMIN D) 400 UNITS TABS tablet Take 400 Units by mouth daily.    Historical Provider, MD  VITAMIN K PO Take 1 tablet by mouth daily.    Historical Provider, MD   BP 150/78 mmHg  Pulse 82  Temp(Src) 99.4 F (37.4 C) (Oral)  Resp 18  SpO2 98%  LMP 08/06/2014 Physical Exam  Constitutional: She is oriented to person, place, and time. She appears well-developed and well-nourished. No distress.  HENT:  Mouth/Throat: No oropharyngeal exudate.  Bilateral TMs with mild retraction. No evidence of effusion or erythema. Oropharynx pink, very little erythema and copious amount of clear thick PND.  Eyes: Conjunctivae and EOM are normal.  Neck: Normal range of motion. Neck supple.  Cardiovascular: Normal rate, regular rhythm and normal heart sounds.   Pulmonary/Chest: Effort normal. No respiratory distress. She has no wheezes. She has no rales.  Musculoskeletal: Normal range of motion. She exhibits no edema.  Lymphadenopathy:    She has no cervical adenopathy.  Neurological: She is alert and oriented to person, place, and time.  Skin: Skin is warm and dry. No rash noted.  Psychiatric: She has a normal mood and affect.  Nursing note and vitals reviewed.   ED Course  Procedures (including critical care time) Labs Review Labs Reviewed - No data to display  Imaging Review No results found.   MDM   1. URI (upper respiratory infection)    other diagnoses pertinent for this visit is PAF for which she takes beta blocker, Rythmol and Eliquis. She has been taking some decongestants but has had an increase in the frequency of her PAF. It is recommended she not take any more decongestants. There is no current evidence for a true bacterial sinusitis. She is allergic to Benadryl. Therefore the recommendation of chlorpheniramine has been suspended.   Lots of nasal  saline frequently Try switching to allegra 180 mg a day Afrin nasal spray Drink lots of fluids No decongestants     [name] Napoleon, NP 09/10/14 1701  [name] Napoleon, NP 09/10/14 1702

## 2014-10-05 ENCOUNTER — Ambulatory Visit (INDEPENDENT_AMBULATORY_CARE_PROVIDER_SITE_OTHER): Payer: No Typology Code available for payment source | Admitting: Family Medicine

## 2014-10-05 VITALS — BP 129/62 | HR 66 | Temp 98.3°F | Resp 16 | Ht 66.0 in | Wt 299.0 lb

## 2014-10-05 DIAGNOSIS — E669 Obesity, unspecified: Secondary | ICD-10-CM

## 2014-10-05 DIAGNOSIS — G4733 Obstructive sleep apnea (adult) (pediatric): Secondary | ICD-10-CM

## 2014-10-05 DIAGNOSIS — Z8669 Personal history of other diseases of the nervous system and sense organs: Secondary | ICD-10-CM

## 2014-10-05 DIAGNOSIS — E039 Hypothyroidism, unspecified: Secondary | ICD-10-CM

## 2014-10-05 DIAGNOSIS — E8881 Metabolic syndrome: Secondary | ICD-10-CM | POA: Insufficient documentation

## 2014-10-05 DIAGNOSIS — E282 Polycystic ovarian syndrome: Secondary | ICD-10-CM

## 2014-10-05 DIAGNOSIS — Z23 Encounter for immunization: Secondary | ICD-10-CM

## 2014-10-05 DIAGNOSIS — I480 Paroxysmal atrial fibrillation: Secondary | ICD-10-CM

## 2014-10-05 LAB — COMPLETE METABOLIC PANEL WITH GFR
ALT: 31 U/L (ref 0–35)
AST: 30 U/L (ref 0–37)
Albumin: 4.1 g/dL (ref 3.5–5.2)
Alkaline Phosphatase: 51 U/L (ref 39–117)
BUN: 16 mg/dL (ref 6–23)
CALCIUM: 9.9 mg/dL (ref 8.4–10.5)
CO2: 26 meq/L (ref 19–32)
Chloride: 104 mEq/L (ref 96–112)
Creat: 0.86 mg/dL (ref 0.50–1.10)
GFR, EST NON AFRICAN AMERICAN: 82 mL/min
GFR, Est African American: >89 mL/min
Glucose, Bld: 76 mg/dL (ref 70–99)
Potassium: 4.5 mEq/L (ref 3.5–5.3)
SODIUM: 138 meq/L (ref 135–145)
TOTAL PROTEIN: 6.9 g/dL (ref 6.0–8.3)
Total Bilirubin: 0.3 mg/dL (ref 0.2–1.2)

## 2014-10-05 LAB — CBC WITH DIFFERENTIAL/PLATELET
BASOS PCT: 0 % (ref 0–1)
Basophils Absolute: 0 10*3/uL (ref 0.0–0.1)
EOS ABS: 0.4 10*3/uL (ref 0.0–0.7)
EOS PCT: 4 % (ref 0–5)
HEMATOCRIT: 42.8 % (ref 36.0–46.0)
HEMOGLOBIN: 14.3 g/dL (ref 12.0–15.0)
Lymphocytes Relative: 28 % (ref 12–46)
Lymphs Abs: 2.6 10*3/uL (ref 0.7–4.0)
MCH: 30.7 pg (ref 26.0–34.0)
MCHC: 33.4 g/dL (ref 30.0–36.0)
MCV: 91.8 fL (ref 78.0–100.0)
MPV: 11.1 fL (ref 8.6–12.4)
Monocytes Absolute: 0.8 10*3/uL (ref 0.1–1.0)
Monocytes Relative: 9 % (ref 3–12)
NEUTROS ABS: 5.4 10*3/uL (ref 1.7–7.7)
Neutrophils Relative %: 59 % (ref 43–77)
PLATELETS: 327 10*3/uL (ref 150–400)
RBC: 4.66 MIL/uL (ref 3.87–5.11)
RDW: 13.6 % (ref 11.5–15.5)
WBC: 9.2 10*3/uL (ref 4.0–10.5)

## 2014-10-05 MED ORDER — METFORMIN HCL ER 500 MG PO TB24: 500.0000 mg | ORAL_TABLET | Freq: Every day | ORAL | Status: DC

## 2014-10-05 NOTE — Progress Notes (Signed)
   Subjective:    Patient ID: Desiree Schultz, female    DOB: [birth year], 44 y.o.   MRN: [id number]  HPI    Review of Systems     Objective:   Physical Exam        Assessment & Plan:

## 2014-10-05 NOTE — Patient Instructions (Signed)
Metabolic Syndrome, Adult Metabolic syndrome describes a group of risk factors for heart disease and diabetes. This syndrome has other names including Insulin Resistance Syndrome. The more risk factors you have, the higher your risk of having a heart attack, stroke, or developing diabetes. These risk factors include:  High blood sugar.  High blood triglyceride (a fat found in the blood) level.  High blood pressure.  Abdominal obesity (your extra weight is around your waist instead of your hips).  Low levels of high-density lipoprotein, HDL (good blood cholesterol). If you have any three of these risk factors, you have metabolic syndrome. If you have even one of these factors, you should make lifestyle changes to improve your health in order to prevent serious health diseases.  In people with metabolic syndrome, the cells do not respond properly to insulin. This can lead to high levels of glucose in the blood, which can interfere with normal body processes. Eventually, this can cause high blood pressure and higher fat levels in the blood, and inflammation of your blood vessels. The result can be heart disease and stroke.  CAUSES   Eating a diet rich in calories and saturated fat.  Too little physical activity.  Being overweight. Other underlying causes are:  Family history (genetics).  Ethnicity (South Asians are at a higher risk).  Older age (your chances of developing metabolic syndrome are higher as you grow older).  Insulin resistance. SYMPTOMS  By itself, metabolic syndrome has no symptoms. However, you might have symptoms of diabetes (high blood sugar) or high blood pressure, such as:  Increased thirst, urination, and tiredness.  Dizzy spells.  Dull headaches that are unusual for you.  Blurred vision.  Nosebleeds. DIAGNOSIS  Your caregiver may make a diagnosis of metabolic syndrome if you have at least three of these factors:  If you are overweight mostly around the  waist. This means a waistline greater than 40" in men and more than 35" in women. The waistline limits are 31 to 35 inches for women and 37 to 39 inches for men. In those who have certain genetic risk factors, such as having a family history of diabetes or being of Asian descent.  If you have a blood pressure of 130/85 mm Hg or more, or if you are being treated for high blood pressure.  If your blood triglyceride level is 150 mg/dL or more, or you are being treated for high levels of triglyceride.  If the level of HDL in your blood is below 40 mg/dL in men, less than 50 mg/dL in women, or you are receiving treatment for low levels of HDL.  If the level of sugar in your blood is high with fasting blood sugar level of 110 mg/dL or more, or you are under treatment for diabetes. TREATMENT  Your caregiver may have you make lifestyle changes, which may include:  Exercise.  Losing weight.  Maintaining a healthy diet.  Quitting smoking. The lifestyle changes listed above are key in reducing your risk for heart disease and stroke. Medicines may also be prescribed to help your body respond to insulin better and to reduce your blood pressure and blood fat levels. Aspirin may be recommended to reduce risks of heart disease or stroke.  HOME CARE INSTRUCTIONS   Exercise.  Measure your waist at regular intervals just above the hipbones after you have breathed out.  Maintain a healthy diet.  Eat fruits, such as apples, oranges, and pears.  Eat vegetables.  Eat legumes, such as kidney  beans, peas, and lentils.  Eat food rich in soluble fiber, such as whole grain cereal, oatmeal, and oat bran.  Use olive or safflower oils and avoid saturated fats.  Eat nuts.  Limit the amount of salt you eat or add to food.  Limit the amount of alcohol you drink.  Include fish in your diet, if possible.  Stop smoking if you are a smoker.  Maintain regular follow-up appointments.  Follow your  caregiver's advice. SEEK MEDICAL CARE IF:   You feel very tired or fatigued.  You develop excessive thirst.  You pass large quantities of urine.  You are putting on weight around your waist rather than losing weight.  You develop headaches over and over again.  You have off-and-on dizzy spells. SEEK IMMEDIATE MEDICAL CARE IF:   You develop nosebleeds.  You develop sudden blurred vision.  You develop sudden dizzy spells.  You develop chest pains, trouble breathing, or feel an abnormal or irregular heart beat.  You have a fainting episode.  You develop any sudden trouble speaking and/or swallowing.  You develop sudden weakness in one arm and/or one leg. MAKE SURE YOU:   Understand these instructions.  Will watch your condition.  Will get help right away if you are not doing well or get worse. Document Released: 11/27/2007 Document Revised: 01/04/2014 Document Reviewed: 11/27/2007 [facility] Patient Information 2015 [place], [place]. This information is not intended to replace advice given to you by your health care provider. Make sure you discuss any questions you have with your health care provider. Polycystic Ovarian Syndrome Polycystic ovarian syndrome (PCOS) is a common hormonal disorder among women of reproductive age. Most women with PCOS grow many small cysts on their ovaries. PCOS can cause problems with your periods and make it difficult to get pregnant. It can also cause an increased risk of miscarriage with pregnancy. If left untreated, PCOS can lead to serious health problems, such as diabetes and heart disease. CAUSES The cause of PCOS is not fully understood, but genetics may be a factor. SIGNS AND SYMPTOMS   Infrequent or no menstrual periods.   Inability to get pregnant (infertility) because of not ovulating.   Increased growth of hair on the face, chest, stomach, back, thumbs, thighs, or toes.   Acne, oily skin, or dandruff.   Pelvic pain.    Weight gain or obesity, usually carrying extra weight around the waist.   Type 2 diabetes.   High cholesterol.   High blood pressure.   Female-pattern baldness or thinning hair.   Patches of thickened and dark brown or black skin on the neck, arms, breasts, or thighs.   Tiny excess flaps of skin (skin tags) in the armpits or neck area.   Excessive snoring and having breathing stop at times while asleep (sleep apnea).   Deepening of the voice.   Gestational diabetes when pregnant.  DIAGNOSIS  There is no single test to diagnose PCOS.   Your health care provider will:   Take a medical history.   Perform a pelvic exam.   Have ultrasonography done.   Check your female and female hormone levels.   Measure glucose or sugar levels in the blood.   Do other blood tests.   If you are producing too many female hormones, your health care provider will make sure it is from PCOS. At the physical exam, your health care provider will want to evaluate the areas of increased hair growth. Try to allow natural hair growth for a few  days before the visit.   During a pelvic exam, the ovaries may be enlarged or swollen because of the increased number of small cysts. This can be seen more easily by using vaginal ultrasonography or screening to examine the ovaries and lining of the uterus (endometrium) for cysts. The uterine lining may become thicker if you have not been having a regular period.  TREATMENT  Because there is no cure for PCOS, it needs to be managed to prevent problems. Treatments are based on your symptoms. Treatment is also based on whether you want to have a baby or whether you need contraception.  Treatment may include:   Progesterone hormone to start a menstrual period.   Birth control pills to make you have regular menstrual periods.   Medicines to make you ovulate, if you want to get pregnant.   Medicines to control your insulin.   Medicine to  control your blood pressure.   Medicine and diet to control your high cholesterol and triglycerides in your blood.  Medicine to reduce excessive hair growth.  Surgery, making small holes in the ovary, to decrease the amount of female hormone production. This is done through a long, lighted tube (laparoscope) placed into the pelvis through a tiny incision in the lower abdomen.  HOME CARE INSTRUCTIONS  Only take over-the-counter or prescription medicine as directed by your health care provider.  Pay attention to the foods you eat and your activity levels. This can help reduce the effects of PCOS.  Keep your weight under control.  Eat foods that are low in carbohydrate and high in fiber.  Exercise regularly. SEEK MEDICAL CARE IF:  Your symptoms do not get better with medicine.  You have new symptoms. Document Released: 12/14/2004 Document Revised: 06/10/2013 Document Reviewed: 02/05/2013 [facility] Patient Information 2015 [place], [place]. This information is not intended to replace advice given to you by your health care provider. Make sure you discuss any questions you have with your health care provider.

## 2014-10-05 NOTE — Progress Notes (Signed)
Subjective:    Patient ID: Desiree Schultz, female    DOB: 17-Jan-1971, 44 y.o.   MRN: [id number]  HPI  Ms. Desiree Schultz, a 44 year old female with a history of atrial fibrilation, hypothyroid, polycystic ovarian syndrome, and sleep apnea presents to establish care with a primary provider. She states that Dr. Dian Queen, Obgyn, was her PCP prior to losing her job.  Desiree Schultz states that she is followed by Dr. Adam Phenix for atrial fibrillation. She states that she is currently taking propafenone, metoprolol, and eliquis daily without complication. Patient has had a recent EKG and echocardiogram. Patient denies dizziness,  chest pain, dyspnea, fatigue, lower extremity edema, near-syncope, palpitations and tachypnea.  Cardiovascular risk factors include: obesity (BMI >= 30 kg/m2).   Patient reports a history of hypothyroidism controlled on levothyroxine. The symptoms are mild.  The problem has been controlled.  Previous thyroid studies include TSH 1 year ago. Desiree Schultz denies weakness, fatigue, weight gain,  constipation, cold intolerance, skin/hair changes, or constipation.   Patient also has a history of obstructive sleep apnea. Sleep apnea was diagnosed in 2012 by Dr. Halford Chessman, pulmonologist. She states that she currently sleeps with a CPAP.  Patient generally gets 8 or 9 hours of sleep per night, and states they generally have good sleep quality.     Past Medical History  Diagnosis Date  . Atrial fibrillation     2006  . Menorrhagia     2011  . Hypothyroid   . Prediabetes   . Depression   . PCOS (polycystic ovarian syndrome)   . OSA (obstructive sleep apnea)     on CPAP  . Obesity (BMI 30-39.9)   . Chronic anxiety   . Vitamin D deficiency   . Allergic rhinitis   . Hx of cardiovascular stress test     a. Myoview 10/13:  no ischemia; EF 57%  . Elevated blood pressure     On therapy. Probably hypertension   Allergies  Allergen Reactions  . Benadryl [Diphenhydramine  Hcl]     itching  . Fish Oil     Makes hands raw, mouth and female area   . Influenza Vaccines     Gives pt the flu and site of injection swells and hot  . Latex     Burns skin  . Sulfur     Bladder spasms, mouth raw  . Tape     Burns skin, band-aid, ekg stickers   History   Social History Narrative   Pt lives in [place] with friend.  Works as a Designer, jewellery   Review of Systems  Constitutional: Negative.   HENT: Negative.   Eyes: Negative.   Respiratory: Negative.  Negative for chest tightness, shortness of breath and wheezing.   Cardiovascular: Negative.  Negative for palpitations and leg swelling.  Gastrointestinal: Negative.  Negative for diarrhea and constipation.  Endocrine: Negative.  Negative for cold intolerance, heat intolerance, polydipsia, polyphagia and polyuria.  Genitourinary: Negative.   Musculoskeletal: Negative.   Skin: Negative.   Allergic/Immunologic: Negative.   Neurological: Negative.  Negative for dizziness, seizures, syncope, light-headedness and headaches.  Hematological: Negative.   Psychiatric/Behavioral: Negative.  Negative for suicidal ideas and sleep disturbance.       Objective:   Physical Exam  Constitutional: She appears well-developed and well-nourished.  HENT:  Head: Normocephalic and atraumatic.  Right Ear: External ear normal.  Left Ear: External ear normal.  Mouth/Throat: Oropharynx is clear and moist.  Eyes: Conjunctivae and EOM are  normal. Pupils are equal, round, and reactive to light.  Neck: Normal range of motion. Neck supple.  Cardiovascular: Normal rate, regular rhythm, normal heart sounds and intact distal pulses.   Pulmonary/Chest: Effort normal and breath sounds normal.  Abdominal: Soft. Bowel sounds are normal. She exhibits distension (abdominal obesity). There is no tenderness.  Skin: Skin is warm and dry.  Psychiatric: She has a normal mood and affect. Her behavior is normal. Judgment and thought content normal.          BP 129/62 mmHg  Pulse 66  Temp(Src) 98.3 F (36.8 C) (Oral)  Resp 16  Ht 5\' 6"  (1.676 m)  Wt 299 lb (135.626 kg)  BMI 48.28 kg/m2  LMP 08/06/2014 Assessment & Plan:  1. Metabolic syndrome Desiree Schultz currently has a BMI of 48 and abdominal obesity. I will check labs today. She is to return to office for fasting lipid panel. I do not have recent cholesterol results. Reviewed hemoglobin A1C, 6.1, which indicates that Desiree Schultz is prediabetic. She states that she exercises 3-4 days per week, but does not follow a balanced diet and eat 2-3 times per day.  -Check hemoglobin a1C - Fasting lipid panel - CBC with Differential - COMPLETE METABOLIC PANEL WITH GFR - Urinalysis, Complete  2. Polycystic disease, ovaries  Controlled on Metformin XL 500 mg daily. Last hemoglobin a1C was 6.1. I will continue Metformin. Desiree Schultz states that she ws previously on Prometrium for enlarged ovaries. She states that she did not return for follow-up ultrasound with Dr. Helane Rima. I will review notes as they become available.  - Hemoglobin A1c - metFORMIN (GLUCOPHAGE-XR) 500 MG 24 hr tablet; Take 1 tablet (500 mg total) by mouth daily.  Dispense: 30 tablet; Refill: 3  3. Hypothyroidism, unspecified hypothyroidism type Controlled on Levothyroxine 137 mcg. I will check TSH today. She is currently asymptomatic.  - TSH   4. Paroxysmal atrial fibrillation Patient is currently followed by Dr. Caryl Comes, cardiologist. She was last seen on 08/09/2014. Reviewed previous note EKG  (normal sinus rhythm) and echocardiogram. Estimated ejection fraction was between 55-60%. Patient to follow up with Dr. Caryl Comes as scheduled.   5. History of obstructive sleep apnea Patient currently sleeps with CPAP and gets 8-9 hours of uninterrupted sleep.  CPAP monitor managed by [facility].    6.Need for Tdap vaccination - Tdap vaccine greater than or equal to 7yo IM     Preventative care:  Patient is up to date  on vaccinations, mammogram, and pap smear RTC: 3 months  Hollis,Lachina M, FNP

## 2014-10-06 LAB — TSH: TSH: 1.918 u[IU]/mL (ref 0.350–4.500)

## 2014-10-06 LAB — URINALYSIS, COMPLETE
BILIRUBIN URINE: NEGATIVE
Bacteria, UA: NONE SEEN
Casts: NONE SEEN
Crystals: NONE SEEN
GLUCOSE, UA: NEGATIVE mg/dL
HGB URINE DIPSTICK: NEGATIVE
KETONES UR: NEGATIVE mg/dL
LEUKOCYTES UA: NEGATIVE
NITRITE: NEGATIVE
PROTEIN: NEGATIVE mg/dL
Squamous Epithelial / LPF: NONE SEEN
Urobilinogen, UA: 0.2 mg/dL (ref 0.0–1.0)
pH: 6 (ref 5.0–8.0)

## 2014-10-06 LAB — HEMOGLOBIN A1C
Hgb A1c MFr Bld: 5.8 % — ABNORMAL HIGH (ref <5.7)
MEAN PLASMA GLUCOSE: 120 mg/dL — AB (ref <117)

## 2014-10-07 ENCOUNTER — Encounter: Payer: Self-pay | Admitting: Family Medicine

## 2014-10-07 DIAGNOSIS — Z8669 Personal history of other diseases of the nervous system and sense organs: Secondary | ICD-10-CM | POA: Insufficient documentation

## 2014-11-01 ENCOUNTER — Emergency Department (HOSPITAL_COMMUNITY)
Admission: EM | Admit: 2014-11-01 | Discharge: 2014-11-01 | Disposition: A | Discharge status: Home / Self Care | Payer: No Typology Code available for payment source | Source: Home / Self Care | Attending: Family Medicine | Admitting: Family Medicine

## 2014-11-01 ENCOUNTER — Encounter (HOSPITAL_COMMUNITY): Payer: Self-pay | Admitting: Emergency Medicine

## 2014-11-01 DIAGNOSIS — J111 Influenza due to unidentified influenza virus with other respiratory manifestations: Secondary | ICD-10-CM

## 2014-11-01 DIAGNOSIS — R69 Illness, unspecified: Principal | ICD-10-CM

## 2014-11-01 MED ORDER — ACETAMINOPHEN 325 MG PO TABS
975.0000 mg | ORAL_TABLET | Freq: Once | ORAL | Status: AC
  Administered 2014-11-01: 975 mg via ORAL

## 2014-11-01 MED ORDER — BENZONATATE 100 MG PO CAPS
100.0000 mg | ORAL_CAPSULE | Freq: Three times a day (TID) | ORAL | Status: DC | PRN
Start: 2014-11-01 — End: 2014-12-28

## 2014-11-01 MED ORDER — ACETAMINOPHEN 325 MG PO TABS
ORAL_TABLET | ORAL | Status: AC
  Filled 2014-11-01: qty 3

## 2014-11-01 MED ORDER — IPRATROPIUM BROMIDE 0.06 % NA SOLN: 2.0000 | Freq: Four times a day (QID) | NASAL | Status: DC

## 2014-11-01 NOTE — ED Notes (Signed)
Pt states that she believes she has the flu. She has come in direct contact with a person who had the flu.

## 2014-11-01 NOTE — ED Provider Notes (Signed)
CSN: [id number]     Arrival date & time 11/01/14  0904 History   First MD Initiated Contact with Patient 11/01/14 0957     Chief Complaint  Patient presents with  . Fever  . Cough   (Consider location/radiation/quality/duration/timing/severity/associated sxs/prior Treatment) Patient is a 44 y.o. female presenting with URI. The history is provided by the patient.  URI Presenting symptoms: congestion, cough, fatigue and fever   Severity:  Moderate Onset quality:  Gradual Duration:  24 hours Timing:  Constant Progression:  Worsening Chronicity:  New Associated symptoms: headaches and myalgias   Associated symptoms: no wheezing   Risk factors: sick contacts     Past Medical History  Diagnosis Date  . Atrial fibrillation     2006  . Menorrhagia     2011  . Hypothyroid   . Prediabetes   . Depression   . PCOS (polycystic ovarian syndrome)   . OSA (obstructive sleep apnea)     on CPAP  . Obesity (BMI 30-39.9)   . Chronic anxiety   . Vitamin D deficiency   . Allergic rhinitis   . Hx of cardiovascular stress test     a. Myoview 10/13:  no ischemia; EF 57%  . Elevated blood pressure     On therapy. Probably hypertension   Past Surgical History  Procedure Laterality Date  . Endometrial ablation    . Dilation and curettage, diagnostic / therapeutic    . Tonsillectomy  2007  . Bladder surgery  1997    interstitial cystitis   . Hand surgery  2006    due to cat bite  . Renal biopsy, open  2006  . Endometrial ablation  08/2010  . Ovarian cyst removal  04/28/13   Family History  Problem Relation Age of Onset  . Cardiomyopathy Father   . Heart disease Father   . Hyperlipidemia Father   . Hypertension Father   . Hypertension      fathers side  . Hyperlipidemia      fathers side  . Heart failure      both grandparents - paternal  . Coronary artery disease Maternal Grandmother   . Heart attack Maternal Grandmother     and other maternal family members  . Heart attack  Maternal Grandfather    History  Substance Use Topics  . Smoking status: Former Smoker -- 2.00 packs/day for 16 years    Types: Cigarettes    Quit date: 09/03/1998  . Smokeless tobacco: Not on file  . Alcohol Use: Yes     Comment: maybe 2 a mth   OB History    No data available     Review of Systems  Constitutional: Positive for fever, chills and fatigue.  HENT: Positive for congestion and postnasal drip.   Eyes: Negative.   Respiratory: Positive for cough. Negative for chest tightness, shortness of breath and wheezing.   Cardiovascular: Negative.   Gastrointestinal: Negative.   Musculoskeletal: Positive for myalgias.  Neurological: Positive for headaches.    Allergies  Benadryl; Fish oil; Influenza vaccines; Latex; Sulfur; and Tape  Home Medications   Prior to Admission medications   Medication Sig Start Date End Date Taking? Authorizing Provider  Ascorbic Acid (VITAMIN C) 1000 MG tablet Take 1,000 mg by mouth daily.    Historical Provider, MD  benzonatate (TESSALON) 100 MG capsule Take 1 capsule (100 mg total) by mouth 3 (three) times daily as needed for cough. 11/01/14   Lutricia Feil, PA  cetirizine (  ZYRTEC) 10 MG tablet Take 10 mg by mouth daily.      Historical Provider, MD  cholecalciferol (VITAMIN D) 400 UNITS TABS tablet Take 400 Units by mouth daily.    Historical Provider, MD  citalopram (CELEXA) 40 MG tablet 1 and 1/2 tabs once daily (=60mg )    Historical Provider, MD  ELIQUIS 5 MG TABS tablet TAKE 1 TABLET BY MOUTH TWICE DAILY 06/15/14   Deboraha Sprang, MD  ipratropium (ATROVENT) 0.06 % nasal spray Place 2 sprays into both nostrils 4 (four) times daily. 11/01/14   Lutricia Feil, PA  levothyroxine (SYNTHROID) 137 MCG tablet Take 137 mcg by mouth daily before breakfast.    Historical Provider, MD  metFORMIN (GLUCOPHAGE-XR) 500 MG 24 hr tablet Take 1 tablet (500 mg total) by mouth daily. 10/05/14   Dorena Dew, FNP  metoprolol succinate  (TOPROL-XL) 25 MG 24 hr tablet TAKE 1 TABLET BY MOUTH DAILY Patient taking differently: Take 12.5 mg by mouth daily. TAKE 1 TABLET BY MOUTH DAILY 05/19/14   Deboraha Sprang, MD  progesterone (PROMETRIUM) 200 MG capsule Take 200 mg by mouth daily.    Historical Provider, MD  propafenone (RYTHMOL) 225 MG tablet TAKE 1 TABLET TWICE A DAY (NEW MEDICATION) 08/19/14   Deboraha Sprang, MD  spironolactone (ALDACTONE) 50 MG tablet Take 1 tablet (50 mg total) by mouth daily. 02/15/14   Deboraha Sprang, MD  VITAMIN K PO Take 1 tablet by mouth daily.    Historical Provider, MD   BP 133/81 mmHg  Pulse 87  Temp(Src) 100.5 F (38.1 C) (Oral)  Resp 16  SpO2 96%  LMP 09/22/2014 Physical Exam  Constitutional: She is oriented to person, place, and time. She appears well-developed and well-nourished. No distress.  HENT:  Head: Normocephalic and atraumatic.  Right Ear: Hearing, tympanic membrane, external ear and ear canal normal.  Left Ear: Hearing, tympanic membrane, external ear and ear canal normal.  Nose: Nose normal.  Mouth/Throat: Uvula is midline, oropharynx is clear and moist and mucous membranes are normal.  Eyes: Conjunctivae are normal. No scleral icterus.  Neck: Normal range of motion. Neck supple.  Cardiovascular: Normal rate, regular rhythm and normal heart sounds.   Pulmonary/Chest: Effort normal and breath sounds normal. No respiratory distress. She has no wheezes.  Musculoskeletal: Normal range of motion.  Lymphadenopathy:    She has no cervical adenopathy.  Neurological: She is alert and oriented to person, place, and time.  Skin: Skin is warm and dry.  Psychiatric: She has a normal mood and affect. Her behavior is normal.  Nursing note and vitals reviewed.   ED Course  Procedures (including critical care time) Labs Review Labs Reviewed - No data to display  Imaging Review No results found.   MDM   1. Influenza-like illness    Patient declined Rx for tamiflu Will provide Rx  for Atrovent nasal spray as she reports excessive PND and tessalon for cough Rest, fluids, tylenol at home     [employer], [place] 11/01/14 [phone]

## 2014-11-01 NOTE — Discharge Instructions (Signed)

## 2014-11-09 ENCOUNTER — Other Ambulatory Visit: Payer: Self-pay

## 2014-11-09 ENCOUNTER — Telehealth: Payer: Self-pay | Admitting: Obstetrics and Gynecology

## 2014-11-09 MED ORDER — SPIRONOLACTONE 50 MG PO TABS: 50.0000 mg | ORAL_TABLET | Freq: Every day | ORAL | Status: DC

## 2014-11-09 MED ORDER — LEVOTHYROXINE SODIUM 137 MCG PO TABS: 137.0000 ug | ORAL_TABLET | Freq: Every day | ORAL | Status: DC

## 2014-11-09 MED ORDER — PROGESTERONE MICRONIZED 200 MG PO CAPS: 200.0000 mg | ORAL_CAPSULE | Freq: Every day | ORAL | Status: DC

## 2014-11-09 NOTE — Telephone Encounter (Signed)
Patient called requesting refills which have been done and sent in via e-script. Thanks!

## 2014-11-19 ENCOUNTER — Telehealth: Payer: Self-pay | Admitting: Internal Medicine

## 2014-11-19 NOTE — Telephone Encounter (Signed)
Patient called to have name brand of levothyroxine (SYNTHROID) 137 MCG tablet called into [facility] and a more cost effective alternative to the progesterone.

## 2014-11-23 ENCOUNTER — Ambulatory Visit: Payer: No Typology Code available for payment source | Attending: Internal Medicine

## 2014-11-29 ENCOUNTER — Other Ambulatory Visit: Payer: No Typology Code available for payment source

## 2014-12-09 ENCOUNTER — Encounter: Payer: Self-pay | Admitting: Family Medicine

## 2014-12-09 ENCOUNTER — Ambulatory Visit (INDEPENDENT_AMBULATORY_CARE_PROVIDER_SITE_OTHER): Payer: No Typology Code available for payment source | Admitting: Family Medicine

## 2014-12-09 ENCOUNTER — Other Ambulatory Visit: Payer: Self-pay | Admitting: Internal Medicine

## 2014-12-09 VITALS — BP 120/73 | HR 73 | Temp 98.2°F | Resp 16 | Ht 66.0 in | Wt 298.0 lb

## 2014-12-09 DIAGNOSIS — E282 Polycystic ovarian syndrome: Secondary | ICD-10-CM

## 2014-12-09 DIAGNOSIS — E039 Hypothyroidism, unspecified: Secondary | ICD-10-CM

## 2014-12-09 DIAGNOSIS — M25462 Effusion, left knee: Secondary | ICD-10-CM

## 2014-12-09 DIAGNOSIS — M25562 Pain in left knee: Secondary | ICD-10-CM

## 2014-12-09 NOTE — Progress Notes (Signed)
Subjective:    Patient ID: Desiree Schultz, female    DOB: [birth year], 44 y.o.   MRN: [id number]  HPI Desiree Schultz, 44 year old female presents complaining of pain in the left knee that is  unresponsive to pain medication. Patient presents with pain involving the left knee,  Onset of the symptoms was several months ago. Inciting events include aerobic exercise class and  injured while performing moderate impact cardiovascular activity. Current symptoms include pain located left lateral aspect of knee. Pain is aggravated by going up and down stairs, kneeling, lateral movements, rising after sitting, squatting, standing and walking.  Patient has had prior knee problems. Evaluation to date: plain films: with no acute findings. Treatment to date: avoidance of offending activity, ice and OTC analgesics which are not very effective. Current pain intensity is 5/5, sharp, and intermittent.   Past Medical History  Diagnosis Date  . Atrial fibrillation     2006  . Menorrhagia     2011  . Hypothyroid   . Prediabetes   . Depression   . PCOS (polycystic ovarian syndrome)   . OSA (obstructive sleep apnea)     on CPAP  . Obesity (BMI 30-39.9)   . Chronic anxiety   . Vitamin D deficiency   . Allergic rhinitis   . Hx of cardiovascular stress test     a. Myoview 10/13:  no ischemia; EF 57%  . Elevated blood pressure     On therapy. Probably hypertension   History   Social History  . Marital Status: Single    Spouse Name: N/A  . Number of Children: N/A  . 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 Smoking status: Former Smoker -- 2.00 packs/day for 16 years    Types: Cigarettes    Quit date: 09/03/1998  . Smokeless tobacco: Not on file  . Alcohol Use: Yes     Comment: maybe 2 a mth  . Drug Use: Yes    Special: Marijuana     Comment: in high school -- none since  . Sexual Activity: Not on file   Other Topics Concern  .  Not on file   Social History Narrative   Pt lives in [place] with friend.  Works as a Designer, jewellery   Allergies  Allergen Reactions  . Benadryl [Diphenhydramine Hcl]     itching  . Fish Oil     Makes hands raw, mouth and female area   . Influenza Vaccines     Gives pt the flu and site of injection swells and hot  . Latex     Burns skin  . Sulfur     Bladder spasms, mouth raw  . Tape     Burns skin, band-aid, ekg stickers    Review of Systems  Constitutional: Negative.   HENT: Negative.   Eyes: Negative.   Respiratory: Negative.   Cardiovascular: Negative.   Gastrointestinal: Negative.   Endocrine: Negative.   Genitourinary: Negative.   Musculoskeletal: Positive for myalgias (left knee pain. ).  Skin: Negative.   Neurological: Negative.   Hematological: Negative.   Psychiatric/Behavioral: Negative.        Objective:   Physical Exam  Constitutional: She is oriented to person, place, and time. She appears well-developed and well-nourished.  HENT:  Head: Normocephalic and atraumatic.  Right Ear: External ear normal.  Left Ear: External ear normal.  Mouth/Throat: Oropharynx is clear  and moist.  Eyes: Conjunctivae and EOM are normal. Pupils are equal, round, and reactive to light.  Neck: Normal range of motion. Neck supple.  Cardiovascular: Normal rate, normal heart sounds and intact distal pulses.   Pulmonary/Chest: Effort normal and breath sounds normal.  Abdominal: Soft. Bowel sounds are normal.  Musculoskeletal:       Left knee: She exhibits decreased range of motion, swelling and abnormal patellar mobility. She exhibits no ecchymosis. Tenderness found. No patellar tendon tenderness noted.  Neurological: She is alert and oriented to person, place, and time. She has normal reflexes.  Skin: Skin is warm and dry.  Psychiatric: She has a normal mood and affect. Her behavior is normal. Judgment and thought content normal.       BP 120/73 mmHg  Pulse 73  Temp(Src)  98.2 F (36.8 C) (Oral)  Resp 16  Ht 5\' 6"  (1.676 m)  Wt 298 lb (135.172 kg)  BMI 48.12 kg/m2  LMP 11/27/2014 Assessment & Plan:    1. Left knee pain Desiree Schultz maintains that she had plan films at chiropractors office 2 weeks ago, which showed not acute findings with adequate spacing. She currently has a positive posterior drawer test with 4/5 strength to left leg. Also, positive ballotment noted. Patient is currently unable to take NSAIDs due to cardiac history. I will give an IM injection of Kenolog 40 mg times one. Recommend Tylenol 500 mg BID as needed for mild to moderate pain. Apply ice compress to left knee for 20 minutes 4 times per day, use interchangeably with warm, moist compress.  - triamcinolone acetonide (KENALOG-40) injection 40 mg; Inject 1 mL (40 mg total) into the muscle once.  2. Knee swelling, left Elevate left lower extremity to heart level while at rest.  - triamcinolone acetonide (KENALOG-40) injection 40 mg; Inject 1 mL (40 mg total) into the muscle once.   3. Polycystic disease, ovaries Currently controlled on Metformin XR daily. Previous hemoglobin a1C is 5.8, which had decreased from 6.2 per patient.   4. Hypothyroidism, unspecified hypothyroidism type At goal on current medication regimen. Reviewed previous TSH, 1.9.    Return to clinic in 1 week for follow up of left knee pain   Aunica Dauphinee M, FNP

## 2014-12-09 NOTE — Patient Instructions (Addendum)

## 2014-12-11 MED ORDER — TRIAMCINOLONE ACETONIDE 40 MG/ML IJ SUSP
40.0000 mg | Freq: Once | INTRAMUSCULAR | Status: AC
  Administered 2014-12-09: 40 mg via INTRAMUSCULAR

## 2014-12-16 ENCOUNTER — Other Ambulatory Visit: Payer: Self-pay | Admitting: Family Medicine

## 2014-12-16 ENCOUNTER — Ambulatory Visit (INDEPENDENT_AMBULATORY_CARE_PROVIDER_SITE_OTHER): Payer: No Typology Code available for payment source | Admitting: Family Medicine

## 2014-12-16 ENCOUNTER — Encounter: Payer: Self-pay | Admitting: Obstetrics and Gynecology

## 2014-12-16 VITALS — BP 122/69 | HR 60 | Temp 98.4°F | Resp 16 | Ht 66.0 in | Wt 300.0 lb

## 2014-12-16 DIAGNOSIS — E039 Hypothyroidism, unspecified: Secondary | ICD-10-CM

## 2014-12-16 DIAGNOSIS — M25462 Effusion, left knee: Secondary | ICD-10-CM

## 2014-12-16 DIAGNOSIS — F329 Major depressive disorder, single episode, unspecified: Secondary | ICD-10-CM

## 2014-12-16 DIAGNOSIS — M25562 Pain in left knee: Secondary | ICD-10-CM

## 2014-12-16 DIAGNOSIS — E282 Polycystic ovarian syndrome: Secondary | ICD-10-CM

## 2014-12-16 DIAGNOSIS — F32A Depression, unspecified: Secondary | ICD-10-CM | POA: Insufficient documentation

## 2014-12-16 MED ORDER — CITALOPRAM HYDROBROMIDE 40 MG PO TABS: 60.0000 mg | ORAL_TABLET | Freq: Every day | ORAL | Status: DC

## 2014-12-16 NOTE — Progress Notes (Signed)
Subjective:    Patient ID: Desiree Schultz, female    DOB: 03/03/1971, 44 y.o.   MRN: [id number]  HPI Ms. Desiree Schultz, 44 year old female presents complaining of pain in the left knee that is minimally responsive to current interventions. Patient has pain involving the left knee,  Onset of the symptoms was several months ago. Inciting events include aerobic exercise class and  injured while performing moderate impact cardiovascular activity. Current symptoms include pain located left lateral aspect of knee. Pain is aggravated by going up and down stairs, kneeling, lateral movements, rising after sitting, squatting, standing and walking.  Patient has had prior knee problems. Evaluation to date: plain films: with no acute findings. Treatment to date: avoidance of offending activity, ice and OTC analgesics which are not very effective. Current pain intensity is 3/5, aching, and intermittent.    Past Medical History  Diagnosis Date  . Atrial fibrillation     2006  . Menorrhagia     2011  . Hypothyroid   . Prediabetes   . Depression   . PCOS (polycystic ovarian syndrome)   . OSA (obstructive sleep apnea)     on CPAP  . Obesity (BMI 30-39.9)   . Chronic anxiety   . Vitamin D deficiency   . Allergic rhinitis   . Hx of cardiovascular stress test     a. Myoview 10/13:  no ischemia; EF 57%  . Elevated blood pressure     On therapy. Probably hypertension   History   Social History  . Marital Status: Single    Spouse Name: N/A  . Number of Children: N/A  . 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 Smoking status: Former Smoker -- 2.00 packs/day for 16 years    Types: Cigarettes    Quit date: 09/03/1998  . Smokeless tobacco: Not on file  . Alcohol Use: Yes     Comment: maybe 2 a mth  . Drug Use: Yes    Special: Marijuana     Comment: in high school -- none since  . Sexual Activity: Not on file   Other Topics  Concern  . Not on file   Social History Narrative   Pt lives in [place] with friend.  Works as a Designer, jewellery   Allergies  Allergen Reactions  . Benadryl [Diphenhydramine Hcl]     itching  . Fish Oil     Makes hands raw, mouth and female area   . Influenza Vaccines     Gives pt the flu and site of injection swells and hot  . Latex     Burns skin  . Sulfur     Bladder spasms, mouth raw  . Tape     Burns skin, band-aid, ekg stickers    Review of Systems  Constitutional: Negative.   HENT: Negative.   Eyes: Negative.   Respiratory: Negative.   Cardiovascular: Negative.   Gastrointestinal: Negative.   Endocrine: Negative.   Genitourinary: Negative.   Musculoskeletal: Positive for myalgias (left knee pain. ).  Skin: Negative.   Neurological: Negative.   Hematological: Negative.   Psychiatric/Behavioral: Negative.        Objective:   Physical Exam  Constitutional: She is oriented to person, place, and time. She appears well-developed and well-nourished.  HENT:  Head: Normocephalic and atraumatic.  Right Ear: External ear normal.  Left Ear: External ear normal.  Mouth/Throat: Oropharynx is clear  and moist.  Eyes: Conjunctivae and EOM are normal. Pupils are equal, round, and reactive to light.  Neck: Normal range of motion. Neck supple.  Cardiovascular: Normal rate, normal heart sounds and intact distal pulses.   Pulmonary/Chest: Effort normal and breath sounds normal.  Abdominal: Soft. Bowel sounds are normal.  Musculoskeletal:       Left knee: She exhibits decreased range of motion, swelling and abnormal patellar mobility. She exhibits no ecchymosis. Tenderness found. No patellar tendon tenderness noted.  Neurological: She is alert and oriented to person, place, and time. She has normal reflexes.  Skin: Skin is warm and dry.  Psychiatric: She has a normal mood and affect. Her behavior is normal. Judgment and thought content normal.       BP 122/69 mmHg  Pulse 60   Temp(Src) 98.4 F (36.9 C) (Oral)  Resp 16  Ht 5\' 6"  (1.676 m)  Wt 300 lb (136.079 kg)  BMI 48.44 kg/m2  LMP 11/27/2014 Assessment & Plan:   1. Left knee pain Desiree Schultz maintains that she had plan films at chiropractors office 3 weeks ago, which showed not acute findings with adequate spacing. She currently has a positive posterior drawer test with 4/5 strength to left leg. Also, positive ballotment noted. Patient is currently unable to take NSAIDs due to cardiac history. Desiree Schultz received an  IM injection of Kenolog 40 mg times one 1 week ago with moderate relief. She states that pain continues to occur primarily when going from sitting to standing and up and down stairs. . Recommend Tylenol 500 mg BID as needed for mild to moderate pain. Continue to  apply ice compress to left knee for 20 minutes 4 times per day, use interchangeably with warm, moist compress.   - AMB referral to orthopedics 2. Knee swelling, left Elevate left lower extremity to heart level while at rest.  3. Polycystic disease, ovaries Currently controlled on Metformin XR daily. Previous hemoglobin a1C is 5.8, which had decreased from 6.2 per patient.   4. Hypothyroidism, unspecified hypothyroidism type At goal on current medication regimen. Reviewed previous TSH, 1.9. Patient requesting to increase dose of levothyroxine. Explained hypothyroidism at length. Desiree Schultz had a poor understanding of disease process and how levothyroxine works in the body. We discussed medication at length and patient understands that she is on the appropriate dosage of levothyroxine.   Return to clinic as previously scheduled with Dr. Lawson Fiscal, FNP

## 2014-12-17 ENCOUNTER — Other Ambulatory Visit: Payer: Self-pay

## 2014-12-17 DIAGNOSIS — E282 Polycystic ovarian syndrome: Secondary | ICD-10-CM

## 2014-12-17 MED ORDER — PROPAFENONE HCL 225 MG PO TABS: ORAL_TABLET | ORAL | Status: DC

## 2014-12-17 MED ORDER — METOPROLOL SUCCINATE ER 25 MG PO TB24: 12.5000 mg | ORAL_TABLET | Freq: Every day | ORAL | Status: DC

## 2014-12-17 MED ORDER — APIXABAN 5 MG PO TABS: 5.0000 mg | ORAL_TABLET | Freq: Two times a day (BID) | ORAL | Status: DC

## 2014-12-17 MED ORDER — METOPROLOL SUCCINATE ER 25 MG PO TB24: 25.0000 mg | ORAL_TABLET | Freq: Every day | ORAL | Status: DC

## 2014-12-21 ENCOUNTER — Encounter: Payer: Self-pay | Admitting: Family Medicine

## 2014-12-21 DIAGNOSIS — M25469 Effusion, unspecified knee: Secondary | ICD-10-CM | POA: Insufficient documentation

## 2014-12-21 DIAGNOSIS — M25562 Pain in left knee: Secondary | ICD-10-CM | POA: Insufficient documentation

## 2014-12-22 ENCOUNTER — Other Ambulatory Visit: Payer: Self-pay | Admitting: Internal Medicine

## 2014-12-24 ENCOUNTER — Other Ambulatory Visit: Payer: Self-pay | Admitting: Internal Medicine

## 2014-12-24 MED ORDER — LEVOTHYROXINE SODIUM 137 MCG PO TABS: 137.0000 ug | ORAL_TABLET | Freq: Every day | ORAL | Status: DC

## 2014-12-28 ENCOUNTER — Encounter (HOSPITAL_COMMUNITY): Payer: Self-pay | Admitting: Emergency Medicine

## 2014-12-28 ENCOUNTER — Emergency Department (HOSPITAL_COMMUNITY)
Admission: EM | Admit: 2014-12-28 | Discharge: 2014-12-28 | Disposition: A | Discharge status: Home / Self Care | Payer: No Typology Code available for payment source | Source: Home / Self Care | Attending: Family Medicine | Admitting: Family Medicine

## 2014-12-28 DIAGNOSIS — R51 Headache: Secondary | ICD-10-CM

## 2014-12-28 DIAGNOSIS — R519 Headache, unspecified: Secondary | ICD-10-CM

## 2014-12-28 DIAGNOSIS — J309 Allergic rhinitis, unspecified: Secondary | ICD-10-CM

## 2014-12-28 DIAGNOSIS — J9801 Acute bronchospasm: Secondary | ICD-10-CM

## 2014-12-28 DIAGNOSIS — R0982 Postnasal drip: Secondary | ICD-10-CM

## 2014-12-28 MED ORDER — AMOXICILLIN 500 MG PO CAPS: 1000.0000 mg | ORAL_CAPSULE | Freq: Two times a day (BID) | ORAL | Status: DC

## 2014-12-28 NOTE — Discharge Instructions (Signed)
Sinus Headache A sinus headache is when your sinuses become clogged or swollen. Sinus headaches can range from mild to severe.  CAUSES A sinus headache can have different causes, such as:  Colds.  Sinus infections.  Allergies. SYMPTOMS  Symptoms of a sinus headache may vary and can include:  Headache.  Pain or pressure in the face.  Congested or runny nose.  Fever.  Inability to smell.  Pain in upper teeth. Weather changes can make symptoms worse. TREATMENT  The treatment of a sinus headache depends on the cause.  Sinus pain caused by a sinus infection may be treated with antibiotic medicine.  Sinus pain caused by allergies may be helped by allergy medicines (antihistamines) and medicated nasal sprays.  Sinus pain caused by congestion may be helped by flushing the nose and sinuses with saline solution. HOME CARE INSTRUCTIONS   If antibiotics are prescribed, take them as directed. Finish them even if you start to feel better.  Only take over-the-counter or prescription medicines for pain, discomfort, or fever as directed by your caregiver.  If you have congestion, use a nasal spray to help reduce pressure. SEEK IMMEDIATE MEDICAL CARE IF:  You have a fever.  You have headaches more than once a week.  You have sensitivity to light or sound.  You have repeated nausea and vomiting.  You have vision problems.  You have sudden, severe pain in your face or head.  You have a seizure.  You are confused.  Your sinus headaches do not get better after treatment. Many people think they have a sinus headache when they actually have migraines or tension headaches. MAKE SURE YOU:   Understand these instructions.  Will watch your condition.  Will get help right away if you are not doing well or get worse. Document Released: 09/27/2004 Document Revised: 11/12/2011 Document Reviewed: 11/18/2010 [facility] Patient Information 2015 [place], [place]. This information is not  intended to replace advice given to you by your health care provider. Make sure you discuss any questions you have with your health care provider.  Bronchospasm A bronchospasm is when the tubes that carry air in and out of your lungs (airways) spasm or tighten. During a bronchospasm it is hard to breathe. This is because the airways get smaller. A bronchospasm can be triggered by:  Allergies. These may be to animals, pollen, food, or mold.  Infection. This is a common cause of bronchospasm.  Exercise.  Irritants. These include pollution, cigarette smoke, strong odors, aerosol sprays, and paint fumes.  Weather changes.  Stress.  Being emotional. HOME CARE   Always have a plan for getting help. Know when to call your doctor and local emergency services (911 in the [place] Know where you can get emergency care.  Only take medicines as told by your doctor.  If you were prescribed an inhaler or nebulizer machine, ask your doctor how to use it correctly. Always use a spacer with your inhaler if you were given one.  Stay calm during an attack. Try to relax and breathe more slowly.  Control your home environment:  Change your heating and air conditioning filter at least once a month.  Limit your use of fireplaces and wood stoves.  Do not  smoke. Do not  allow smoking in your home.  Avoid perfumes and fragrances.  Get rid of pests (such as roaches and mice) and their droppings.  Throw away plants if you see mold on them.  Keep your house clean and dust free.  Replace carpet with wood, tile, or vinyl flooring. Carpet can trap dander and dust.  Use allergy-proof pillows, mattress covers, and box spring covers.  Wash bed sheets and blankets every week in hot water. Dry them in a dryer.  Use blankets that are made of polyester or cotton.  Wash hands frequently. GET HELP IF:  You have muscle aches.  You have chest pain.  The thick spit you spit or cough up (sputum) changes  from clear or white to yellow, green, gray, or bloody.  The thick spit you spit or cough up gets thicker.  There are problems that may be related to the medicine you are given such as:  A rash.  Itching.  Swelling.  Trouble breathing. GET HELP RIGHT AWAY IF:  You feel you cannot breathe or catch your breath.  You cannot stop coughing.  Your treatment is not helping you breathe better.  You have very bad chest pain. MAKE SURE YOU:   Understand these instructions.  Will watch your condition.  Will get help right away if you are not doing well or get worse. Document Released: 06/17/2009 Document Revised: 08/25/2013 Document Reviewed: 02/10/2013 [facility] Patient Information 2015 [place], [place]. This information is not intended to replace advice given to you by your health care provider. Make sure you discuss any questions you have with your health care provider.  Sinusitis Sinusitis is redness, soreness, and puffiness (inflammation) of the air pockets in the bones of your face (sinuses). The redness, soreness, and puffiness can cause air and mucus to get trapped in your sinuses. This can allow germs to grow and cause an infection.  HOME CARE   Drink enough fluids to keep your pee (urine) clear or pale yellow.  Use a humidifier in your home.  Run a hot shower to create steam in the bathroom. Sit in the bathroom with the door closed. Breathe in the steam 3-4 times a day.  Put a warm, moist washcloth on your face 3-4 times a day, or as told by your doctor.  Use salt water sprays (saline sprays) to wet the thick fluid in your nose. This can help the sinuses drain.  Only take medicine as told by your doctor. GET HELP RIGHT AWAY IF:   Your pain gets worse.  You have very bad headaches.  You are sick to your stomach (nauseous).  You throw up (vomit).  You are very sleepy (drowsy) all the time.  Your face is puffy (swollen).  Your vision changes.  You have a stiff  neck.  You have trouble breathing. MAKE SURE YOU:   Understand these instructions.  Will watch your condition.  Will get help right away if you are not doing well or get worse. Document Released: 02/06/2008 Document Revised: 05/14/2012 Document Reviewed: 03/25/2012 [facility] Patient Information 2015 [place], [place]. This information is not intended to replace advice given to you by your health care provider. Make sure you discuss any questions you have with your health care provider.  Allergic Rhinitis Allergic rhinitis is when the mucous membranes in the nose respond to allergens. Allergens are particles in the air that cause your body to have an allergic reaction. This causes you to release allergic antibodies. Through a chain of events, these eventually cause you to release histamine into the blood stream. Although meant to protect the body, it is this release of histamine that causes your discomfort, such as frequent sneezing, congestion, and an itchy, runny nose.  CAUSES  Seasonal allergic rhinitis (hay  fever) is caused by pollen allergens that may come from grasses, trees, and weeds. Year-round allergic rhinitis (perennial allergic rhinitis) is caused by allergens such as house dust mites, pet dander, and mold spores.  SYMPTOMS   Nasal stuffiness (congestion).  Itchy, runny nose with sneezing and tearing of the eyes. DIAGNOSIS  Your health care provider can help you determine the allergen or allergens that trigger your symptoms. If you and your health care provider are unable to determine the allergen, skin or blood testing may be used. TREATMENT  Allergic rhinitis does not have a cure, but it can be controlled by:  Medicines and allergy shots (immunotherapy).  Avoiding the allergen. Hay fever may often be treated with antihistamines in pill or nasal spray forms. Antihistamines block the effects of histamine. There are over-the-counter medicines that may help with nasal congestion  and swelling around the eyes. Check with your health care provider before taking or giving this medicine.  If avoiding the allergen or the medicine prescribed do not work, there are many new medicines your health care provider can prescribe. Stronger medicine may be used if initial measures are ineffective. Desensitizing injections can be used if medicine and avoidance does not work. Desensitization is when a patient is given ongoing shots until the body becomes less sensitive to the allergen. Make sure you follow up with your health care provider if problems continue. HOME CARE INSTRUCTIONS It is not possible to completely avoid allergens, but you can reduce your symptoms by taking steps to limit your exposure to them. It helps to know exactly what you are allergic to so that you can avoid your specific triggers. SEEK MEDICAL CARE IF:   You have a fever.  You develop a cough that does not stop easily (persistent).  You have shortness of breath.  You start wheezing.  Symptoms interfere with normal daily activities. Document Released: 05/15/2001 Document Revised: 08/25/2013 Document Reviewed: 04/27/2013 [facility] Patient Information 2015 [place], [place]. This information is not intended to replace advice given to you by your health care provider. Make sure you discuss any questions you have with your health care provider.

## 2014-12-28 NOTE — ED Provider Notes (Signed)
CSN: [id number]     Arrival date & time 12/28/14  6433 History   First MD Initiated Contact with Patient 12/28/14 [phone]     No chief complaint on file.  (Consider location/radiation/quality/duration/timing/severity/associated sxs/prior Treatment) HPI Comments: 44 year old morbidly obese female is complaining of 46 week history of PND, eyes hurting, frontal headache and facial pain, scalp pain, wheezing in the throat and fevers of 100.3 -.7. Later recanting stating that it was 101.7. She denies shortness of breath. Her only medicine has been Tylenol. She states that she is certain that she has a bacterial sinus infection and that "something is wrong". She states she never has headaches and she never has persistent drainage and many other of the symptoms. She was told nearly 6 weeks ago that she had a cold and to take a particular medicine that the pharmacist refused to fill for her because she had a history of atrial fibrillation.   Past Medical History  Diagnosis Date  . Atrial fibrillation     2006  . Menorrhagia     2011  . Hypothyroid   . Prediabetes   . Depression   . PCOS (polycystic ovarian syndrome)   . OSA (obstructive sleep apnea)     on CPAP  . Obesity (BMI 30-39.9)   . Chronic anxiety   . Vitamin D deficiency   . Allergic rhinitis   . Hx of cardiovascular stress test     a. Myoview 10/13:  no ischemia; EF 57%  . Elevated blood pressure     On therapy. Probably hypertension   Past Surgical History  Procedure Laterality Date  . Endometrial ablation    . Dilation and curettage, diagnostic / therapeutic    . Tonsillectomy  2007  . Bladder surgery  1997    interstitial cystitis   . Hand surgery  2006    due to cat bite  . Renal biopsy, open  2006  . Endometrial ablation  08/2010  . Ovarian cyst removal  04/28/13   Family History  Problem Relation Age of Onset  . Cardiomyopathy Father   . Heart disease Father   . Hyperlipidemia Father   . Hypertension Father   .  Hypertension      fathers side  . Hyperlipidemia      fathers side  . Heart failure      both grandparents - paternal  . Coronary artery disease Maternal Grandmother   . Heart attack Maternal Grandmother     and other maternal family members  . Heart attack Maternal Grandfather    History  Substance Use Topics  . Smoking status: Former Smoker -- 2.00 packs/day for 16 years    Types: Cigarettes    Quit date: 09/03/1998  . Smokeless tobacco: Not on file  . Alcohol Use: Yes     Comment: maybe 2 a mth   OB History    No data available     Review of Systems  Constitutional: Positive for fever and activity change.  HENT: Positive for congestion, postnasal drip and sinus pressure. Negative for ear pain and sore throat.   Eyes: Positive for pain. Negative for visual disturbance.  Respiratory: Negative for cough, chest tightness and shortness of breath.   Cardiovascular: Negative for chest pain.  Gastrointestinal: Negative.   Skin: Negative.   Neurological: Negative.     Allergies  Benadryl; Fish oil; Influenza vaccines; Latex; Sulfur; and Tape  Home Medications   Prior to Admission medications   Medication Sig Start Date  End Date Taking? Authorizing Provider  apixaban (ELIQUIS) 5 MG TABS tablet Take 1 tablet (5 mg total) by mouth 2 (two) times daily. 12/17/14  Yes Deboraha Sprang, MD  Ascorbic Acid (VITAMIN C) 1000 MG tablet Take 1,000 mg by mouth daily.   Yes Historical Provider, MD  cetirizine (ZYRTEC) 10 MG tablet Take 10 mg by mouth daily.     Yes Historical Provider, MD  cholecalciferol (VITAMIN D) 400 UNITS TABS tablet Take 400 Units by mouth daily.   Yes Historical Provider, MD  citalopram (CELEXA) 40 MG tablet Take 1.5 tablets (60 mg total) by mouth daily. 1 and 1/2 tabs once daily (=60mg ) 12/16/14  Yes Dorena Dew, FNP  levothyroxine (SYNTHROID) 137 MCG tablet Take 1 tablet (137 mcg total) by mouth daily before breakfast. 12/24/14  Yes Tresa Garter, MD   metFORMIN (GLUCOPHAGE-XR) 500 MG 24 hr tablet Take 1 tablet (500 mg total) by mouth daily. 10/05/14  Yes Dorena Dew, FNP  metoprolol succinate (TOPROL-XL) 25 MG 24 hr tablet Take 1 tablet (25 mg total) by mouth daily. 12/17/14  Yes Sherren Mocha, MD  Multiple Vitamin (MULTIVITAMIN) capsule Take 1 capsule by mouth daily.   Yes Historical Provider, MD  propafenone (RYTHMOL) 225 MG tablet TAKE 1 TABLET TWICE A DAY (NEW MEDICATION) 12/17/14  Yes Deboraha Sprang, MD  spironolactone (ALDACTONE) 50 MG tablet Take 1 tablet (50 mg total) by mouth daily. 11/09/14  Yes Dorena Dew, FNP  VITAMIN K PO Take 1 tablet by mouth daily.   Yes Historical Provider, MD  amoxicillin (AMOXIL) 500 MG capsule Take 2 capsules (1,000 mg total) by mouth 2 (two) times daily. 12/28/14   [name] Napoleon, NP   BP 126/87 mmHg  Pulse 86  Temp(Src) 99.1 F (37.3 C) (Oral)  Resp 16  SpO2 96%  LMP 12/22/2014 Physical Exam  Constitutional: She is oriented to person, place, and time. She appears well-developed and well-nourished. No distress.  Obese.  HENT:  Bilateral TMs without erythema or effusion Oropharynx with minor pallor and clear PND. No erythema or exudates.  Eyes: Conjunctivae and EOM are normal. Pupils are equal, round, and reactive to light.  Neck: Normal range of motion. Neck supple.  Cardiovascular: Normal rate, regular rhythm and normal heart sounds.   Pulmonary/Chest: Effort normal. No respiratory distress. She has wheezes.  Musculoskeletal: Normal range of motion. She exhibits no edema.  Lymphadenopathy:    She has no cervical adenopathy.  Neurological: She is alert and oriented to person, place, and time. No cranial nerve deficit.  Skin: Skin is warm and dry. No rash noted.  Nursing note and vitals reviewed.   ED Course  Procedures (including critical care time) Labs Review Labs Reviewed - No data to display  Imaging Review No results found.   MDM   1. Allergic sinusitis   2. Acute  nonintractable headache, unspecified headache type   3. PND (post-nasal drip)   4. Bronchospasm    Amoxicillin as directed Patient is monitor sitting taking any other medications for her symptoms in that she anticipates side effects. She declines to take any other medicine besides amoxicillin in that she is certain that she has a sinus infection. She is requesting antibiotics. She has an appointment with her PCP in 2 days and is advised not to miss that appointment. She has been advised that she has bronchospasm or wheezing, sinus congestion, PND. Patient refused to believe that this may have anything to do with allergies and must  be something else. Since she declines to take any other medicines, and cyst on taking antibiotics and she does have a low-grade fever go ahead and prescribed the amoxicillin and follow up with her PCP in 2 days.   [name] Napoleon, NP 12/28/14 1012

## 2014-12-30 ENCOUNTER — Other Ambulatory Visit: Payer: No Typology Code available for payment source

## 2014-12-30 ENCOUNTER — Other Ambulatory Visit: Payer: Self-pay | Admitting: Family Medicine

## 2014-12-30 DIAGNOSIS — E8881 Metabolic syndrome: Secondary | ICD-10-CM

## 2014-12-30 DIAGNOSIS — E282 Polycystic ovarian syndrome: Secondary | ICD-10-CM

## 2014-12-30 DIAGNOSIS — E039 Hypothyroidism, unspecified: Secondary | ICD-10-CM

## 2014-12-30 LAB — COMPLETE METABOLIC PANEL WITH GFR
ALT: 27 U/L (ref 0–35)
AST: 23 U/L (ref 0–37)
Albumin: 4.1 g/dL (ref 3.5–5.2)
Alkaline Phosphatase: 46 U/L (ref 39–117)
BUN: 12 mg/dL (ref 6–23)
CALCIUM: 8.7 mg/dL (ref 8.4–10.5)
CHLORIDE: 104 meq/L (ref 96–112)
CO2: 23 mEq/L (ref 19–32)
Creat: 0.92 mg/dL (ref 0.50–1.10)
GFR, Est African American: 87 mL/min
GFR, Est Non African American: 75 mL/min
Glucose, Bld: 112 mg/dL — ABNORMAL HIGH (ref 70–99)
Potassium: 4 mEq/L (ref 3.5–5.3)
Sodium: 136 mEq/L (ref 135–145)
Total Bilirubin: 0.2 mg/dL (ref 0.2–1.2)
Total Protein: 6.7 g/dL (ref 6.0–8.3)

## 2014-12-30 LAB — LIPID PANEL
Cholesterol: 161 mg/dL (ref 0–200)
HDL: 30 mg/dL — ABNORMAL LOW (ref >=46)
LDL CALC: 105 mg/dL — AB (ref 0–99)
Total CHOL/HDL Ratio: 5.4 Ratio
Triglycerides: 128 mg/dL (ref <150)
VLDL: 26 mg/dL (ref 0–40)

## 2014-12-30 LAB — HEMOGLOBIN A1C
HEMOGLOBIN A1C: 6.1 % — AB (ref <5.7)
MEAN PLASMA GLUCOSE: 128 mg/dL — AB (ref <117)

## 2014-12-30 LAB — TSH: TSH: 2.402 u[IU]/mL (ref 0.350–4.500)

## 2015-01-06 ENCOUNTER — Ambulatory Visit (INDEPENDENT_AMBULATORY_CARE_PROVIDER_SITE_OTHER): Payer: No Typology Code available for payment source | Admitting: Internal Medicine

## 2015-01-06 VITALS — BP 124/88 | HR 62 | Temp 98.1°F | Resp 16 | Ht 66.0 in | Wt 298.0 lb

## 2015-01-06 DIAGNOSIS — Z9189 Other specified personal risk factors, not elsewhere classified: Secondary | ICD-10-CM

## 2015-01-06 DIAGNOSIS — E282 Polycystic ovarian syndrome: Secondary | ICD-10-CM

## 2015-01-06 DIAGNOSIS — R269 Unspecified abnormalities of gait and mobility: Secondary | ICD-10-CM

## 2015-01-06 DIAGNOSIS — Z87898 Personal history of other specified conditions: Secondary | ICD-10-CM

## 2015-01-06 DIAGNOSIS — E119 Type 2 diabetes mellitus without complications: Secondary | ICD-10-CM

## 2015-01-06 DIAGNOSIS — M25562 Pain in left knee: Secondary | ICD-10-CM

## 2015-01-06 MED ORDER — METFORMIN HCL ER (MOD) 1000 MG PO TB24: 1000.0000 mg | ORAL_TABLET | Freq: Every day | ORAL | Status: DC

## 2015-01-06 NOTE — Progress Notes (Signed)
Patient ID: Desiree Schultz, female   DOB: 03/12/1971, 44 y.o.   MRN: [id number]   Desiree Schultz, is a 44 y.o. female  YQM:[id number]  [id number]  DOB - Jul 16, 1971  CC:  Chief Complaint  Patient presents with  . Knee Pain  . Follow-up       HPI: Desiree Schultz is a 44 y.o. female here today to follow up on the pain in her left knee. Pt states that she has had minimal relief and that her ability to walk or get in and out of the car is severley impeded. Pt cannot recall any trauma to the knee but noted that the pain 1st became noticeable when she was riding a stationary bike during exercise. She reports that the pain has progressed to the point where she is unable to use the knee without pain,. She denies pain during the night and now has pai with ambulation and with particular movements of the knee. She has not noticed any particular swelling and localizes the pain mostly to the posterior and "inside of" the knee joint.   Patient has No headache, No chest pain, No abdominal pain - No Nausea, No new weakness tingling or numbness, No Cough - SOB.  Allergies  Allergen Reactions  . Benadryl [Diphenhydramine Hcl]     itching  . Fish Oil     Makes hands raw, mouth and female area   . Influenza Vaccines     Gives pt the flu and site of injection swells and hot  . Latex     Burns skin  . Sulfur     Bladder spasms, mouth raw  . Tape     Burns skin, band-aid, ekg stickers   Past Medical History  Diagnosis Date  . Atrial fibrillation     2006  . Menorrhagia     2011  . Hypothyroid   . Prediabetes   . Depression   . PCOS (polycystic ovarian syndrome)   . OSA (obstructive sleep apnea)     on CPAP  . Obesity (BMI 30-39.9)   . Chronic anxiety   . Vitamin D deficiency   . Allergic rhinitis   . Hx of cardiovascular stress test     a. Myoview 10/13:  no ischemia; EF 57%  . Elevated blood pressure     On therapy. Probably hypertension   Current Outpatient Prescriptions on File  Prior to Visit  Medication Sig Dispense Refill  . amoxicillin (AMOXIL) 500 MG capsule Take 2 capsules (1,000 mg total) by mouth 2 (two) times daily. 40 capsule 0  . apixaban (ELIQUIS) 5 MG TABS tablet Take 1 tablet (5 mg total) by mouth 2 (two) times daily. 60 tablet 5  . Ascorbic Acid (VITAMIN C) 1000 MG tablet Take 1,000 mg by mouth daily.    . cetirizine (ZYRTEC) 10 MG tablet Take 10 mg by mouth daily.      . cholecalciferol (VITAMIN D) 400 UNITS TABS tablet Take 400 Units by mouth daily.    . citalopram (CELEXA) 40 MG tablet Take 1.5 tablets (60 mg total) by mouth daily. 1 and 1/2 tabs once daily (=60mg ) 60 tablet 2  . levothyroxine (SYNTHROID) 137 MCG tablet Take 1 tablet (137 mcg total) by mouth daily before breakfast. 90 tablet 3  . metoprolol succinate (TOPROL-XL) 25 MG 24 hr tablet Take 1 tablet (25 mg total) by mouth daily. 30 tablet 5  . Multiple Vitamin (MULTIVITAMIN) capsule Take 1 capsule by mouth daily.    . propafenone (  RYTHMOL) 225 MG tablet TAKE 1 TABLET TWICE A DAY (NEW MEDICATION) 60 tablet 5  . spironolactone (ALDACTONE) 50 MG tablet Take 1 tablet (50 mg total) by mouth daily. (Patient taking differently: Take 25 mg by mouth daily. ) 30 tablet 3  . VITAMIN K PO Take 1 tablet by mouth daily.    . [DISCONTINUED] ipratropium (ATROVENT) 0.06 % nasal spray Place 2 sprays into both nostrils 4 (four) times daily. (Patient not taking: Reported on 12/09/2014) 15 mL 0  . [DISCONTINUED] progesterone (PROMETRIUM) 200 MG capsule Take 1 capsule (200 mg total) by mouth daily. (Patient not taking: Reported on 12/09/2014) 14 capsule 3   No current facility-administered medications on file prior to visit.   Family History  Problem Relation Age of Onset  . Cardiomyopathy Father   . Heart disease Father   . Hyperlipidemia Father   . Hypertension Father   . Hypertension      fathers side  . Hyperlipidemia      fathers side  . Heart failure      both grandparents - paternal  . Coronary  artery disease Maternal Grandmother   . Heart attack Maternal Grandmother     and other maternal family members  . Heart attack Maternal Grandfather    History   Social History  . Marital Status: Single    Spouse Name: N/A  . Number of Children: N/A  . 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 Smoking status: Former Smoker -- 2.00 packs/day for 16 years    Types: Cigarettes    Quit date: 09/03/1998  . Smokeless tobacco: Not on file  . Alcohol Use: Yes     Comment: maybe 2 a mth  . Drug Use: Yes    Special: Marijuana     Comment: in high school -- none since  . Sexual Activity: Not on file   Other Topics Concern  . Not on file   Social History Narrative   Pt lives in [place] with friend.  Works as a Designer, jewellery    Review of Systems: Constitutional: Negative for fever, chills, diaphoresis, activity change, appetite change and fatigue. HENT: Negative for ear pain, nosebleeds, congestion, facial swelling, rhinorrhea, neck pain, neck stiffness and ear discharge.  Eyes: Negative for pain, discharge, redness, itching and visual disturbance. Respiratory: Negative for cough, choking, chest tightness, shortness of breath, wheezing and stridor.  Cardiovascular: Negative for chest pain, palpitations and leg swelling. Gastrointestinal: Negative for abdominal distention. Genitourinary: Negative for dysuria, urgency, frequency, hematuria, flank pain, decreased urine volume, difficulty urinating and dyspareunia.  Neurological: Negative for dizziness, tremors, seizures, syncope, facial asymmetry, speech difficulty, weakness, light-headedness, numbness and headaches.  Hematological: Negative for adenopathy. Does not bruise/bleed easily. Psychiatric/Behavioral: Negative for hallucinations, behavioral problems, confusion, dysphoric mood, decreased concentration and agitation.     Objective:   Filed Vitals:   01/06/15 1001   BP: 124/88  Pulse: 62  Temp: 98.1 F (36.7 C)  Resp: 16    Physical Exam: Constitutional: Patient appears well-developed and well-nourished. No distress. HENT: Normocephalic, atraumatic, External right and left ear normal. Oropharynx is clear and moist.  Eyes: Conjunctivae and EOM are normal. PERRLA, no scleral icterus. Neck: Normal ROM. Neck supple. No JVD. No tracheal deviation. No thyromegaly. CVS: RRR, S1/S2 +, no murmurs, no gallops, no carotid bruit.  Pulmonary: Effort and breath sounds normal, no stridor, rhonchi, wheezes, rales.  Abdominal: Soft. BS +, no  distension, tenderness, rebound or guarding.  Musculoskeletal: Pt has no edema or effusion noted over the anterior or lateral portions of the left knee. She does have some fullness of soft tissue in the popliteal space but this is symmetric to the right knee. She has normal AROM and PROM but there is a painful arc between  0-85 degrees of extension and flexion with AROM. She has normal strength in the painless arc but strength is limited by pain in the painful arc. Her anterior and posterior drawer tests are negative ans is the MCL, LCL and Apply's test. She does have a positive McMurray's test.  Lymphadenopathy: No lymphadenopathy noted, cervical, inguinal or axillary Neuro: Alert. Normal reflexes, muscle tone coordination. No cranial nerve deficit. Skin: Skin is warm and dry. No rash noted. Not diaphoretic. No erythema. No pallor. Psychiatric: Normal mood and affect. Behavior, judgment, thought content normal.   Lab Results  Component Value Date   WBC 9.2 10/05/2014   HGB 14.3 10/05/2014   HCT 42.8 10/05/2014   MCV 91.8 10/05/2014   PLT 327 10/05/2014   Lab Results  Component Value Date   CREATININE 0.92 12/30/2014   BUN 12 12/30/2014   NA 136 12/30/2014   K 4.0 12/30/2014   CL 104 12/30/2014   CO2 23 12/30/2014    Lab Results  Component Value Date   HGBA1C 6.1* 12/30/2014   Lipid Panel     Component Value  Date/Time   CHOL 161 12/30/2014 1645   TRIG 128 12/30/2014 1645   HDL 30* 12/30/2014 1645   CHOLHDL 5.4 12/30/2014 1645   VLDL 26 12/30/2014 1645   LDLCALC 105* 12/30/2014 1645       Assessment and plan:   1. Knee pain, left/Abnormality of gait - Examination is consisitent with an intraarticular process. Will obtain an MRI and  Also refer to Sports Medicine as she has limited access to Orthopedic Surgery and may benefit from a brace to support and protect further damage to the knee joint as well as Physical Therapy for strengthening and modalities to treat pain. I do not think that she has an inflammatory process that would benefit from NSAIDS at this time,. - MR Knee Left W Contrast; Future - Vit D  25 hydroxy (rtn osteoporosis monitoring)   2. Diabetes mellitus type 2, controlled, without complications - Hb I6E is elevated even with Metformin. So will increase to 1000 mg . - metFORMIN (GLUMETZA) 1000 MG (MOD) 24 hr tablet; Take 1 tablet (1,000 mg total) by mouth daily.  Dispense: 30 tablet; Refill: 6 - Microalbumin, urine  3. Polycystic disease, ovaries - Continue Metformin.   4.  At risk for osteoporosis - Vit D  25 hydroxy (rtn osteoporosis monitoring)   Return for Obesity, DM II, Vitamin D Deficiency.  The patient was given clear instructions to go to ER or return to medical center if symptoms don't improve, worsen or new problems develop. The patient verbalized understanding. The patient was told to call to get lab results if they haven't heard anything in the next week.     This note has been created with Surveyor, quantity. Any transcriptional errors are unintentional.    MATTHEWS,MICHELLE A., MD [place], [place]   01/06/2015, 5:34 PM

## 2015-01-07 LAB — MICROALBUMIN, URINE: MICROALB UR: 0.4 mg/dL (ref <2.0)

## 2015-01-07 LAB — VITAMIN D 25 HYDROXY (VIT D DEFICIENCY, FRACTURES): VIT D 25 HYDROXY: 45 ng/mL (ref 30–100)

## 2015-01-10 ENCOUNTER — Encounter: Payer: Self-pay | Admitting: Internal Medicine

## 2015-01-18 ENCOUNTER — Ambulatory Visit (INDEPENDENT_AMBULATORY_CARE_PROVIDER_SITE_OTHER): Payer: No Typology Code available for payment source | Admitting: Sports Medicine

## 2015-01-18 ENCOUNTER — Telehealth: Payer: Self-pay | Admitting: *Deleted

## 2015-01-18 ENCOUNTER — Encounter: Payer: Self-pay | Admitting: Sports Medicine

## 2015-01-18 VITALS — BP 110/62 | Ht 66.0 in | Wt 297.0 lb

## 2015-01-18 DIAGNOSIS — M25562 Pain in left knee: Secondary | ICD-10-CM

## 2015-01-18 NOTE — Telephone Encounter (Signed)
Follow Up       Pharmacist calling to speak with Dr. Olin Pia nurse in regards to pt's reaction that is previously documented. Please call back.

## 2015-01-18 NOTE — Assessment & Plan Note (Addendum)
Persistent 64months of left knee pain, swelling, and catching. [place] fairly unremarkable today. -Given the history and exam, concern would be for a meniscal tear versus chondromalacia versus less likely anterior cruciate ligament or PCL injury. -At this point, recommendation would be to proceed with an MRI of the left knee to evaluate for internal derangement. -She'll be arranged for this study and we will call her with results to make a further treatment plan, which may include injection versus surgical referral. -She may continue to take extra strength Tylenol as needed. -Follow-up pending MRI results.

## 2015-01-18 NOTE — Telephone Encounter (Signed)
Pharmacist from the [facility] called and stated that there is an interaction between the propafenone and the citalopram. She tells me that she spoke with the patient, and the patient indicated that she needs to remain on the citalopram. Please advise. Thanks, MI

## 2015-01-18 NOTE — Progress Notes (Addendum)
   Subjective:    Patient ID: Desiree Schultz, female    DOB: 1971/05/25, 44 y.o.   MRN: [id number]  HPI Desiree Schultz is a 44 year old female who presents with left knee pain. Onset was 3 months ago without any known acute injury. The only thing she can recall is that she was squatting down in exercise class and felt suddenly increasing left knee pain. Location of pain is primarily posterior knee, but also radiates to the anterior knee as well. She denies any significant low back pain or radicular symptoms. She denies any radiation of the pain including numbness, tingling, or weakness. She notes some mild diffuse anterior knee swelling as well. She notes associated locking and catching with sharp intermittent pain. Her pain occasionally wakes her up at night. Symptoms are aggravated with going up and down stairs or with bending the knee. She tried ice and over-the-counter analgesics. She has a history of atrial fibrillation and anticoagulation which prevents her from taking anti-inflammatories. She has not had any cortisone injections. She saw Dr. Rodman Key who ordered an MRI on 01/06/15, but this was not completed. Her symptoms have persisted for more than 3 months with mechanical catching and swelling, and her pain limits her function. Knee x-rays performed at her chiropractor's office were reportedly overall unremarkable except for some medial joint space narrowing bilaterally, though not bone-on-bone. We do not have these available for review today.  Past medical history, social history, medications, and allergies were reviewed and are up to date in the chart.  Review of Systems 7 point review of systems was performed and was otherwise negative unless noted in the history of present illness.     Objective:   Physical Exam BP 110/62 mmHg  Ht 5\' 6"  (1.676 m)  Wt 297 lb (134.718 kg)  BMI 47.96 kg/m2  LMP 12/22/2014 GEN: The patient is well-developed well-nourished female and in no acute distress.  She  is awake alert and oriented x3. SKIN: warm and well-perfused, no rash  EXTR: No lower extremity edema or calf tenderness Neuro: Strength 5/5 globally. Sensation intact throughout. DTRs 2/4 bilaterally. No focal deficits. Vasc: +2 bilateral distal pulses. No edema.  MSK: Examination of the left knee reveals range of motion from 0-115 with pain at the endpoint of flexion. Pain located at the posterior medial corner. No instability or reproduced pain with valgus or varus stress testing. Negative Lachman's. Negative posterior drawer. Trace effusion present and swelling in the infrapatellar region. Negative patellar grind test.  Limited musculoskeletal ultrasound long and short axis views were obtained of the left knee which did not show an effusion. Patellar and quadriceps tendons grossly intact. From what can be assessed of the medial and lateral menisci, these do appear normal. There are small spurs seen at the medial lateral portions of the trochlear groove. Examination of the posterior knee reveals no Baker's cyst.    Assessment & Plan:  Please see problem based assessment and plan in the problem list.

## 2015-01-19 ENCOUNTER — Ambulatory Visit
Admission: RE | Admit: 2015-01-19 | Discharge: 2015-01-19 | Disposition: A | Discharge status: Home / Self Care | Payer: No Typology Code available for payment source | Source: Ambulatory Visit | Attending: Sports Medicine | Admitting: Sports Medicine

## 2015-01-19 DIAGNOSIS — M25562 Pain in left knee: Secondary | ICD-10-CM

## 2015-01-19 NOTE — Telephone Encounter (Signed)
Advised Dr. Caryl Comes is aware she is on both medications and that she has been taking these for greater than 6 months.

## 2015-01-20 ENCOUNTER — Telehealth: Payer: Self-pay | Admitting: Sports Medicine

## 2015-01-20 NOTE — Telephone Encounter (Signed)
Discussed MRI results with patient. MRI: Medial Meniscal Tear posterior medial meniscus near root. Chondromalacia patella. 12mm medial ganglion cyst. Options discussed. Patient wishes to first try a cortisone injection prior to consideration of arthroscopic debridement. Melanie informed. Patient might come in tomorrow AM to see one of our colleagues for knee injection. Plan follow-up a few weeks after injection to see how she is doing. Not currently working- attending school.

## 2015-01-21 ENCOUNTER — Ambulatory Visit (INDEPENDENT_AMBULATORY_CARE_PROVIDER_SITE_OTHER): Payer: No Typology Code available for payment source | Admitting: Family Medicine

## 2015-01-21 ENCOUNTER — Encounter: Payer: Self-pay | Admitting: Family Medicine

## 2015-01-21 VITALS — BP 119/66 | Ht 66.0 in | Wt 297.0 lb

## 2015-01-21 DIAGNOSIS — E669 Obesity, unspecified: Secondary | ICD-10-CM

## 2015-01-21 DIAGNOSIS — M25562 Pain in left knee: Secondary | ICD-10-CM

## 2015-01-21 MED ORDER — METHYLPREDNISOLONE ACETATE 40 MG/ML IJ SUSP
40.0000 mg | Freq: Once | INTRAMUSCULAR | Status: AC
  Administered 2015-01-21: 40 mg via INTRA_ARTICULAR

## 2015-01-21 NOTE — Progress Notes (Signed)
   Subjective:    Patient ID: Desiree Schultz, female    DOB: June 18, 1971, 44 y.o.   MRN: [id number]  HPI She is here for discussion and consideration of the left knee corticosteroid injection. 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 She has not yet seen her films her gotten the report. Her knee pain continues unchanged.  Review of Systems  She has no new symptoms. No fever, sweats, chills, unusual weight change. See history of present illness.     Objective:   Physical Exam Vital signs are reviewed GEN.: Well-developed overweight female no acute distress KNEES: Bilaterally symmetrical. She has full range of motion flexion extension of the left knee. There is no knee effusion, no warmth, no erythema of the left knee. Mild tenderness to palpation medial and lateral joint line. On the lateral joint line she's particularly tender over the posterior portion. Popliteal space is benign. Calf is soft.  IMAGING: MRI report.Medial meniscus: A radial tear is seen along the free edge of the posterior horn of the medial meniscus just peripheral to the meniscal root. Degenerative signal is seen in the meniscal root but it appears intact. Lateral meniscus: Intact   MY INTERPRETATION: I reviewed the films with her. In general she has significant joint space also and her menisci have a lot of degenerative change as well as the above-mentioned tear of the posterior horn of the medial meniscus. She also has some bone marrow changes seen in the femur and the tibia. Him about this. He said they are likely related to obesity. In his note he had specifically mention these were not worrisome bone marrow changes.  INJECTION: Patient was given informed consent, signed copy in the chart. Appropriate time out was taken. Area prepped and draped in usual sterile fashion. 1 cc of methylprednisolone 40 mg/ml plus  4 cc of 1% lidocaine without epinephrine  was injected into the left knee using a(n) anterior medial approach. The patient tolerated the procedure well. There were no complications. Post procedure instructions were given.      Assessment & Plan:

## 2015-01-21 NOTE — Assessment & Plan Note (Signed)
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 We discussed these issues at length spinning around 50% of our 35 minute office visit calcium education. Specifically we talked about some injection therapy her chiropractor was recommending to her. He had told this would probably help her knee for about a year was not a steroid injection. I talked about long-term she's likely to have to have earlier knee replacement secondary to the wear and tear on her knees. Ultimately she decided to proceed with Corticosteroid injection today.

## 2015-01-21 NOTE — Assessment & Plan Note (Signed)
We also discussed today how her obesity impacts her chronic knee pain. Likely going to cause her to have to have early joint replacement. She's considering gastric sleeve surgical intervention for her obesity. Sounds like she struggled with this for a long time. We talked about exercise and specifically I recommended no running, no stair stepper. The 5 to use the elliptical machine. It would be fine to use stationary or other bike. She has been successful in biking in the past. Right now she is in school and is not having a lot of time to exercise which is impacting her weight.

## 2015-01-26 ENCOUNTER — Other Ambulatory Visit: Payer: Self-pay | Admitting: Internal Medicine

## 2015-01-26 MED ORDER — APIXABAN 5 MG PO TABS: 5.0000 mg | ORAL_TABLET | Freq: Two times a day (BID) | ORAL | Status: DC

## 2015-02-08 ENCOUNTER — Telehealth: Payer: Self-pay | Admitting: Obstetrics and Gynecology

## 2015-02-09 NOTE — Telephone Encounter (Signed)
Patient is asking for a rx for True Metric Glucometer, Test strips, and Lancets. LOV 01/06/2015. Please advise. Thanks!

## 2015-02-10 ENCOUNTER — Other Ambulatory Visit: Payer: Self-pay | Admitting: Internal Medicine

## 2015-02-10 DIAGNOSIS — E119 Type 2 diabetes mellitus without complications: Secondary | ICD-10-CM

## 2015-02-10 DIAGNOSIS — R7303 Prediabetes: Secondary | ICD-10-CM | POA: Insufficient documentation

## 2015-02-10 NOTE — Telephone Encounter (Signed)
Left message advising patient of no need to check blood sugar and she can purchase a meter over the counter if she likes. Thanks!

## 2015-02-10 NOTE — Telephone Encounter (Signed)
Pt is currently not on insulin and Hb A1c is in pre-diabetic range so no indication for check in g BS regularly from a medical perspective. Furthermore she has no payer that requires a prescription to cover a meter and strips. However she can always purchase a meter, lancets and strips from any Pharmacy without a prescription.

## 2015-02-18 ENCOUNTER — Encounter: Payer: Self-pay | Admitting: Family Medicine

## 2015-02-18 ENCOUNTER — Ambulatory Visit (INDEPENDENT_AMBULATORY_CARE_PROVIDER_SITE_OTHER): Payer: No Typology Code available for payment source | Admitting: Family Medicine

## 2015-02-18 VITALS — BP 114/76 | HR 60 | Ht 66.0 in | Wt 297.0 lb

## 2015-02-18 DIAGNOSIS — E669 Obesity, unspecified: Secondary | ICD-10-CM

## 2015-02-18 DIAGNOSIS — M25562 Pain in left knee: Secondary | ICD-10-CM

## 2015-02-18 MED ORDER — METHYLPREDNISOLONE ACETATE 40 MG/ML IJ SUSP
40.0000 mg | Freq: Once | INTRAMUSCULAR | Status: AC
  Administered 2015-02-18: 40 mg via INTRA_ARTICULAR

## 2015-02-19 ENCOUNTER — Encounter: Payer: Self-pay | Admitting: Family Medicine

## 2015-02-19 NOTE — Progress Notes (Signed)
   Subjective:    Patient ID: Desiree Schultz, female    DOB: [birth year], 44 y.o.   MRN: [id number]  HPI Worsening of LEFT knee pain, acutely while riding her bike. Felt a pop and since then has had a lot of difficulty straightening her knee fully and much more pain with bearing weight.    Review of Systems Worsening knee pain w mechainical symptoms which are new     Objective:   Physical Exam Vitals reviewed. BMI > 47.  KNEE LEFT: cannot extend past 60 degrees without signifianat pain and I cannot move it passively either secondary to increased pain. Has flexion to 100 degrees with some pain. No effusion. TTP posterior medial joint line. No significant effusion, No  Erythema or warmth. Calf is soft. Distally NV intact.  INJECTION: Patient was given informed consent, signed copy in the chart. Appropriate time out was taken. Area prepped and draped in usual sterile fashion. 1 cc of methylprednisolone 40 mg/ml plus  4 cc of 1% lidocaine without epinephrine was injected into the LEFT knee  using a(n) anterior medial  approach. The patient tolerated the procedure well initially although she said it was a much more painful injection hat the first one I gave her. She became a little light headed afterward so we cave her a cup of water, let her lie down with legs elevated and her symptoms resoled within a few minutes. She was able to walk out without assistance and was asymptomatic except for knee pain that was present when she arrived (although very slightly better after injection likely secondary to lidoacine). Her gait remained antalgic. There were no other or significant complications. Post procedure instructions were given.         Assessment & Plan:

## 2015-02-19 NOTE — Assessment & Plan Note (Signed)
Suspect meniscus has a flap causing mechanical issues. Likely would benefit arthroscopy. We are trying to arrange some type ortho consult (dfficult since no insurance) --appt made [place] next week.  We did do a second CSI today because she is in significant pain and I have no other options for her (does no twant any type pain medicine besides OTC) She is addressing her obesity by return to activity (biked 80 miles last week) and this knee is really inhibiting any p[rgress---she is frustrated. Also plans to seek consul for gastric bypass or some other type surgery to help her address her obesity. She is motivated to address her issues and I hope we can help her out in some way.

## 2015-02-19 NOTE — Assessment & Plan Note (Deleted)
LEFT: . Medial mensical tear CSI #1 01/2015 and 32 02/2015. Ortho referral (no insurance) but really needs scope secondary to mechanical sx 02/2015. MRI 01/2015.  A radial tear is seen along the free edge of the posterior horn of the medial meniscus just peripheral to the meniscal root. Degenerative signal is seen in the meniscal root but it appears intact. Lateral meniscus: Intact  Dr Nori Riis additional comments: : I reviewed the films with her. In general she has significant joint space also and her menisci have a lot of degenerative change as well as the above-mentioned tear of the posterior horn of the medial meniscus. She also has some bone marrow changes seen in the femur and the tibia. I discussed with reading radiologist personally and he thinks these changes in bone marrow are  are likely related to obesity. In his note he had specifically mentioned these were not worrisome bone marrow changes.

## 2015-03-02 ENCOUNTER — Encounter: Payer: Self-pay | Admitting: Internal Medicine

## 2015-03-02 ENCOUNTER — Ambulatory Visit (INDEPENDENT_AMBULATORY_CARE_PROVIDER_SITE_OTHER): Payer: No Typology Code available for payment source | Admitting: Internal Medicine

## 2015-03-02 VITALS — BP 118/80 | HR 58 | Ht 65.0 in | Wt 305.2 lb

## 2015-03-02 DIAGNOSIS — I480 Paroxysmal atrial fibrillation: Secondary | ICD-10-CM

## 2015-03-02 MED ORDER — ATENOLOL 25 MG PO TABS
25.0000 mg | ORAL_TABLET | Freq: Every day | ORAL | Status: DC
Start: 2015-03-02 — End: 2015-08-22

## 2015-03-02 NOTE — Patient Instructions (Addendum)
Medication Instructions:  Your physician has recommended you make the following change in your medication:  1) STOP Propafenone 2) START Dronedarone 400 mg twice daily -- **Do not start this medication until we call you.  Dr. Caryl Comes will speak with pharmacy first because it may interact with your Celexa** 3) STOP Metoprolol Succinate 4) START Atenolol 25 mg daily  Labwork: None ordered  Testing/Procedures: None ordered  Follow-Up: Your physician wants you to follow-up in: 6 months with Dr. Caryl Comes. You will receive a reminder letter in the mail two months in advance. If you don't receive a letter, please call our office to schedule the follow-up appointment.   Thank you for choosing [facility]!!

## 2015-03-02 NOTE — Progress Notes (Signed)
Patient Care Team: Dian Queen, MD as PCP - General (Obstetrics and Gynecology) Lorne Skeens, MD as Attending Physician (Endocrinology) Thompson Grayer, MD as Attending Physician (Cardiology)   HPI  Desiree Schultz is a 44 y.o. female Seen in followup for atrial fibrillation occurring in the context of polycystic ovarian syndrome. She was started on apixaban. It was her impression that atrial fibrillation tracked with  hre period.  She had been treated with flecainide which we decreased from 100--50 mg twice daily for fatigue; this was done at her last visit. She she ended up on propafenone but it also is associated with fatigue as is her metoprolol. She's been having more palpitations and has tried to take more medication but this is been problematic.  She is also concerned about her TSH and thyroid replacement and wondering whether another laboratory evaluation has been accomplished.   (see below)  She is also having atypical chest pains, and in the context of her flecainide we undertook CT scoring which was 0.    She also had echocardiographic evidence of LVE/L AE  Repeat echocardiogram demonstrated left ventricular enlargement with end-diastolic parameters of 14.4/31.5 (36.1/53) there is no wall thickening. Left atrial dimensions were borderline enlarged in one dimension and normal into dimension (46/1.78)  There is a questionable family history of cardiomyopathy  we have spoken in the past about genetic testing although I can't for the life of me figure out what prompted me to pursue this discussion with her based on what we know thus far   She takes aldactone for her PCOS and her blood pressures no well-controlled.   She anticipates knee surgery. She is also wondering about gastric sleeve surgery      Past Medical History  Diagnosis Date  . Atrial fibrillation     2006  . Menorrhagia     2011  . Hypothyroid   . Prediabetes   . Depression   . PCOS  (polycystic ovarian syndrome)   . OSA (obstructive sleep apnea)     on CPAP  . Obesity (BMI 30-39.9)   . Chronic anxiety   . Vitamin D deficiency   . Allergic rhinitis   . Hx of cardiovascular stress test     a. Myoview 10/13:  no ischemia; EF 57%  . Elevated blood pressure     On therapy. Probably hypertension    Past Surgical History  Procedure Laterality Date  . Endometrial ablation    . Dilation and curettage, diagnostic / therapeutic    . Tonsillectomy  2007  . Bladder surgery  1997    interstitial cystitis   . Hand surgery  2006    due to cat bite  . Renal biopsy, open  2006  . Endometrial ablation  08/2010  . Ovarian cyst removal  04/28/13    Current Outpatient Prescriptions  Medication Sig Dispense Refill  . apixaban (ELIQUIS) 5 MG TABS tablet Take 1 tablet (5 mg total) by mouth 2 (two) times daily. 180 tablet 2  . Ascorbic Acid (VITAMIN C) 1000 MG tablet Take 1,000 mg by mouth daily.    . cetirizine (ZYRTEC) 10 MG tablet Take 10 mg by mouth daily.      . cholecalciferol (VITAMIN D) 400 UNITS TABS tablet Take 400 Units by mouth daily.    . citalopram (CELEXA) 40 MG tablet Take 40 mg by mouth daily.    Marland Kitchen levothyroxine (SYNTHROID) 137 MCG tablet Take 1 tablet (137 mcg total) by mouth daily  before breakfast. 90 tablet 3  . metFORMIN (GLUMETZA) 1000 MG (MOD) 24 hr tablet Take 1 tablet (1,000 mg total) by mouth daily. 30 tablet 6  . metoprolol succinate (TOPROL-XL) 25 MG 24 hr tablet Take 1 tablet (25 mg total) by mouth daily. 30 tablet 5  . Multiple Vitamin (MULTIVITAMIN) capsule Take 1 capsule by mouth daily.    . propafenone (RYTHMOL) 225 MG tablet Take 225 mg by mouth 2 (two) times daily.    Marland Kitchen spironolactone (ALDACTONE) 50 MG tablet Take 25 mg by mouth daily.    Marland Kitchen VITAMIN K PO Take 1 tablet by mouth daily.    . [DISCONTINUED] ipratropium (ATROVENT) 0.06 % nasal spray Place 2 sprays into both nostrils 4 (four) times daily. (Patient not taking: Reported on 12/09/2014) 15 mL  0  . [DISCONTINUED] progesterone (PROMETRIUM) 200 MG capsule Take 1 capsule (200 mg total) by mouth daily. (Patient not taking: Reported on 12/09/2014) 14 capsule 3   No current facility-administered medications for this visit.    Allergies  Allergen Reactions  . Benadryl [Diphenhydramine Hcl]     itching  . Fish Oil     Makes hands raw, mouth and female area   . Influenza Vaccines     Gives pt the flu and site of injection swells and hot  . Latex     Burns skin  . Sulfur     Bladder spasms, mouth raw  . Tape     Burns skin, band-aid, ekg stickers    Review of Systems negative except from HPI and PMH  Physical Exam BP 118/80 mmHg  Pulse 58  Ht 5\' 5"  (1.651 m)  Wt 305 lb 3.2 oz (138.438 kg)  BMI 50.79 kg/m2 Well developed and well nourished in no acute distress HENT normal E scleral and icterus clear Neck Supple JVP flat; carotids brisk and full Clear to ausculation  Regular rate and rhythm, no murmurs gallops or rub Soft with active bowel sounds No clubbing cyanosis  Edema Alert and oriented, grossly normal motor and sensory function Skin Warm and Dry  ECG  SR 58 19/08/42 Otherwise normal  Assessment and  Plan  Paroxysmal atrial fibrillation  Sinus bradycardia  Left ventricular enlargement  Hypothyroidism   Polycystic ovarian syndrome  Hypertension  Obstructive sleep apnea    I called Dr. Forde Dandy to clarify thyroid therapy targets. A TSH of 1-2 is the appropriate testing targets. Her TSH is 2.4 and she will follow-up with her PCP about up titration of her thyroid replacement.  Given her fatigue with the propafenone and metoprolol, I've given her a prescription for atenolol 25 metoprolol tartrate 25 twice daily to see if she can take either of these with less fatigue. In addition, we will put her propafenone on hold and have given her samples for dronaderone to see if this won't be better tolerated.   We will have to look into the interaction between  Celexa  This may aggravate her bradycardia.  Blood pressure is well-controlled on Aldactone. Potassium levels were 4.02 months ago.

## 2015-03-04 ENCOUNTER — Ambulatory Visit (INDEPENDENT_AMBULATORY_CARE_PROVIDER_SITE_OTHER): Payer: No Typology Code available for payment source | Admitting: Family Medicine

## 2015-03-04 VITALS — BP 136/64 | HR 60 | Temp 99.0°F | Resp 16 | Wt 308.0 lb

## 2015-03-04 DIAGNOSIS — E039 Hypothyroidism, unspecified: Secondary | ICD-10-CM

## 2015-03-04 DIAGNOSIS — Z0000 Encounter for general adult medical examination without abnormal findings: Secondary | ICD-10-CM

## 2015-03-04 LAB — POCT URINALYSIS DIP (DEVICE)
BILIRUBIN URINE: NEGATIVE
Glucose, UA: NEGATIVE mg/dL
Ketones, ur: NEGATIVE mg/dL
Leukocytes, UA: NEGATIVE
Nitrite: NEGATIVE
Protein, ur: >=300 mg/dL — AB
SPECIFIC GRAVITY, URINE: 1.025 (ref 1.005–1.030)
Urobilinogen, UA: 0.2 mg/dL (ref 0.0–1.0)
pH: 7.5 (ref 5.0–8.0)

## 2015-03-04 MED ORDER — LEVOTHYROXINE SODIUM 175 MCG PO TABS: 175.0000 ug | ORAL_TABLET | Freq: Every day | ORAL | Status: DC

## 2015-03-04 NOTE — Progress Notes (Signed)
Subjective:     Patient ID: Desiree Schultz, female   DOB: May 29, 1971, 44 y.o.   MRN: [id number]  HPI   Patient presents today with concern about weight gain. She has hypothyroidism. She most recently has been on levothyroxine 137 mcg daily. Prior reports prior to that she was on Synthroid. Since she switched from Synthroid to levothyroxine (she thinks in December) she has been gaining weight. Her record indicates her weight is 308 today, up from 291 in December. Her TSH in April was 2.402, In February was 1.918, In 2013 was 1.222. I have no record of T3.T4.  She brings copies for labs performed elsewhere showing a TSH of 3.75, T4 6.5, T3 63   Review of Systems   Positive for weight gain and increased fatigue Negative for hair loss, coarse dry skin, constipation or other symptoms of hypothyroidism     Objective:   Physical Exam   Alert, oriented, appropriate, in no acute distress Skin is warm and dry Neck is supple FROM without adenopathy, tenderness or thyroidomegaly Lungs are clear to auscultation HS are regular w/o m,g,r BP 136/64. P 60.     Assessment:     Hypothyroidism     Plan:     Will increase levothyroxine from 137 mcg to 175 mcg. Will need a recheck in 2 months.    Micheline Chapman, FNP-BC     Has

## 2015-03-04 NOTE — Patient Instructions (Signed)
Increase levothyroxine to 175 mg. You did have some protein in your urine but your other kidney function test are fine. Follow-up in 2 months for recheck of TSH. T3,T4

## 2015-03-15 ENCOUNTER — Telehealth: Payer: Self-pay | Admitting: Internal Medicine

## 2015-03-15 NOTE — Telephone Encounter (Signed)
Informed [facility] that I would have Dr. Caryl Comes address this next week when he returns to the office. Apologized for the delay in this clearance request.  Also spoke with patient.  She is going to call me when she starts Lexapro (prob around 1st of August) and then we will discuss when to begin Multaq.

## 2015-03-15 NOTE — Telephone Encounter (Signed)
New message      Office calling to follow up on surgical clearance paperwork.  Paperwork was faxed to office on June 28th Please call to discuss status of clearance

## 2015-03-16 ENCOUNTER — Ambulatory Visit (INDEPENDENT_AMBULATORY_CARE_PROVIDER_SITE_OTHER): Payer: No Typology Code available for payment source | Admitting: Family Medicine

## 2015-03-16 VITALS — BP 132/80 | HR 67 | Temp 98.5°F | Resp 16 | Ht 65.0 in | Wt 297.0 lb

## 2015-03-16 DIAGNOSIS — J209 Acute bronchitis, unspecified: Secondary | ICD-10-CM

## 2015-03-16 MED ORDER — PREDNISONE 20 MG PO TABS: ORAL_TABLET | ORAL | Status: DC

## 2015-03-16 MED ORDER — ESCITALOPRAM OXALATE 20 MG PO TABS: 20.0000 mg | ORAL_TABLET | Freq: Every day | ORAL | Status: DC

## 2015-03-16 MED ORDER — AMOXICILLIN-POT CLAVULANATE 875-125 MG PO TABS: 1.0000 | ORAL_TABLET | Freq: Two times a day (BID) | ORAL | Status: DC

## 2015-03-16 MED ORDER — ALBUTEROL SULFATE HFA 108 (90 BASE) MCG/ACT IN AERS
2.0000 | INHALATION_SPRAY | Freq: Four times a day (QID) | RESPIRATORY_TRACT | Status: DC | PRN
Start: 2015-03-16 — End: 2016-03-21

## 2015-03-16 MED ORDER — IPRATROPIUM-ALBUTEROL 0.5-2.5 (3) MG/3ML IN SOLN
3.0000 mL | Freq: Once | RESPIRATORY_TRACT | Status: AC
  Administered 2015-03-16: 3 mL via RESPIRATORY_TRACT

## 2015-03-16 MED ORDER — HYDROCOD POLST-CPM POLST ER 10-8 MG/5ML PO SUER: 5.0000 mL | Freq: Two times a day (BID) | ORAL | Status: DC | PRN

## 2015-03-16 NOTE — Progress Notes (Signed)
Subjective:     Patient ID: Desiree Schultz, female   DOB: 09/08/1970, 44 y.o.   MRN: [id number]  HPI   Patient presents today with a 10-12 day history of cough. She denies ST, nasal congestion, sinus, congestion, fever, chills. She has not history of bronchitis or asthma.The cough is mostly dry but occasionally get mucus up.   Review of Systems   Negative for fever, chills Negative for ST, nasal congestion, earache, sinus congestion Postive for cough and SOB with coughing Postive for chest soreness but not chest pain suggestive of cardiac pain, no palpitations     Objective:   Physical Exam   Alert, oriented, appropriate, in very mild respirtory distress Skin warm and damp HENT: within normal limits Neck supple FROM w/o adenopathy or tenderness Lungs have coarse breath sounds and coarse wheezes in the lower lobes bilaterally. Pulse OX 93-95. Effort slightly increased, Heart Sounds are regular w/o m,g,r     Assessment Plan     Acute bronchitis - due to the low pulse ox am ordering a Duo-neb treatment here today -Augementin 857 #20, one po q day for 10 days -Prednisone 20 mg, 3 a day for 2 days, 2 a day for 2 days, then one a day for 2days. -Albuterol inhaler, #1 2 puffs prn -Tussionex cough syrup, one tsp q 12 hours prn. -Follow-up on Monday if not significantly improved.              Micheline Chapman, [facility]

## 2015-03-16 NOTE — Patient Instructions (Signed)
Take medications as prescribed on bottles. Drink lots of fluids Try to sleep with head elevated. Follow-up on Monday if no significantly improved ED for increasing respiratory distress.

## 2015-03-17 ENCOUNTER — Encounter: Payer: Self-pay | Admitting: Family Medicine

## 2015-03-23 ENCOUNTER — Telehealth: Payer: Self-pay | Admitting: *Deleted

## 2015-03-23 ENCOUNTER — Other Ambulatory Visit: Payer: Self-pay | Admitting: Family Medicine

## 2015-03-23 DIAGNOSIS — R059 Cough, unspecified: Secondary | ICD-10-CM

## 2015-03-23 DIAGNOSIS — R05 Cough: Secondary | ICD-10-CM

## 2015-03-23 NOTE — Telephone Encounter (Signed)
Desiree Schultz, Please see Don's messages below regarding patients request and advise. Thanks!

## 2015-03-23 NOTE — Telephone Encounter (Signed)
Pt called and was wondering when can she have her Thyroid levels checked. Pt is complaining of having no energy. Pt wants them checked if possible ASAP  Please advise MD

## 2015-03-23 NOTE — Telephone Encounter (Signed)
I will order a chest x-ray.

## 2015-03-23 NOTE — Telephone Encounter (Signed)
Pt is also states she completed her steroids and is still on the Antibiotics and is still using the inhaler. Pt still feels as though she is still having shortness of breath. Patient came in on 7/13 for cough and congestion. Please advise MD

## 2015-03-24 ENCOUNTER — Telehealth: Payer: Self-pay | Admitting: Internal Medicine

## 2015-03-24 NOTE — Telephone Encounter (Signed)
Follow Up    Pt is calling following up on clearance that was sent over last week from [facility]. Pt also has some questions regarding some medications she received. Please call.

## 2015-03-24 NOTE — Telephone Encounter (Signed)
Clearance form completed and left in medical records for faxing.

## 2015-03-24 NOTE — Telephone Encounter (Signed)
Follow Up ° ° ° °Returning call from earlier. Please call. °

## 2015-03-24 NOTE — Telephone Encounter (Signed)
Informed patient completed clearance form given to medical records this morning to fax.  She wanted to let me know that she will be switching to Lexapro on 8/1 and would like advisement on when to stop Propafenone and start Multaq explained that I would review with pharmacist and let her know next week. She is agreeable to this plan and verbalized understanding.

## 2015-03-28 ENCOUNTER — Telehealth: Payer: Self-pay | Admitting: Internal Medicine

## 2015-03-28 NOTE — Telephone Encounter (Signed)
New message     Office received surgical clearance but the issue with holding eliquis was not address. Please call to discuss

## 2015-03-28 NOTE — Telephone Encounter (Signed)
Will forward to Dr. Caryl Comes and Trinidad Curet, RN.  Sherry at [facility] notified note would be sent to Dr. Caryl Comes.

## 2015-03-29 NOTE — Telephone Encounter (Signed)
Clearance re-faxed - addressing Eliquis

## 2015-03-29 NOTE — Telephone Encounter (Signed)
Left detailed message apologizing for missing addressing Eliquis instructions on clearance request. Informed that it is being addressed today and will be re-faxed today. Advised to call back with further questions/concerns.

## 2015-03-30 ENCOUNTER — Other Ambulatory Visit (HOSPITAL_BASED_OUTPATIENT_CLINIC_OR_DEPARTMENT_OTHER): Payer: Self-pay | Admitting: Orthopaedic Surgery

## 2015-03-30 NOTE — Progress Notes (Signed)
Sherri at Dr Phoebe Sharps office notified of need to have surgery at main OR.

## 2015-03-30 NOTE — Progress Notes (Signed)
Chart reviewed by Dr Al Corpus, due to patients multiple comorbities, she she be better served being done at main OR.

## 2015-03-30 NOTE — Telephone Encounter (Signed)
Called patient who tells me she is currently studying for finals and stressing.  I told pt I would call her next week to review medication instructions. Patient thanks me and appreciates my understanding.

## 2015-04-05 ENCOUNTER — Encounter (HOSPITAL_COMMUNITY)
Admission: RE | Admit: 2015-04-05 | Discharge: 2015-04-05 | Disposition: A | Discharge status: Home / Self Care | Payer: Self-pay | Source: Ambulatory Visit | Attending: Orthopaedic Surgery | Admitting: Orthopaedic Surgery

## 2015-04-05 ENCOUNTER — Encounter (HOSPITAL_COMMUNITY): Payer: Self-pay

## 2015-04-05 DIAGNOSIS — Z01812 Encounter for preprocedural laboratory examination: Secondary | ICD-10-CM | POA: Insufficient documentation

## 2015-04-05 HISTORY — DX: Other specified postprocedural states: Z98.890

## 2015-04-05 HISTORY — DX: Cardiac arrhythmia, unspecified: I49.9

## 2015-04-05 HISTORY — DX: Nausea with vomiting, unspecified: R11.2

## 2015-04-05 HISTORY — DX: Anemia, unspecified: D64.9

## 2015-04-05 HISTORY — DX: Other abnormalities of breathing: R06.89

## 2015-04-05 LAB — CBC
HCT: 41.9 % (ref 36.0–46.0)
Hemoglobin: 14.5 g/dL (ref 12.0–15.0)
MCH: 31.7 pg (ref 26.0–34.0)
MCHC: 34.6 g/dL (ref 30.0–36.0)
MCV: 91.7 fL (ref 78.0–100.0)
Platelets: 309 10*3/uL (ref 150–400)
RBC: 4.57 MIL/uL (ref 3.87–5.11)
RDW: 12.9 % (ref 11.5–15.5)
WBC: 8.8 10*3/uL (ref 4.0–10.5)

## 2015-04-05 LAB — BASIC METABOLIC PANEL
ANION GAP: 6 (ref 5–15)
BUN: 11 mg/dL (ref 6–20)
CALCIUM: 8.3 mg/dL — AB (ref 8.9–10.3)
CO2: 25 mmol/L (ref 22–32)
Chloride: 107 mmol/L (ref 101–111)
Creatinine, Ser: 1.07 mg/dL — ABNORMAL HIGH (ref 0.44–1.00)
Glucose, Bld: 87 mg/dL (ref 65–99)
Potassium: 4 mmol/L (ref 3.5–5.1)
SODIUM: 138 mmol/L (ref 135–145)

## 2015-04-05 LAB — GLUCOSE, CAPILLARY: GLUCOSE-CAPILLARY: 131 mg/dL — AB (ref 65–99)

## 2015-04-05 LAB — HCG, SERUM, QUALITATIVE: Preg, Serum: NEGATIVE

## 2015-04-05 MED ORDER — DEXTROSE 5 % IV SOLN
3.0000 g | INTRAVENOUS | Status: AC
  Administered 2015-04-06: 3 g via INTRAVENOUS
  Filled 2015-04-05: qty 3000

## 2015-04-05 NOTE — Pre-Procedure Instructions (Signed)
    Desiree Schultz  04/05/2015      [place], [place] [address] Phone: [phone] Fax: [phone]  [id number] - [place], [place] [address] Phone: [phone] Fax: [phone]    Your procedure is scheduled on 04/06/15.  Report to [facility] Admitting at 10 A.M.  Call this number if you have problems the morning of surgery:  [phone]   Remember:  Do not eat food or drink liquids after midnight.  Take these medicines the morning of surgery with A SIP OF WATER --atenolol,zyrtec,lexapro,synthroid,metoprolol   Do not wear jewelry, make-up or nail polish.  Do not wear lotions, powders, or perfumes.  You may wear deodorant.  Do not shave 48 hours prior to surgery.  Men may shave face and neck.  Do not bring valuables to the hospital.  [facility] is not responsible for any belongings or valuables.  Contacts, dentures or bridgework may not be worn into surgery.  Leave your suitcase in the car.  After surgery it may be brought to your room.  For patients admitted to the hospital, discharge time will be determined by your treatment team.  Patients discharged the day of surgery will not be allowed to drive home.   Name and phone number of your driver:    Special instructions:    Please read over the following fact sheets that you were given. Pain Booklet, Coughing and Deep Breathing and Surgical Site Infection Prevention

## 2015-04-06 ENCOUNTER — Ambulatory Visit (HOSPITAL_COMMUNITY): Payer: Self-pay | Admitting: Registered Nurse

## 2015-04-06 ENCOUNTER — Encounter (HOSPITAL_COMMUNITY): Payer: Self-pay | Admitting: *Deleted

## 2015-04-06 ENCOUNTER — Encounter (HOSPITAL_COMMUNITY)
Admission: RE | Disposition: A | Discharge status: Home / Self Care | Payer: Self-pay | Source: Ambulatory Visit | Attending: Orthopaedic Surgery

## 2015-04-06 ENCOUNTER — Ambulatory Visit (HOSPITAL_COMMUNITY)
Admission: RE | Admit: 2015-04-06 | Discharge: 2015-04-06 | Disposition: A | Discharge status: Home / Self Care | Payer: Self-pay | Source: Ambulatory Visit | Attending: Orthopaedic Surgery | Admitting: Orthopaedic Surgery

## 2015-04-06 DIAGNOSIS — E119 Type 2 diabetes mellitus without complications: Secondary | ICD-10-CM | POA: Insufficient documentation

## 2015-04-06 DIAGNOSIS — X58XXXA Exposure to other specified factors, initial encounter: Secondary | ICD-10-CM | POA: Insufficient documentation

## 2015-04-06 DIAGNOSIS — G4733 Obstructive sleep apnea (adult) (pediatric): Secondary | ICD-10-CM | POA: Insufficient documentation

## 2015-04-06 DIAGNOSIS — M65862 Other synovitis and tenosynovitis, left lower leg: Secondary | ICD-10-CM | POA: Insufficient documentation

## 2015-04-06 DIAGNOSIS — S83242A Other tear of medial meniscus, current injury, left knee, initial encounter: Secondary | ICD-10-CM | POA: Insufficient documentation

## 2015-04-06 DIAGNOSIS — E039 Hypothyroidism, unspecified: Secondary | ICD-10-CM | POA: Insufficient documentation

## 2015-04-06 DIAGNOSIS — Z87891 Personal history of nicotine dependence: Secondary | ICD-10-CM | POA: Insufficient documentation

## 2015-04-06 HISTORY — PX: KNEE ARTHROSCOPY WITH MEDIAL MENISECTOMY: SHX5651

## 2015-04-06 HISTORY — DX: Type 2 diabetes mellitus without complications: E11.9

## 2015-04-06 LAB — HEMOGLOBIN A1C
Hgb A1c MFr Bld: 5.9 % — ABNORMAL HIGH (ref 4.8–5.6)
MEAN PLASMA GLUCOSE: 123 mg/dL

## 2015-04-06 LAB — GLUCOSE, CAPILLARY
GLUCOSE-CAPILLARY: 117 mg/dL — AB (ref 65–99)
Glucose-Capillary: 93 mg/dL (ref 65–99)

## 2015-04-06 SURGERY — ARTHROSCOPY, KNEE, WITH MEDIAL MENISCECTOMY
Anesthesia: General | Site: Knee | Laterality: Left

## 2015-04-06 MED ORDER — SCOPOLAMINE 1 MG/3DAYS TD PT72
1.0000 | MEDICATED_PATCH | TRANSDERMAL | Status: DC
  Administered 2015-04-06: 1.5 mg via TRANSDERMAL
  Filled 2015-04-06: qty 1

## 2015-04-06 MED ORDER — BUPIVACAINE HCL (PF) 0.25 % IJ SOLN
INTRAMUSCULAR | Status: DC | PRN
  Administered 2015-04-06: 10 mL

## 2015-04-06 MED ORDER — OXYCODONE HCL 5 MG PO TABS: 5.0000 mg | ORAL_TABLET | Freq: Once | ORAL | Status: DC | PRN

## 2015-04-06 MED ORDER — ACETAMINOPHEN 325 MG PO TABS
650.0000 mg | ORAL_TABLET | Freq: Once | ORAL | Status: AC
  Administered 2015-04-06: 650 mg via ORAL

## 2015-04-06 MED ORDER — ACETAMINOPHEN 325 MG PO TABS
ORAL_TABLET | ORAL | Status: AC
  Filled 2015-04-06: qty 2

## 2015-04-06 MED ORDER — MIDAZOLAM HCL 2 MG/2ML IJ SOLN
INTRAMUSCULAR | Status: AC
  Filled 2015-04-06: qty 4

## 2015-04-06 MED ORDER — FENTANYL CITRATE (PF) 100 MCG/2ML IJ SOLN
INTRAMUSCULAR | Status: DC | PRN
Start: 2015-04-06 — End: 2015-04-06
  Administered 2015-04-06: 50 ug via INTRAVENOUS
  Administered 2015-04-06: 100 ug via INTRAVENOUS

## 2015-04-06 MED ORDER — PROPOFOL 10 MG/ML IV BOLUS
INTRAVENOUS | Status: DC | PRN
  Administered 2015-04-06: 30 mg via INTRAVENOUS
  Administered 2015-04-06: 170 mg via INTRAVENOUS

## 2015-04-06 MED ORDER — ONDANSETRON HCL 4 MG PO TABS: 4.0000 mg | ORAL_TABLET | Freq: Three times a day (TID) | ORAL | Status: DC | PRN

## 2015-04-06 MED ORDER — LIDOCAINE HCL (CARDIAC) 20 MG/ML IV SOLN
INTRAVENOUS | Status: AC
  Filled 2015-04-06: qty 5

## 2015-04-06 MED ORDER — DEXAMETHASONE SODIUM PHOSPHATE 4 MG/ML IJ SOLN
INTRAMUSCULAR | Status: AC
  Filled 2015-04-06: qty 1

## 2015-04-06 MED ORDER — SCOPOLAMINE 1 MG/3DAYS TD PT72
MEDICATED_PATCH | TRANSDERMAL | Status: AC
  Administered 2015-04-06: 1.5 mg via TRANSDERMAL
  Filled 2015-04-06: qty 1

## 2015-04-06 MED ORDER — ONDANSETRON HCL 4 MG/2ML IJ SOLN: 4.0000 mg | Freq: Once | INTRAMUSCULAR | Status: DC | PRN

## 2015-04-06 MED ORDER — DEXAMETHASONE SODIUM PHOSPHATE 10 MG/ML IJ SOLN
INTRAMUSCULAR | Status: DC | PRN
  Administered 2015-04-06: 4 mg via INTRAVENOUS

## 2015-04-06 MED ORDER — FENTANYL CITRATE (PF) 100 MCG/2ML IJ SOLN: 25.0000 ug | INTRAMUSCULAR | Status: DC | PRN

## 2015-04-06 MED ORDER — MIDAZOLAM HCL 5 MG/5ML IJ SOLN
INTRAMUSCULAR | Status: DC | PRN
  Administered 2015-04-06: 2 mg via INTRAVENOUS

## 2015-04-06 MED ORDER — FENTANYL CITRATE (PF) 250 MCG/5ML IJ SOLN
INTRAMUSCULAR | Status: AC
Start: 2015-04-06 — End: 2015-04-06
  Filled 2015-04-06: qty 5

## 2015-04-06 MED ORDER — SUCCINYLCHOLINE CHLORIDE 20 MG/ML IJ SOLN
INTRAMUSCULAR | Status: DC | PRN
  Administered 2015-04-06: 120 mg via INTRAVENOUS

## 2015-04-06 MED ORDER — ONDANSETRON HCL 4 MG/2ML IJ SOLN
INTRAMUSCULAR | Status: DC | PRN
  Administered 2015-04-06: 4 mg via INTRAVENOUS

## 2015-04-06 MED ORDER — SODIUM CHLORIDE 0.9 % IR SOLN
Status: DC | PRN
  Administered 2015-04-06 (×2): 3000 mL

## 2015-04-06 MED ORDER — HYDROCODONE-ACETAMINOPHEN 5-325 MG PO TABS: 1.0000 | ORAL_TABLET | Freq: Four times a day (QID) | ORAL | Status: DC | PRN

## 2015-04-06 MED ORDER — OXYCODONE HCL 5 MG/5ML PO SOLN: 5.0000 mg | Freq: Once | ORAL | Status: DC | PRN

## 2015-04-06 MED ORDER — ONDANSETRON HCL 4 MG/2ML IJ SOLN
INTRAMUSCULAR | Status: AC
  Filled 2015-04-06: qty 2

## 2015-04-06 MED ORDER — LACTATED RINGERS IV SOLN
INTRAVENOUS | Status: DC
  Administered 2015-04-06: 12:00:00 via INTRAVENOUS

## 2015-04-06 MED ORDER — SUCCINYLCHOLINE CHLORIDE 20 MG/ML IJ SOLN
INTRAMUSCULAR | Status: AC
  Filled 2015-04-06: qty 1

## 2015-04-06 MED ORDER — BUPIVACAINE HCL (PF) 0.25 % IJ SOLN
INTRAMUSCULAR | Status: AC
  Filled 2015-04-06: qty 30

## 2015-04-06 MED ORDER — LIDOCAINE HCL (CARDIAC) 20 MG/ML IV SOLN
INTRAVENOUS | Status: DC | PRN
  Administered 2015-04-06: 40 mg via INTRAVENOUS

## 2015-04-06 SURGICAL SUPPLY — 40 items
BANDAGE ELASTIC 6 VELCRO ST LF (GAUZE/BANDAGES/DRESSINGS) ×2 IMPLANT
BANDAGE ESMARK 6X9 LF (GAUZE/BANDAGES/DRESSINGS) ×1 IMPLANT
BLADE CUDA 5.5 (BLADE) IMPLANT
BLADE CUTTER GATOR 3.5 (BLADE) ×2 IMPLANT
BLADE GREAT WHITE 4.2 (BLADE) ×2 IMPLANT
BLADE SURG 11 STRL SS (BLADE) IMPLANT
BLADE SURG ROTATE 9660 (MISCELLANEOUS) IMPLANT
BNDG ESMARK 6X9 LF (GAUZE/BANDAGES/DRESSINGS) ×2
BUR OVAL 6.0 (BURR) IMPLANT
COVER SURGICAL LIGHT HANDLE (MISCELLANEOUS) ×2 IMPLANT
CUFF TOURNIQUET SINGLE 34IN LL (TOURNIQUET CUFF) IMPLANT
CUFF TOURNIQUET SINGLE 44IN (TOURNIQUET CUFF) ×2 IMPLANT
DRAPE ARTHROSCOPY W/POUCH 114 (DRAPES) ×2 IMPLANT
DRAPE SURG 17X23 STRL (DRAPES) ×4 IMPLANT
DRAPE U-SHAPE 47X51 STRL (DRAPES) IMPLANT
DRSG PAD ABDOMINAL 8X10 ST (GAUZE/BANDAGES/DRESSINGS) ×2 IMPLANT
DURAPREP 26ML APPLICATOR (WOUND CARE) ×2 IMPLANT
ELECT CAUTERY BLADE 6.4 (BLADE) IMPLANT
FACESHIELD WRAPAROUND (MASK) ×4 IMPLANT
GAUZE SPONGE 4X4 12PLY STRL (GAUZE/BANDAGES/DRESSINGS) ×2 IMPLANT
GAUZE XEROFORM 1X8 LF (GAUZE/BANDAGES/DRESSINGS) ×2 IMPLANT
GLOVE NEODERM STRL 7.5 LF PF (GLOVE) ×1 IMPLANT
GLOVE SURG NEODERM 7.5  LF PF (GLOVE) ×1
GLOVE SURG SYN 7.5  E (GLOVE) ×1
GLOVE SURG SYN 7.5 E (GLOVE) ×1 IMPLANT
GOWN STRL REIN XL XLG (GOWN DISPOSABLE) ×2 IMPLANT
KIT ROOM TURNOVER OR (KITS) ×2 IMPLANT
MANIFOLD NEPTUNE II (INSTRUMENTS) ×2 IMPLANT
NS IRRIG 1000ML POUR BTL (IV SOLUTION) IMPLANT
PACK ARTHROSCOPY DSU (CUSTOM PROCEDURE TRAY) ×2 IMPLANT
PAD ARMBOARD 7.5X6 YLW CONV (MISCELLANEOUS) ×4 IMPLANT
PADDING CAST COTTON 6X4 STRL (CAST SUPPLIES) ×2 IMPLANT
SET ARTHROSCOPY TUBING (MISCELLANEOUS) ×1
SET ARTHROSCOPY TUBING LN (MISCELLANEOUS) ×1 IMPLANT
SPONGE LAP 4X18 X RAY DECT (DISPOSABLE) ×2 IMPLANT
SUT ETHILON 3 0 PS 1 (SUTURE) ×2 IMPLANT
TOWEL OR 17X24 6PK STRL BLUE (TOWEL DISPOSABLE) ×2 IMPLANT
TOWEL OR 17X26 10 PK STRL BLUE (TOWEL DISPOSABLE) ×2 IMPLANT
WAND HAND CNTRL MULTIVAC 90 (MISCELLANEOUS) IMPLANT
WATER STERILE IRR 1000ML POUR (IV SOLUTION) ×2 IMPLANT

## 2015-04-06 NOTE — Anesthesia Postprocedure Evaluation (Signed)
  Anesthesia Post-op Note  Patient: Desiree Schultz  Procedure(s) Performed: Procedure(s): LEFT KNEE ARTHROSCOPY WITH PARTIAL MEDIAL MENISCECTOMY (Left)  Patient Location: PACU  Anesthesia Type:General  Level of Consciousness: awake, alert  and oriented  Airway and Oxygen Therapy: Patient Spontanous Breathing and Patient connected to nasal cannula oxygen  Post-op Pain: mild  Post-op Assessment: Post-op Vital signs reviewed, Patient's Cardiovascular Status Stable, Respiratory Function Stable and Patent Airway              Post-op Vital Signs: stable  Last Vitals:  Filed Vitals:   04/06/15 1509  BP: 138/83  Pulse: 63  Temp:   Resp:     Complications: No apparent anesthesia complications

## 2015-04-06 NOTE — Discharge Instructions (Signed)
Postoperative instructions: ° °Weightbearing: as tolerated ° °Keep your dressing and/or splint clean and dry at all times.  You can remove your dressing on post-operative day #3 and change with a dry/sterile dressing or Band-Aids as needed thereafter.   ° °Incision instructions:  Do not soak your incision for 3 weeks after surgery.  If the incision gets wet, pat dry and do not scrub the incision. ° °Pain control:  You have been given a prescription to be taken as directed for post-operative pain control.  In addition, elevate the operative extremity above the heart at all times to prevent swelling and throbbing pain. ° °Take over-the-counter Colace, 100mg by mouth twice a day while taking narcotic pain medications to help prevent constipation. ° °Follow up appointments: °1) 10-14 days for suture removal and wound check. °2) Dr. Donyale Berthold as scheduled. ° ° ------------------------------------------------------------------------------------------------------------- ° °After Surgery Pain Control: ° °After your surgery, post-surgical discomfort or pain is likely. This discomfort can last several days to a few weeks. At certain times of the day your discomfort may be more intense.  °Did you receive a nerve block?  °A nerve block can provide pain relief for one hour to two days after your surgery. As long as the nerve block is working, you will experience little or no sensation in the area the surgeon operated on.  °As the nerve block wears off, you will begin to experience pain or discomfort. It is very important that you begin taking your prescribed pain medication before the nerve block fully wears off. Treating your pain at the first sign of the block wearing off will ensure your pain is better controlled and more tolerable when full-sensation returns. Do not wait until the pain is intolerable, as the medicine will be less effective. It is better to treat pain in advance than to try and catch up.  °General Anesthesia:  °If  you did not receive a nerve block during your surgery, you will need to start taking your pain medication shortly after your surgery and should continue to do so as prescribed by your surgeon.  °Pain Medication:  °Most commonly we prescribe Vicodin and Percocet for post-operative pain. Both of these medications contain a combination of acetaminophen (Tylenol®) and a narcotic to help control pain.  °· It takes between 30 and 45 minutes before pain medication starts to work. It is important to take your medication before your pain level gets too intense.  °· Nausea is a common side effect of many pain medications. You will want to eat something before taking your pain medicine to help prevent nausea.  °· If you are taking a prescription pain medication that contains acetaminophen, we recommend that you do not take additional over the counter acetaminophen (Tylenol®).  °Other pain relieving options:  °· Using a cold pack to ice the affected area a few times a day (15 to 20 minutes at a time) can help to relieve pain, reduce swelling and bruising.  °· Elevation of the affected area can also help to reduce pain and swelling. ° ° ° °

## 2015-04-06 NOTE — Transfer of Care (Signed)
Immediate Anesthesia Transfer of Care Note  Patient: Desiree Schultz  Procedure(s) Performed: Procedure(s): LEFT KNEE ARTHROSCOPY WITH PARTIAL MEDIAL MENISCECTOMY (Left)  Patient Location: PACU  Anesthesia Type:General  Level of Consciousness: awake, alert  and oriented  Airway & Oxygen Therapy: Patient Spontanous Breathing and Patient connected to face mask oxygen  Post-op Assessment: Report given to RN  Post vital signs: Reviewed  Last Vitals:  Filed Vitals:   04/06/15 1041  BP: 152/74  Pulse: 66  Temp: 36.8 C  Resp: 20    Complications: No apparent anesthesia complications

## 2015-04-06 NOTE — Anesthesia Preprocedure Evaluation (Addendum)
Anesthesia Evaluation  Patient identified by MRN, date of birth, ID band Patient awake    Reviewed: reviewed documented beta blocker date and time   History of Anesthesia Complications (+) PONV and history of anesthetic complications  Airway Mallampati: II  TM Distance: >3 FB Neck ROM: Full    Dental  (+) Teeth Intact   Pulmonary sleep apnea , former smoker,  breath sounds clear to auscultation        Cardiovascular + dysrhythmias Atrial Fibrillation Rhythm:Regular Rate:Normal     Neuro/Psych PSYCHIATRIC DISORDERS Anxiety Depression    GI/Hepatic   Endo/Other  diabetes, Type 2Hypothyroidism   Renal/GU      Musculoskeletal   Abdominal (+) + obese,   Peds  Hematology  (+) anemia ,   Anesthesia Other Findings   Reproductive/Obstetrics                            Anesthesia Physical Anesthesia Plan  ASA: III  Anesthesia Plan: General   Post-op Pain Management:    Induction: Intravenous  Airway Management Planned: Oral ETT  Additional Equipment:   Intra-op Plan:   Post-operative Plan: Extubation in OR  Informed Consent: I have reviewed the patients History and Physical, chart, labs and discussed the procedure including the risks, benefits and alternatives for the proposed anesthesia with the patient or authorized representative who has indicated his/her understanding and acceptance.   Dental advisory given  Plan Discussed with:   Anesthesia Plan Comments:        Anesthesia Quick Evaluation

## 2015-04-06 NOTE — Op Note (Signed)
   Surgery Date: 04/06/2015  Surgeon(s): Kenya Kook Ephriam Jenkins, MD  ANESTHESIA:  general  FLUIDS: Per anesthesia record.   ESTIMATED BLOOD LOSS: minimal  PREOPERATIVE DIAGNOSES:  1. Left knee medial meniscus tear 2. Left knee synovitis  POSTOPERATIVE DIAGNOSES:  1. Left knee medial meniscus tear 2. Left knee synovitis 3. Left knee chondromalacia patella  PROCEDURES PERFORMED:  1. Left knee arthroscopy with extensive synovectomy 2. Left knee arthroscopy with arthroscopic partial medial meniscectomy 3. Left knee arthroscopy with arthroscopic chondroplasty patella  DESCRIPTION OF PROCEDURE: Desiree Schultz is a 44 y.o.-year-old female with Left knee medial meniscus tear. Plans are to proceed with partial medial meniscectomy and diagnostic arthroscopy with debridement as indicated. Full discussion held regarding risks benefits alternatives and complications related surgical intervention. Conservative care options reviewed. All questions answered.  The patient was identified in the preoperative holding area and the operative extremity was marked. The patient was brought to the operating room and transferred to operating table in a supine position. Satisfactory general anesthesia was induced by Anesthesiology. The patient's Left knee was examined under anesthesia. Range of motion without limitation symmetric to contralateral side. Lachman exam, grade 0,  grade 0 pivot shift. No significant varus or valgus instability at 0 and 30 degrees. Negative posterior drawer, negative posterolateral drawer.   Standard anterolateral, anteromedial arthroscopy portals were obtained. The anteromedial portal was obtained with a spinal needle for localization under direct visualization with subsequent diagnostic findings.   Anteromedial and anterolateral chambers: moderate synovitis. The synovitis was debrided with a 4.2 mm full radius shaver through both the anteromedial and lateral portals.   Suprapatellar pouch and  gutters: moderate synovitis or debris. Patella chondral surface: Grade 2 Trochlear chondral surface: Grade 1 Patellofemoral tracking: normal Medial meniscus: radial tear of posterior horn.  Medial femoral condyle flexion bearing surface: Grade 0 Medial femoral condyle extension bearing surface: Grade 0 Medial tibial plateau: Grade 0 Anterior cruciate ligament:stable Posterior cruciate ligament:stable Lateral meniscus: normal.   Lateral femoral condyle flexion bearing surface: Grade 0 Lateral femoral condyle extension bearing surface: Grade 0 Lateral tibial plateau: Grade 0  After completion of synovectomy, diagnostic exam, and debridements as described, all compartments were checked and no residual debris remained. Hemostasis was achieved with the cautery wand. The portals were approximated with interrupted nylon suture. All excess fluid was expressed from the joint. The portals were approximated with interrupted nylon suture. Xeroform sterile gauze dressings were applied followed by Ace bandage and ice pack.   DISPOSITION: The patient was awakened from general anesthetic, extubated, taken to the recovery room in medically stable condition, no apparent complications. The patient may be weightbearing as tolerated in Left lower extremity.  Range of motion of right knee as tolerated.

## 2015-04-06 NOTE — H&P (Signed)
PREOPERATIVE H&P  Chief Complaint: left knee medial meniscal tear  HPI: Desiree Schultz is a 44 y.o. female who presents for surgical treatment of left knee medial meniscal tear.  She denies any changes in medical history.  Past Medical History  Diagnosis Date  . Atrial fibrillation     2006  . Menorrhagia     2011  . Hypothyroid   . Prediabetes   . Depression   . PCOS (polycystic ovarian syndrome)   . OSA (obstructive sleep apnea)     on CPAP  . Obesity (BMI 30-39.9)   . Chronic anxiety   . Vitamin D deficiency   . Allergic rhinitis   . Hx of cardiovascular stress test     a. Myoview 10/13:  no ischemia; EF 57%  . Elevated blood pressure     On therapy. Probably hypertension  . Dysrhythmia   . Bronchial breathing   . Anemia   . PONV (postoperative nausea and vomiting)     C/o motion sickness   Past Surgical History  Procedure Laterality Date  . Endometrial ablation    . Dilation and curettage, diagnostic / therapeutic    . Tonsillectomy  2007  . Bladder surgery  1997    interstitial cystitis   . Hand surgery  2006    due to cat bite  . Renal biopsy, open  2006  . Endometrial ablation  08/2010  . Ovarian cyst removal  04/28/13  . Breast surgery      reduction   History   Social History  . Marital Status: Single    Spouse Name: N/A  . Number of Children: N/A  . 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 Smoking status: Former Smoker -- 2.00 packs/day for 16 years    Types: Cigarettes    Quit date: 09/03/1998  . Smokeless tobacco: Not on file  . Alcohol Use: Yes     Comment: maybe 2 a mth  . Drug Use: Yes    Special: Marijuana     Comment: in high school -- none since  . Sexual Activity: Not on file   Other Topics Concern  . Not on file   Social History Narrative   Pt lives in [place] with friend.  Works as a Designer, jewellery   Family History  Problem Relation Age of Onset  .  Cardiomyopathy Father   . Heart disease Father   . Hyperlipidemia Father   . Hypertension Father   . Hypertension      fathers side  . Hyperlipidemia      fathers side  . Heart failure      both grandparents - paternal  . Coronary artery disease Maternal Grandmother   . Heart attack Maternal Grandmother     and other maternal family members  . Heart attack Maternal Grandfather    Allergies  Allergen Reactions  . Benadryl [Diphenhydramine Hcl]     itching  . Fish Oil     Makes hands raw, mouth and female area   . Influenza Vaccines     Gives pt the flu and site of injection swells and hot  . Latex     Burns skin  . Other Nausea Only    opiates  . Sulfur     Bladder spasms, mouth raw  . Tape     Burns skin, band-aid, ekg stickers   Prior to Admission  medications   Medication Sig Start Date End Date Taking? Authorizing Provider  albuterol (PROVENTIL HFA;VENTOLIN HFA) 108 (90 BASE) MCG/ACT inhaler Inhale 2 puffs into the lungs every 6 (six) hours as needed for wheezing or shortness of breath. 03/16/15  Yes Micheline Chapman, NP  cetirizine (ZYRTEC) 10 MG tablet Take 10 mg by mouth daily.     Yes Historical Provider, MD  escitalopram (LEXAPRO) 20 MG tablet Take 1 tablet (20 mg total) by mouth daily. 03/16/15  Yes Micheline Chapman, NP  levothyroxine (SYNTHROID) 175 MCG tablet Take 1 tablet (175 mcg total) by mouth daily before breakfast. 03/04/15  Yes Micheline Chapman, NP  metFORMIN (GLUMETZA) 1000 MG (MOD) 24 hr tablet Take 1 tablet (1,000 mg total) by mouth daily. 01/06/15  Yes Leana Gamer, MD  metoprolol succinate (TOPROL-XL) 25 MG 24 hr tablet Take 25 mg by mouth daily.   Yes Historical Provider, MD  propafenone (RYTHMOL SR) 225 MG 12 hr capsule Take 225 mg by mouth daily.   Yes Historical Provider, MD  spironolactone (ALDACTONE) 50 MG tablet Take 25 mg by mouth daily.   Yes Historical Provider, MD  amoxicillin-clavulanate (AUGMENTIN) 875-125 MG per tablet Take 1 tablet by  mouth 2 (two) times daily. For 10 days 03/16/15   Micheline Chapman, NP  apixaban (ELIQUIS) 5 MG TABS tablet Take 1 tablet (5 mg total) by mouth 2 (two) times daily. 01/26/15   Tresa Garter, MD  Ascorbic Acid (VITAMIN C) 1000 MG tablet Take 1,000 mg by mouth daily.    Historical Provider, MD  atenolol (TENORMIN) 25 MG tablet Take 1 tablet (25 mg total) by mouth daily. 03/02/15   Deboraha Sprang, MD  chlorpheniramine-HYDROcodone [facility] ER) 10-8 MG/5ML SUER Take 5 mLs by mouth every 12 (twelve) hours as needed for cough. 03/16/15   Micheline Chapman, NP  cholecalciferol (VITAMIN D) 400 UNITS TABS tablet Take 400 Units by mouth daily.    Historical Provider, MD  Multiple Vitamin (MULTIVITAMIN) capsule Take 1 capsule by mouth daily.    Historical Provider, MD  predniSONE (DELTASONE) 20 MG tablet Take 3 tablets a day for 2 days, then 2 tablets a day for 2 days, then one tablet a day for two days. 03/16/15   Micheline Chapman, NP  VITAMIN K PO Take 1 tablet by mouth daily.    Historical Provider, MD     Positive ROS: All other systems have been reviewed and were otherwise negative with the exception of those mentioned in the HPI and as above.  Physical Exam: General: Alert, no acute distress Cardiovascular: No pedal edema Respiratory: No cyanosis, no use of accessory musculature GI: abdomen soft Skin: No lesions in the area of chief complaint Neurologic: Sensation intact distally Psychiatric: Patient is competent for consent with normal mood and affect Lymphatic: no lymphedema  MUSCULOSKELETAL: exam stable  Assessment: left knee medial meniscal tear  Plan: Plan for Procedure(s): LEFT KNEE ARTHROSCOPY WITH PARTIAL MEDIAL MENISCECTOMY  The risks benefits and alternatives were discussed with the patient including but not limited to the risks of nonoperative treatment, versus surgical intervention including infection, bleeding, nerve injury,  blood clots, cardiopulmonary  complications, morbidity, mortality, among others, and they were willing to proceed.   Marianna Payment, MD   04/06/2015 6:48 AM

## 2015-04-06 NOTE — Anesthesia Procedure Notes (Signed)
Procedure Name: Intubation Date/Time: 04/06/2015 12:09 PM Performed by: Talbot Grumbling Pre-anesthesia Checklist: Patient identified, Emergency Drugs available, Suction available and Patient being monitored Patient Re-evaluated:Patient Re-evaluated prior to inductionOxygen Delivery Method: Circle system utilized Preoxygenation: Pre-oxygenation with 100% oxygen Intubation Type: IV induction Ventilation: Mask ventilation without difficulty Laryngoscope Size: Miller and 2 Grade View: Grade II Tube type: Oral Tube size: 7.5 mm Number of attempts: 1 Airway Equipment and Method: Stylet Placement Confirmation: ETT inserted through vocal cords under direct vision,  positive ETCO2 and breath sounds checked- equal and bilateral Secured at: 21 cm Tube secured with: Tape Dental Injury: Teeth and Oropharynx as per pre-operative assessment

## 2015-04-07 ENCOUNTER — Encounter (HOSPITAL_COMMUNITY): Payer: Self-pay | Admitting: Orthopaedic Surgery

## 2015-04-08 NOTE — Telephone Encounter (Signed)
Patient not feeling well - she just had knee surgery. She and I agreed to speak next week. She appreciated my understanding and thanked me.

## 2015-04-15 MED ORDER — DRONEDARONE HCL 400 MG PO TABS: 400.0000 mg | ORAL_TABLET | Freq: Two times a day (BID) | ORAL | Status: DC

## 2015-04-15 NOTE — Telephone Encounter (Signed)
Patient has been off Celexa for more than 2 weeks. She has not taken her propafenone today.  She will discontinue use of this and start Multaq tonight. She and I will touch base by end of next week to see if she would like me to send in rx for Multaq (she is trying samples currently).

## 2015-04-20 ENCOUNTER — Encounter: Payer: Self-pay | Admitting: Family Medicine

## 2015-04-22 NOTE — Telephone Encounter (Signed)
Left message informing patient to call office is she needs refill sent in for [facility]

## 2015-04-25 ENCOUNTER — Other Ambulatory Visit: Payer: Self-pay | Admitting: Family Medicine

## 2015-04-29 ENCOUNTER — Ambulatory Visit (INDEPENDENT_AMBULATORY_CARE_PROVIDER_SITE_OTHER): Payer: No Typology Code available for payment source | Admitting: Family Medicine

## 2015-04-29 VITALS — BP 127/82 | HR 77 | Temp 98.6°F | Resp 16 | Ht 65.0 in | Wt 300.0 lb

## 2015-04-29 DIAGNOSIS — E039 Hypothyroidism, unspecified: Secondary | ICD-10-CM

## 2015-04-29 DIAGNOSIS — N289 Disorder of kidney and ureter, unspecified: Secondary | ICD-10-CM

## 2015-04-29 NOTE — Progress Notes (Signed)
Patient ID: Desiree Schultz, female   DOB: [birth year], 44 y.o.   MRN: [id number]   Desiree Schultz, is a 44 y.o. female  [id number]  [id number]  DOB - Sep 05, 1970  CC:  Chief Complaint  Patient presents with  . Follow-up    wants urine checked and thyroid levels        HPI: Desiree Schultz is a 44 y.o. female here to follow-up visit. We recently increased her synthroid and it is time to check a TSH.Also she recently has an arthroscopy of her knee earlier this month and bloodwork show a creatinine of 1.7 and she wishes that rechecked. She also is concerned about protein in her urine. She has a history of prediabetes, hypo thyroidism, vitamin d deficiency, atrial fib and PCOS and obesity. Her medications are listed below and include metformin and spirolactone. Allergies  Allergen Reactions  . Benadryl [Diphenhydramine Hcl]     itching  . Fish Oil     Makes hands raw, mouth and female area   . Influenza Vaccines     Gives pt the flu and site of injection swells and hot  . Latex     Burns skin  . Other Nausea Only    opiates  . Sulfur     Bladder spasms, mouth raw  . Tape     Burns skin, band-aid, ekg stickers   Past Medical History  Diagnosis Date  . Atrial fibrillation     2006  . Menorrhagia     2011  . Hypothyroid   . Prediabetes   . Depression   . PCOS (polycystic ovarian syndrome)   . OSA (obstructive sleep apnea)     on CPAP  . Obesity (BMI 30-39.9)   . Chronic anxiety   . Vitamin D deficiency   . Allergic rhinitis   . Hx of cardiovascular stress test     a. Myoview 10/13:  no ischemia; EF 57%  . Elevated blood pressure     On therapy. Probably hypertension  . Dysrhythmia   . Bronchial breathing   . Anemia   . PONV (postoperative nausea and vomiting)     C/o motion sickness  . Diabetes mellitus without complication    Current Outpatient Prescriptions on File Prior to Visit  Medication Sig Dispense Refill  . albuterol (PROVENTIL HFA;VENTOLIN HFA) 108  (90 BASE) MCG/ACT inhaler Inhale 2 puffs into the lungs every 6 (six) hours as needed for wheezing or shortness of breath. 1 Inhaler 1  . apixaban (ELIQUIS) 5 MG TABS tablet Take 1 tablet (5 mg total) by mouth 2 (two) times daily. 180 tablet 2  . Ascorbic Acid (VITAMIN C) 1000 MG tablet Take 1,000 mg by mouth daily.    Marland Kitchen atenolol (TENORMIN) 25 MG tablet Take 1 tablet (25 mg total) by mouth daily. 30 tablet 3  . cetirizine (ZYRTEC) 10 MG tablet Take 10 mg by mouth daily.      . chlorpheniramine-HYDROcodone (TUSSIONEX PENNKINETIC ER) 10-8 MG/5ML SUER Take 5 mLs by mouth every 12 (twelve) hours as needed for cough. 140 mL 0  . cholecalciferol (VITAMIN D) 400 UNITS TABS tablet Take 400 Units by mouth daily.    Marland Kitchen dronedarone (MULTAQ) 400 MG tablet Take 1 tablet (400 mg total) by mouth 2 (two) times daily with a meal. 60 tablet 6  . escitalopram (LEXAPRO) 20 MG tablet Take 1 tablet (20 mg total) by mouth daily. 30 tablet 1  . HYDROcodone-acetaminophen (NORCO) 5-325 MG per tablet Take 1-2 tablets  by mouth every 6 (six) hours as needed. 90 tablet 0  . levothyroxine (SYNTHROID) 175 MCG tablet Take 1 tablet (175 mcg total) by mouth daily before breakfast. 60 tablet 0  . metFORMIN (GLUMETZA) 1000 MG (MOD) 24 hr tablet Take 1 tablet (1,000 mg total) by mouth daily. 30 tablet 6  . metoprolol succinate (TOPROL-XL) 25 MG 24 hr tablet Take 25 mg by mouth daily.    . Multiple Vitamin (MULTIVITAMIN) capsule Take 1 capsule by mouth daily.    . ondansetron (ZOFRAN) 4 MG tablet Take 1-2 tablets (4-8 mg total) by mouth every 8 (eight) hours as needed for nausea or vomiting. 40 tablet 0  . ondansetron (ZOFRAN) 4 MG tablet Take 1-2 tablets (4-8 mg total) by mouth every 8 (eight) hours as needed for nausea or vomiting. 40 tablet 0  . predniSONE (DELTASONE) 20 MG tablet Take 3 tablets a day for 2 days, then 2 tablets a day for 2 days, then one tablet a day for two days. 12 tablet 0  . spironolactone (ALDACTONE) 50 MG tablet  Take 25 mg by mouth daily.    Marland Kitchen spironolactone (ALDACTONE) 50 MG tablet TAKE 1 TABLET BY MOUTH DAILY. 30 tablet 3  . VITAMIN K PO Take 1 tablet by mouth daily.    Marland Kitchen amoxicillin-clavulanate (AUGMENTIN) 875-125 MG per tablet Take 1 tablet by mouth 2 (two) times daily. For 10 days (Patient not taking: Reported on 04/29/2015) 20 tablet 0  . [DISCONTINUED] ipratropium (ATROVENT) 0.06 % nasal spray Place 2 sprays into both nostrils 4 (four) times daily. (Patient not taking: Reported on 12/09/2014) 15 mL 0  . [DISCONTINUED] progesterone (PROMETRIUM) 200 MG capsule Take 1 capsule (200 mg total) by mouth daily. (Patient not taking: Reported on 12/09/2014) 14 capsule 3   No current facility-administered medications on file prior to visit.   Family History  Problem Relation Age of Onset  . Cardiomyopathy Father   . Heart disease Father   . Hyperlipidemia Father   . Hypertension Father   . Hypertension      fathers side  . Hyperlipidemia      fathers side  . Heart failure      both grandparents - paternal  . Coronary artery disease Maternal Grandmother   . Heart attack Maternal Grandmother     and other maternal family members  . Heart attack Maternal Grandfather    Social History   Social History  . Marital Status: Single    Spouse Name: N/A  . Number of Children: N/A  . 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 Smoking status: Former Smoker -- 2.00 packs/day for 16 years    Types: Cigarettes    Quit date: 09/03/1998  . Smokeless tobacco: Never Used  . Alcohol Use: Yes     Comment: maybe 2 a mth  . Drug Use: Yes    Special: Marijuana     Comment: in high school -- none since  . Sexual Activity: Not on file   Other Topics Concern  . Not on file   Social History Narrative   Pt lives in [place] with friend.  Works as a Designer, jewellery    Review of Systems: Constitutional: Negative for fever, chills, appetite change, weight  loss. Positive  fatigue. HENT: Negative for ear pain, ear discharge.nose bleeds Eyes: Negative for pain, discharge, redness, itching and visual disturbance. Neck: Negative for pain, stiffness Respiratory: Negative for  cough, shortness of breath,   Cardiovascular: Negative for chest pain, palpitations and leg swelling. Gastrointestinal: Negative for abdominal distention, abdominal pain, nausea, vomiting, diarrhea, constipations Genitourinary: Negative for dysuria, urgency, frequency, hematuria, flank pain. Positve for frothy urnine. Musculoskeletal: Negative for back pain, joint pain, joint  swelling, arthralgia and gait problem.Negative for weakness. Neurological: Negative for dizziness, tremors, seizures, syncope,   light-headedness, numbness and headaches.  Hematological: Negative for easy bruising or bleeding Psychiatric/Behavioral: Negative for depression, anxiety, decreased concentration, confusion    Objective:   Filed Vitals:   04/29/15 1522  BP: 127/82  Pulse: 77  Temp: 98.6 F (37 C)  Resp: 16    Physical Exam: Constitutional: Patient appears well-developed and well-nourished. No distress. HENT: Normocephalic, atraumatic, External right and left ear normal. Oropharynx is clear and moist.  Eyes: Conjunctivae and EOM are normal. PERRLA, no scleral icterus. Neck: Normal ROM. Neck supple. No lymphadenopathy, No thyromegaly. CVS: RRR, S1/S2 +, no murmurs, no gallops, no rubs Pulmonary: Effort and breath sounds normal, no stridor, rhonchi, wheezes, rales.  Abdominal: Soft. Normoactive BS,, no distension, tenderness, rebound or guarding.  Musculoskeletal: Normal range of motion. No edema and no tenderness.  Neuro: Alert.Normal muscle tone coordination. Non-focal Skin: Skin is warm and dry. No rash noted. Not diaphoretic. No erythema. No pallor. Psychiatric: Normal mood and affect. Behavior, judgment, thought content normal.  Lab Results  Component Value Date   WBC 8.8  04/05/2015   HGB 14.5 04/05/2015   HCT 41.9 04/05/2015   MCV 91.7 04/05/2015   PLT 309 04/05/2015   Lab Results  Component Value Date   CREATININE 0.89 04/29/2015   BUN 19 04/29/2015   NA 138 04/29/2015   K 4.2 04/29/2015   CL 105 04/29/2015   CO2 23 04/29/2015    Lab Results  Component Value Date   HGBA1C 5.9* 04/05/2015   Lipid Panel     Component Value Date/Time   CHOL 161 12/30/2014 1645   TRIG 128 12/30/2014 1645   HDL 30* 12/30/2014 1645   CHOLHDL 5.4 12/30/2014 1645   VLDL 26 12/30/2014 1645   LDLCALC 105* 12/30/2014 1645       Assessment and plan:   1. Hypothyroidism, unspecified hypothyroidism type  - TSH  2. Renal insufficiency  - COMPLETE METABOLIC PANEL WITH GFR - Microalbumin, urine   No Follow-up on file.  The patient was given clear instructions to go to ER or return to medical center if symptoms don't improve, worsen or new problems develop. The patient verbalized understanding.      Micheline Chapman, MSN, FNP-BC   05/01/2015, 11:21 AM

## 2015-04-29 NOTE — Patient Instructions (Signed)
Keep well hydrated We are rechecking TSH and kidney function. I want you to collect a 24 hour urine for protein.

## 2015-04-30 LAB — COMPLETE METABOLIC PANEL WITH GFR
ALT: 31 U/L — AB (ref 6–29)
AST: 25 U/L (ref 10–35)
Albumin: 3.6 g/dL (ref 3.6–5.1)
Alkaline Phosphatase: 83 U/L (ref 33–115)
BUN: 19 mg/dL (ref 7–25)
CHLORIDE: 105 mmol/L (ref 98–110)
CO2: 23 mmol/L (ref 20–31)
CREATININE: 0.89 mg/dL (ref 0.50–1.10)
Calcium: 8.4 mg/dL — ABNORMAL LOW (ref 8.6–10.2)
GFR, Est African American: >89 mL/min (ref >=60)
GFR, Est Non African American: 79 mL/min (ref >=60)
Glucose, Bld: 94 mg/dL (ref 65–99)
Potassium: 4.2 mmol/L (ref 3.5–5.3)
Sodium: 138 mmol/L (ref 135–146)
Total Bilirubin: 0.3 mg/dL (ref 0.2–1.2)
Total Protein: 6.2 g/dL (ref 6.1–8.1)

## 2015-04-30 LAB — MICROALBUMIN, URINE: Microalb, Ur: 129.2 mg/dL — ABNORMAL HIGH (ref <2.0)

## 2015-04-30 LAB — TSH: TSH: 0.499 u[IU]/mL (ref 0.350–4.500)

## 2015-05-01 ENCOUNTER — Encounter: Payer: Self-pay | Admitting: Family Medicine

## 2015-05-02 ENCOUNTER — Other Ambulatory Visit: Payer: Self-pay | Admitting: Family Medicine

## 2015-05-03 ENCOUNTER — Other Ambulatory Visit: Payer: Self-pay | Admitting: Family Medicine

## 2015-05-03 DIAGNOSIS — R809 Proteinuria, unspecified: Secondary | ICD-10-CM

## 2015-05-03 LAB — PROTEIN, URINE, 24 HOUR
PROTEIN 24H UR: 846 mg/d — AB (ref <150)
Protein, Urine: 31 mg/dL — ABNORMAL HIGH (ref 5–24)

## 2015-05-04 ENCOUNTER — Telehealth: Payer: Self-pay

## 2015-05-04 NOTE — Telephone Encounter (Signed)
Left message regarding lab work and advised patient of nephrology referral, asked patient to call back if any questions. Thanks!

## 2015-05-04 NOTE — Telephone Encounter (Signed)
-----   Message from Micheline Chapman, NP sent at 05/01/2015 11:37 AM EDT ----- Kidney function are back to normal according to bloodwork but there is protein in urine. Are awaiting 24 hours urine collection. TSH 0.499.

## 2015-05-11 ENCOUNTER — Ambulatory Visit: Payer: MEDICAID | Admitting: Physical Therapy

## 2015-05-13 ENCOUNTER — Telehealth: Payer: Self-pay

## 2015-05-13 ENCOUNTER — Other Ambulatory Visit: Payer: Self-pay | Admitting: Family Medicine

## 2015-05-13 ENCOUNTER — Telehealth: Payer: Self-pay | Admitting: *Deleted

## 2015-05-13 DIAGNOSIS — E039 Hypothyroidism, unspecified: Secondary | ICD-10-CM

## 2015-05-13 MED ORDER — LEVOTHYROXINE SODIUM 175 MCG PO TABS: 175.0000 ug | ORAL_TABLET | Freq: Every day | ORAL | Status: DC

## 2015-05-13 MED ORDER — DRONEDARONE HCL 400 MG PO TABS: 400.0000 mg | ORAL_TABLET | Freq: Two times a day (BID) | ORAL | Status: DC

## 2015-05-13 NOTE — Telephone Encounter (Signed)
Spoke with patient and advised her that I am placing Multaq samples at front desk for her to pick up.  Patient thanked me and states she will come now and pick up.

## 2015-05-13 NOTE — Telephone Encounter (Signed)
Refill for thyroid medication sent into pharmacy. Thanks!

## 2015-05-13 NOTE — Telephone Encounter (Signed)
Pt called in about Multaq. She stated she is doing well on it. She has no complaints about the medication. She wanted to let Dr Olin Pia nurse know she is ok on Multaq and asked if I would send them a message. I explained that it looked [facility] and pt stated she would call them when they open and see if they received refill and if not she would call back and let us know.

## 2015-05-13 NOTE — Telephone Encounter (Signed)
Follow up   Pt states that a prescription called Multaq 400mg  [facility],  Pt states its a form that has to be filled out it will take 6 weeks   Pt states she don't have insurance and that we are out of samples and pt only have 4 pills left and she feels like the medication really works.  Pt wants to know how she can get more she is only taking 1 a day

## 2015-05-20 ENCOUNTER — Ambulatory Visit: Payer: No Typology Code available for payment source

## 2015-05-27 ENCOUNTER — Telehealth: Payer: Self-pay | Admitting: Internal Medicine

## 2015-05-27 ENCOUNTER — Telehealth: Payer: Self-pay | Admitting: Family Medicine

## 2015-05-27 NOTE — Telephone Encounter (Signed)
Patient called to check the status of nephrology appointment, and wanted to know if the providers(FNP and Nephrologist) had discussed starting a new medication.

## 2015-05-30 NOTE — Telephone Encounter (Signed)
The reason for the nephrologist appointment is to determine what is causing the problem. There is no treatment indicated until reason is known.Marland Kitchen

## 2015-05-30 NOTE — Telephone Encounter (Signed)
[name] Please advise. Thanks!  

## 2015-06-01 ENCOUNTER — Ambulatory Visit (HOSPITAL_COMMUNITY)
Admission: RE | Admit: 2015-06-01 | Discharge: 2015-06-01 | Disposition: A | Discharge status: Home / Self Care | Payer: No Typology Code available for payment source | Source: Ambulatory Visit | Attending: Orthopaedic Surgery | Admitting: Orthopaedic Surgery

## 2015-06-01 ENCOUNTER — Other Ambulatory Visit (HOSPITAL_COMMUNITY): Payer: Self-pay | Admitting: Orthopaedic Surgery

## 2015-06-01 DIAGNOSIS — R52 Pain, unspecified: Secondary | ICD-10-CM

## 2015-06-01 DIAGNOSIS — M7989 Other specified soft tissue disorders: Secondary | ICD-10-CM | POA: Insufficient documentation

## 2015-06-01 DIAGNOSIS — M79662 Pain in left lower leg: Secondary | ICD-10-CM | POA: Insufficient documentation

## 2015-06-01 DIAGNOSIS — G4733 Obstructive sleep apnea (adult) (pediatric): Secondary | ICD-10-CM | POA: Insufficient documentation

## 2015-06-01 DIAGNOSIS — R030 Elevated blood-pressure reading, without diagnosis of hypertension: Secondary | ICD-10-CM | POA: Insufficient documentation

## 2015-06-01 DIAGNOSIS — M660 Rupture of popliteal cyst: Secondary | ICD-10-CM | POA: Insufficient documentation

## 2015-06-01 DIAGNOSIS — R7309 Other abnormal glucose: Secondary | ICD-10-CM | POA: Insufficient documentation

## 2015-06-01 DIAGNOSIS — E119 Type 2 diabetes mellitus without complications: Secondary | ICD-10-CM | POA: Insufficient documentation

## 2015-06-01 NOTE — Telephone Encounter (Signed)
Spoke with [name] at [facility]. Patients record has been reviewed and per specialists Desiree Schultz can wait 3 to 4 months for appointment.    I called and spoke with Desiree Schultz and advised her of this and [name]'s response that she needs to see specialist to find out the reason. Thanks!

## 2015-06-07 ENCOUNTER — Other Ambulatory Visit: Payer: Self-pay | Admitting: Internal Medicine

## 2015-06-09 ENCOUNTER — Telehealth: Payer: Self-pay | Admitting: Family Medicine

## 2015-06-09 NOTE — Telephone Encounter (Signed)
Patient needs RX to be prescribed by PCP in order to receive discount.

## 2015-06-10 ENCOUNTER — Telehealth: Payer: Self-pay | Admitting: Family Medicine

## 2015-06-10 MED ORDER — DRONEDARONE HCL 400 MG PO TABS: 400.0000 mg | ORAL_TABLET | Freq: Two times a day (BID) | ORAL | Status: DC

## 2015-06-10 NOTE — Telephone Encounter (Signed)
This has been sent to pharmacy. Thanks!

## 2015-06-10 NOTE — Telephone Encounter (Signed)
Patient called stating her morning blood sugar was 106 and needs to know if her medication needs to be adjusted.

## 2015-06-10 NOTE — Telephone Encounter (Signed)
Left message advising that bs results are still normal and that all previous labs were normal. She can follow up with provider at next appointment. Thanks!

## 2015-06-13 ENCOUNTER — Telehealth: Payer: Self-pay | Admitting: Internal Medicine

## 2015-06-13 NOTE — Telephone Encounter (Signed)
New Message   Pt calling about moltaq samples. Please call back and discuss.

## 2015-06-13 NOTE — Telephone Encounter (Signed)
Left message to call back. Will leave samples at front desk.

## 2015-06-14 NOTE — Telephone Encounter (Signed)
Informed pt that samples were at the front desk. Pt verbalized understanding and states that she will pick those up tomorrow.

## 2015-06-14 NOTE — Telephone Encounter (Signed)
Left message to call back  

## 2015-06-21 NOTE — Telephone Encounter (Signed)
error 

## 2015-06-24 ENCOUNTER — Telehealth: Payer: Self-pay

## 2015-06-24 NOTE — Telephone Encounter (Signed)
Patient would like appointment before February follow up for Pap. Please call to advise.

## 2015-06-27 NOTE — Telephone Encounter (Signed)
Called and rescheduled patient pap for July 25, 2015. Thanks!

## 2015-06-28 ENCOUNTER — Other Ambulatory Visit: Payer: Self-pay | Admitting: Family Medicine

## 2015-06-28 DIAGNOSIS — K029 Dental caries, unspecified: Secondary | ICD-10-CM

## 2015-07-08 ENCOUNTER — Other Ambulatory Visit: Payer: Self-pay | Admitting: Internal Medicine

## 2015-07-08 MED ORDER — DRONEDARONE HCL 400 MG PO TABS: 400.0000 mg | ORAL_TABLET | Freq: Two times a day (BID) | ORAL | Status: DC

## 2015-07-13 ENCOUNTER — Other Ambulatory Visit: Payer: Self-pay

## 2015-07-15 ENCOUNTER — Telehealth: Payer: Self-pay | Admitting: Internal Medicine

## 2015-07-15 NOTE — Telephone Encounter (Signed)
°  New Message  1. Which medications need to be refilled? (please list name of each medication and dose if known)Samples of Multaq  2. Which pharmacy/location (including street and city if local pharmacy) is medication to be sent to?Come pick them up today if possible. Please call if you have any questions

## 2015-07-15 NOTE — Telephone Encounter (Signed)
Spoke to pt advised her we do not have samples. Advised her to call back next week

## 2015-07-21 ENCOUNTER — Telehealth: Payer: Self-pay | Admitting: Internal Medicine

## 2015-07-21 NOTE — Telephone Encounter (Signed)
Called patient and left message. Patient needs to follow up with the physician who ordered the lab work internal medicine. Dr. Caryl Comes is not in the office today. Patient has an appointment with internal medicine next week.

## 2015-07-21 NOTE — Telephone Encounter (Signed)
New Message    Pt calling stating that she has Protein [place] and due to her having the [employer] instead of regular insurance she is having a hard time getting into a Nephrologist and won't be able to see one until February. Pt wants to know what Dr. Caryl Comes recommends and if he thinks that it might be her Eliquis or any of her other medications that is causing this. Please call back and advise.

## 2015-07-21 NOTE — Telephone Encounter (Signed)
Patient wants Dr. Caryl Comes input on her Urine Protein being elevated and wants to know if it's due to taking Eliquis. Will forward to Dr. Caryl Comes for his advise.

## 2015-07-25 ENCOUNTER — Ambulatory Visit (INDEPENDENT_AMBULATORY_CARE_PROVIDER_SITE_OTHER): Payer: No Typology Code available for payment source | Admitting: Family Medicine

## 2015-07-25 ENCOUNTER — Encounter: Payer: Self-pay | Admitting: Family Medicine

## 2015-07-25 VITALS — BP 142/83 | Temp 98.4°F | Ht 65.5 in | Wt 315.0 lb

## 2015-07-25 DIAGNOSIS — R809 Proteinuria, unspecified: Secondary | ICD-10-CM

## 2015-07-25 DIAGNOSIS — E039 Hypothyroidism, unspecified: Secondary | ICD-10-CM

## 2015-07-25 LAB — LIPID PANEL
CHOLESTEROL: 184 mg/dL (ref 125–200)
HDL: 25 mg/dL — ABNORMAL LOW (ref >=46)
LDL CALC: 88 mg/dL (ref <130)
TRIGLYCERIDES: 354 mg/dL — AB (ref <150)
Total CHOL/HDL Ratio: 7.4 Ratio — ABNORMAL HIGH (ref <=5.0)
VLDL: 71 mg/dL — ABNORMAL HIGH (ref <30)

## 2015-07-25 LAB — COMPLETE METABOLIC PANEL WITH GFR
ALBUMIN: 3.8 g/dL (ref 3.6–5.1)
ALT: 29 U/L (ref 6–29)
AST: 24 U/L (ref 10–35)
Alkaline Phosphatase: 49 U/L (ref 33–115)
BILIRUBIN TOTAL: 0.2 mg/dL (ref 0.2–1.2)
BUN: 12 mg/dL (ref 7–25)
CALCIUM: 8.6 mg/dL (ref 8.6–10.2)
CO2: 23 mmol/L (ref 20–31)
CREATININE: 0.79 mg/dL (ref 0.50–1.10)
Chloride: 106 mmol/L (ref 98–110)
GFR, Est Non African American: >89 mL/min (ref >=60)
Glucose, Bld: 101 mg/dL — ABNORMAL HIGH (ref 65–99)
Potassium: 4 mmol/L (ref 3.5–5.3)
Sodium: 139 mmol/L (ref 135–146)
TOTAL PROTEIN: 6.8 g/dL (ref 6.1–8.1)

## 2015-07-25 LAB — POCT URINALYSIS DIP (DEVICE)
Bilirubin Urine: NEGATIVE
GLUCOSE, UA: NEGATIVE mg/dL
Ketones, ur: NEGATIVE mg/dL
Leukocytes, UA: NEGATIVE
Nitrite: NEGATIVE
PROTEIN: NEGATIVE mg/dL
Specific Gravity, Urine: 1.01 (ref 1.005–1.030)
UROBILINOGEN UA: 0.2 mg/dL (ref 0.0–1.0)
pH: 5.5 (ref 5.0–8.0)

## 2015-07-25 LAB — CBC WITH DIFFERENTIAL/PLATELET
BASOS ABS: 0 10*3/uL (ref 0.0–0.1)
Basophils Relative: 0 % (ref 0–1)
EOS ABS: 0.3 10*3/uL (ref 0.0–0.7)
EOS PCT: 3 % (ref 0–5)
HCT: 40.3 % (ref 36.0–46.0)
Hemoglobin: 13.4 g/dL (ref 12.0–15.0)
Lymphocytes Relative: 29 % (ref 12–46)
Lymphs Abs: 2.8 10*3/uL (ref 0.7–4.0)
MCH: 30.6 pg (ref 26.0–34.0)
MCHC: 33.3 g/dL (ref 30.0–36.0)
MCV: 92 fL (ref 78.0–100.0)
MPV: 10.8 fL (ref 8.6–12.4)
Monocytes Absolute: 0.7 10*3/uL (ref 0.1–1.0)
Monocytes Relative: 7 % (ref 3–12)
Neutro Abs: 5.9 10*3/uL (ref 1.7–7.7)
Neutrophils Relative %: 61 % (ref 43–77)
Platelets: 327 10*3/uL (ref 150–400)
RBC: 4.38 MIL/uL (ref 3.87–5.11)
RDW: 13.3 % (ref 11.5–15.5)
WBC: 9.6 10*3/uL (ref 4.0–10.5)

## 2015-07-25 NOTE — Progress Notes (Signed)
Patient ID: Desiree Schultz, female   DOB: [birth year], 44 y.o.   MRN: [id number]   Desiree Schultz, is a 44 y.o. female  [id number]  [id number]  DOB - 07-28-1971  CC:  Chief Complaint  Patient presents with  . pap smear    needs to discuss fluid pill and appointment with Nephrologist, which in not until February 2017, complaints of severe pain in feet and weight gain 15 lb in 3 months, complaints of eyes dipping water when she chews       HPI: Desiree Schultz is a 44 y.o. female was scheduled to come in today for a PAP smear. However her period started yesterday. She complains today of "fluid retention in her lower legs and feet, pain in her feet, 15 pound weight gain in 3 months and her eyes tearing when she chews. She takes all these symptoms to mean that she is retaining fluid. She was found several months ago to have proteinuria and has been referred to nephrology.She has an appointment in February. She was apparently told that if we did additional testing and felt it necessary, they might be able to move her appointment up. Her urine showed a protein level of 846 in August. Her GFR at that time was greater than 60. Allergies  Allergen Reactions  . Benadryl [Diphenhydramine Hcl]     itching  . Fish Oil     Makes hands raw, mouth and female area   . Influenza Vaccines     Gives pt the flu and site of injection swells and hot  . Latex     Burns skin  . Other Nausea Only    opiates  . Sulfur     Bladder spasms, mouth raw  . Tape     Burns skin, band-aid, ekg stickers   Past Medical History  Diagnosis Date  . Atrial fibrillation ([place])     2006  . Menorrhagia     2011  . Hypothyroid   . Prediabetes   . Depression   . PCOS (polycystic ovarian syndrome)   . OSA (obstructive sleep apnea)     on CPAP  . Obesity (BMI 30-39.9)   . Chronic anxiety   . Vitamin D deficiency   . Allergic rhinitis   . Hx of cardiovascular stress test     a. Myoview 10/13:  no ischemia; EF 57%   . Elevated blood pressure     On therapy. Probably hypertension  . Dysrhythmia   . Bronchial breathing   . Anemia   . PONV (postoperative nausea and vomiting)     C/o motion sickness  . Diabetes mellitus without complication [facility])    Current Outpatient Prescriptions on File Prior to Visit  Medication Sig Dispense Refill  . albuterol (PROVENTIL HFA;VENTOLIN HFA) 108 (90 BASE) MCG/ACT inhaler Inhale 2 puffs into the lungs every 6 (six) hours as needed for wheezing or shortness of breath. 1 Inhaler 1  . Ascorbic Acid (VITAMIN C) 1000 MG tablet Take 1,000 mg by mouth daily.    Marland Kitchen atenolol (TENORMIN) 25 MG tablet Take 1 tablet (25 mg total) by mouth daily. 30 tablet 3  . cetirizine (ZYRTEC) 10 MG tablet Take 10 mg by mouth daily.      . chlorpheniramine-HYDROcodone (TUSSIONEX PENNKINETIC ER) 10-8 MG/5ML SUER Take 5 mLs by mouth every 12 (twelve) hours as needed for cough. 140 mL 0  . cholecalciferol (VITAMIN D) 400 UNITS TABS tablet Take 400 Units by mouth daily.    Marland Kitchen  dronedarone (MULTAQ) 400 MG tablet Take 1 tablet (400 mg total) by mouth 2 (two) times daily with a meal. 180 tablet 3  . ELIQUIS 5 MG TABS tablet TAKE ONE TABLET 2 TIMES A DAY 60 tablet 1  . escitalopram (LEXAPRO) 20 MG tablet Take 1 tablet (20 mg total) by mouth daily. 30 tablet 1  . HYDROcodone-acetaminophen (NORCO) 5-325 MG per tablet Take 1-2 tablets by mouth every 6 (six) hours as needed. 90 tablet 0  . levothyroxine (SYNTHROID) 175 MCG tablet Take 1 tablet (175 mcg total) by mouth daily before breakfast. 60 tablet 1  . metFORMIN (GLUMETZA) 1000 MG (MOD) 24 hr tablet Take 1 tablet (1,000 mg total) by mouth daily. 30 tablet 6  . metoprolol succinate (TOPROL-XL) 25 MG 24 hr tablet Take 25 mg by mouth daily.    . Multiple Vitamin (MULTIVITAMIN) capsule Take 1 capsule by mouth daily.    . ondansetron (ZOFRAN) 4 MG tablet Take 1-2 tablets (4-8 mg total) by mouth every 8 (eight) hours as needed for nausea or vomiting. 40 tablet 0   . ondansetron (ZOFRAN) 4 MG tablet Take 1-2 tablets (4-8 mg total) by mouth every 8 (eight) hours as needed for nausea or vomiting. 40 tablet 0  . predniSONE (DELTASONE) 20 MG tablet Take 3 tablets a day for 2 days, then 2 tablets a day for 2 days, then one tablet a day for two days. 12 tablet 0  . spironolactone (ALDACTONE) 50 MG tablet Take 25 mg by mouth daily.    Marland Kitchen spironolactone (ALDACTONE) 50 MG tablet TAKE 1 TABLET BY MOUTH DAILY. 30 tablet 3  . VITAMIN K PO Take 1 tablet by mouth daily.    . [DISCONTINUED] ipratropium (ATROVENT) 0.06 % nasal spray Place 2 sprays into both nostrils 4 (four) times daily. (Patient not taking: Reported on 12/09/2014) 15 mL 0  . [DISCONTINUED] progesterone (PROMETRIUM) 200 MG capsule Take 1 capsule (200 mg total) by mouth daily. (Patient not taking: Reported on 12/09/2014) 14 capsule 3   No current facility-administered medications on file prior to visit.   Family History  Problem Relation Age of Onset  . Cardiomyopathy Father   . Heart disease Father   . Hyperlipidemia Father   . Hypertension Father   . Hypertension      fathers side  . Hyperlipidemia      fathers side  . Heart failure      both grandparents - paternal  . Coronary artery disease Maternal Grandmother   . Heart attack Maternal Grandmother     and other maternal family members  . Heart attack Maternal Grandfather    Social History   Social History  . Marital Status: Single    Spouse Name: N/A  . Number of Children: N/A  . 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 Smoking status: Former Smoker -- 2.00 packs/day for 16 years    Types: Cigarettes    Quit date: 09/03/1998  . Smokeless tobacco: Never Used  . Alcohol Use: Yes     Comment: maybe 2 a mth  . Drug Use: Yes    Special: Marijuana     Comment: in high school -- none since  . Sexual Activity: Not on file   Other Topics Concern  . Not on file   Social  History Narrative   Pt lives in [place] with friend.  Works as a Designer, jewellery  Review of Systems:  Const: Positive for fatigue HENT: Negative except for watery eyes when chewing.  Eyes: Negative  Neck: Negative  Respiratory: Positive for shortness of breath and wheezing Cardiovascular: Negative except for swelling of feet and lower legs Gastrointestinal: Negative except for heartburn  Genitourinary: Positive for reported decreased urination with dark urine. Musculoskeletal: Negative except for pain in ankles and feet.  Neurological: Negative  numbness  Hematological: Negative  Psychiatric/Behavioral: Negative     Objective:   Filed Vitals:   07/25/15 1523  BP: 142/83  Temp: 98.4 F (36.9 C)    Physical Exam: Constitutional: Patient appears well-developed and well-nourished. No distress. HENT: Normocephalic, atraumatic, External right and left ear normal. Oropharynx is clear and moist.  Eyes: Conjunctivae and EOM are normal. PERRLA, no scleral icterus. Neck: Normal ROM. Neck supple. No lymphadenopathy, No thyromegaly. CVS: RRR, S1/S2 +, no murmurs, no gallops, no rubs. There in minimal pedal edema, pulses normal.  Pulmonary: Effort and breath sounds normal, no stridor, rhonchi, wheezes, rales.  Abdominal: Soft. Normoactive BS,, no distension, tenderness, rebound or guarding.  Musculoskeletal: Normal range of motion. No edema and no tenderness.  Neuro: Alert.Normal muscle tone coordination. Non-focal Skin: Skin is warm and dry. No rash noted. Not diaphoretic. No erythema. No pallor. Psychiatric: Normal mood and affect. Behavior, judgment, thought content normal.  Lab Results  Component Value Date   WBC 8.8 04/05/2015   HGB 14.5 04/05/2015   HCT 41.9 04/05/2015   MCV 91.7 04/05/2015   PLT 309 04/05/2015   Lab Results  Component Value Date   CREATININE 0.79 07/25/2015   BUN 12 07/25/2015   NA 139 07/25/2015   K 4.0 07/25/2015   CL 106 07/25/2015   CO2 23  07/25/2015    Lab Results  Component Value Date   HGBA1C 5.9* 04/05/2015   Lipid Panel     Component Value Date/Time   CHOL 161 12/30/2014 1645   TRIG 128 12/30/2014 1645   HDL 30* 12/30/2014 1645   CHOLHDL 5.4 12/30/2014 1645   VLDL 26 12/30/2014 1645   LDLCALC 105* 12/30/2014 1645       Assessment and plan:   1. Proteinuria  - COMPLETE METABOLIC PANEL WITH GFR - CBC with Differential - Protein, urine, 24 hour; Future  2. Hypothyroidism, unspecified hypothyroidism type TSH  3. Hyperlipidemia -Lipid panel  4. Perceived fluid retention. -Will make decision about any treatment after labs.   Return for HTN, Diabetes.  The patient was given clear instructions to go to ER or return to medical center if symptoms don't improve, worsen or new problems develop. The patient verbalized understanding.      Micheline Chapman, MSN, FNP-BC   07/26/2015, 10:56 AM

## 2015-07-25 NOTE — Patient Instructions (Signed)
Continue medications are they are for now. Will let you know about labs and urine protein and if there is a need to add a diurectic.

## 2015-07-25 NOTE — Telephone Encounter (Signed)
NO DATA THAT IT DOES CAUSE PROTEINURIA

## 2015-07-26 LAB — TSH: TSH: 0.283 u[IU]/mL — ABNORMAL LOW (ref 0.350–4.500)

## 2015-07-26 NOTE — Telephone Encounter (Signed)
Called patient informed of Dr. Olin Pia response. Patient verbalized understanding.

## 2015-08-01 ENCOUNTER — Other Ambulatory Visit: Payer: Self-pay | Admitting: Family Medicine

## 2015-08-02 LAB — PROTEIN, URINE, 24 HOUR
Protein, 24H Urine: 360 mg/24 h — ABNORMAL HIGH (ref <150)
Protein, Urine: 10 mg/dL (ref 5–24)

## 2015-08-04 ENCOUNTER — Telehealth: Payer: Self-pay | Admitting: Family Medicine

## 2015-08-04 ENCOUNTER — Other Ambulatory Visit: Payer: Self-pay | Admitting: Family Medicine

## 2015-08-04 DIAGNOSIS — E785 Hyperlipidemia, unspecified: Secondary | ICD-10-CM

## 2015-08-04 NOTE — Telephone Encounter (Signed)
Spoke with patient regarding lab results. She now wants to know if you are going to prescribe lasix as discussed during visit?  She states she is still having the pain and swelling in feet. Patient authorized ok to leave detailed message since she is in class today. Thanks! Please advise.

## 2015-08-04 NOTE — Telephone Encounter (Signed)
Left message advising patient of no need for lasix at this time and to come in for fasting lipid panel when possible. Thanks !

## 2015-08-04 NOTE — Telephone Encounter (Signed)
Please call patient to discuss test results

## 2015-08-04 NOTE — Telephone Encounter (Signed)
I am sorry but Lasix is not going to help this. About the only thing that will help is weight loss and/or special shoes. Ican see about referring you to an orthopedist for the foot pain but not sure if I will be able to do so since you have an orange card.

## 2015-08-04 NOTE — Telephone Encounter (Signed)
Patient retured your call stating she is still swelling daily and has pain in her feet.

## 2015-08-04 NOTE — Telephone Encounter (Signed)
Labs and exam do not indicate a need for Lasix. i note very little if any pedal edema at your visit and lasix should not be used unless edema is persistent day and night.

## 2015-08-04 NOTE — Telephone Encounter (Signed)
Ms. Desiree Schultz, Please advise. Thanks!

## 2015-08-05 ENCOUNTER — Other Ambulatory Visit: Payer: Self-pay | Admitting: *Deleted

## 2015-08-05 DIAGNOSIS — E039 Hypothyroidism, unspecified: Secondary | ICD-10-CM

## 2015-08-05 MED ORDER — LEVOTHYROXINE SODIUM 175 MCG PO TABS: 175.0000 ug | ORAL_TABLET | Freq: Every day | ORAL | Status: DC

## 2015-08-05 NOTE — Telephone Encounter (Signed)
Patients Desiree Schultz has been refilled with 3 additional refills. Patient was last seen in [facility] on 07/25/15. Patient has TSH checked on 07/25/15.

## 2015-08-17 ENCOUNTER — Other Ambulatory Visit: Payer: Self-pay | Admitting: Internal Medicine

## 2015-08-17 DIAGNOSIS — E039 Hypothyroidism, unspecified: Secondary | ICD-10-CM

## 2015-08-17 MED ORDER — LEVOTHYROXINE SODIUM 175 MCG PO TABS: 175.0000 ug | ORAL_TABLET | Freq: Every day | ORAL | Status: DC

## 2015-08-19 ENCOUNTER — Other Ambulatory Visit: Payer: Self-pay | Admitting: Family Medicine

## 2015-08-22 ENCOUNTER — Ambulatory Visit (INDEPENDENT_AMBULATORY_CARE_PROVIDER_SITE_OTHER): Payer: No Typology Code available for payment source | Admitting: Family Medicine

## 2015-08-22 ENCOUNTER — Encounter: Payer: Self-pay | Admitting: Family Medicine

## 2015-08-22 VITALS — BP 148/73 | HR 68 | Temp 98.2°F | Resp 16 | Ht 65.5 in | Wt 310.0 lb

## 2015-08-22 DIAGNOSIS — R809 Proteinuria, unspecified: Secondary | ICD-10-CM

## 2015-08-22 DIAGNOSIS — I480 Paroxysmal atrial fibrillation: Secondary | ICD-10-CM

## 2015-08-22 DIAGNOSIS — E669 Obesity, unspecified: Secondary | ICD-10-CM

## 2015-08-22 DIAGNOSIS — E039 Hypothyroidism, unspecified: Secondary | ICD-10-CM

## 2015-08-22 DIAGNOSIS — E65 Localized adiposity: Secondary | ICD-10-CM

## 2015-08-22 DIAGNOSIS — R600 Localized edema: Secondary | ICD-10-CM

## 2015-08-22 DIAGNOSIS — L906 Striae atrophicae: Secondary | ICD-10-CM

## 2015-08-22 LAB — POCT URINALYSIS DIP (DEVICE)
Bilirubin Urine: NEGATIVE
Glucose, UA: NEGATIVE mg/dL
Hgb urine dipstick: NEGATIVE
Ketones, ur: NEGATIVE mg/dL
Leukocytes, UA: NEGATIVE
Nitrite: NEGATIVE
PH: 6 (ref 5.0–8.0)
PROTEIN: NEGATIVE mg/dL
UROBILINOGEN UA: 0.2 mg/dL (ref 0.0–1.0)

## 2015-08-22 NOTE — Progress Notes (Signed)
Subjective:    Patient ID: Desiree Schultz, female    DOB: 11/16/1970, 44 y.o.   MRN: [id number]  HPI  Desiree Schultz, a 44 year old female with a history of PCOS, hypothyroidism, atrial fibrillation, and depression presents complaining of lower extremity edema and dyspnea on exertion. Patient is complaining of fluid retention in lower extremities that has been present for greater than 2 months. Patient has been evaluated for proteinuria and has a appointment scheduled with nephrology in February 2017. Patient feels that edema and shortness of breath are worsening. The edema is present all day. The patient has not identified any provacative factors for swelling.   The swelling has been minimally relieved by OTC dietary supplements. Cardiac risk factors include dyslipidemia, microalbuminuria, obesity (BMI >= 30 kg/m2) and sedentary lifestyle. Past Medical History  Diagnosis Date  . Atrial fibrillation ([place])     2006  . Menorrhagia     2011  . Hypothyroid   . Prediabetes   . Depression   . PCOS (polycystic ovarian syndrome)   . OSA (obstructive sleep apnea)     on CPAP  . Obesity (BMI 30-39.9)   . Chronic anxiety   . Vitamin D deficiency   . Allergic rhinitis   . Hx of cardiovascular stress test     a. Myoview 10/13:  no ischemia; EF 57%  . Elevated blood pressure     On therapy. Probably hypertension  . Dysrhythmia   . Bronchial breathing   . Anemia   . PONV (postoperative nausea and vomiting)     C/o motion sickness  . Diabetes mellitus without complication [facility])    Social History   Social History  . Marital Status: Single    Spouse Name: N/A  . Number of Children: N/A  . 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 Smoking status: Former Smoker -- 2.00 packs/day for 16 years    Types: Cigarettes    Quit date: 09/03/1998  . Smokeless tobacco: Never Used  . Alcohol Use: Yes     Comment: maybe 2 a mth  .  Drug Use: Yes    Special: Marijuana     Comment: in high school -- none since  . Sexual Activity: Not on file   Other Topics Concern  . Not on file   Social History Narrative   Pt lives in [place] with friend.  Works as a Designer, jewellery   Allergies  Allergen Reactions  . Benadryl [Diphenhydramine Hcl]     itching  . Fish Oil     Makes hands raw, mouth and female area   . Influenza Vaccines     Gives pt the flu and site of injection swells and hot  . Latex     Burns skin  . Other Nausea Only    opiates  . Sulfur     Bladder spasms, mouth raw  . Tape     Burns skin, band-aid, ekg stickers     Review of Systems  Constitutional: Positive for unexpected weight change. Negative for fatigue.  HENT: Negative.   Eyes: Negative.   Respiratory: Positive for apnea.   Cardiovascular: Positive for leg swelling.  Endocrine: Negative.  Negative for polydipsia, polyphagia and polyuria.  Genitourinary: Negative.   Musculoskeletal: Negative.   Allergic/Immunologic: Negative.  Negative for immunocompromised state.  Neurological: Negative.   Hematological: Negative.   Psychiatric/Behavioral: Negative.  Depression controlled on Lexapro 20 mg daily       Objective:   Physical Exam  Constitutional: She is oriented to person, place, and time. She appears well-developed and well-nourished.  Morbid obesity  HENT:  Head: Normocephalic and atraumatic.  Right Ear: External ear normal.  Left Ear: External ear normal.  Mouth/Throat: Oropharynx is clear and moist.  Eyes: Conjunctivae and EOM are normal. Pupils are equal, round, and reactive to light.  Neck: Normal range of motion. Neck supple.  Cardiovascular: Normal rate, regular rhythm, normal heart sounds, intact distal pulses and normal pulses.  Exam reveals no decreased pulses.   Bilateral lower extremity edema (trace)  Pulmonary/Chest: Effort normal and breath sounds normal.  Abdominal: Soft. Bowel sounds are normal.  Increased  abdominal girth with striae (hypopigmented)  Musculoskeletal: Normal range of motion.  Neurological: She is alert and oriented to person, place, and time. She has normal reflexes.  Skin: Skin is warm, dry and intact.     Psychiatric: She has a normal mood and affect. Her behavior is normal. Judgment and thought content normal.         BP 148/73 mmHg  Pulse 68  Temp(Src) 98.2 F (36.8 C) (Oral)  Resp 16  Ht 5' 5.5" (1.664 m)  Wt 310 lb (140.615 kg)  BMI 50.78 kg/m2  LMP 08/19/2015 Assessment & Plan:  1. Bilateral edema of lower extremity Patient complaining of dyspnea and lower extremity edema for 2 months. She is requesting Lasix. Patient is taking OTC dietary supplements including algae, dandelion root, and vitamin E. She states that she is reading the Internet and finding supplements that are natural diuretics. I recommended that patient discontinue all medications that have not been prescribed by providers. Patient has adjusted prescribed medications and is self-diagnosing. I explained the danger of taking natural medications due to potential interactions. She states that she will not discontinue supplements until she is given a medication for the swelling. I explained to Desiree Schultz that that is not how a provider/patient relationship works. I also explained that I will continue to follow evidenced based guidelines when providing care.   I will check BNP, check electrolytes, and a 24 hour microalbumin prior to adding a diuretic. Reviewed previous echocardiogram, EF 55-60% and EKG at baseline.   I recommend that patient utilize compression stockings and elevate extremities to heart level while at rest.  - Brain natriuretic peptide - COMPLETE METABOLIC PANEL WITH GFR  2. Hypothyroidism, unspecified hypothyroidism type Reviewed labs, previous TSH is 0.283 on 07/25/2015. Will repeat TSH.  - TSH  3. Proteinuria of undiagnosed cause  - Microalbumin, urine, 24 hour; Future   4.  Paroxysmal atrial fibrillation [facility]) Desiree Schultz states that she discontinued Eliquis. She states that she did research and discontinued medication per Internet. Medication was prescribed by Dr. Caryl Comes, cardiologist. Recommend that she follow up with cardiologist. She is currently on aspirin therapy.   5. Buffalo hump Patient has an increase in fat deposits to back of neck. Will screen for excess cortisol production.  - Cortisol, urine, free - ACTH  6. Skin striae Patient has increased abdominal girth with striae, will screen for excess cortisol production (adrenal dysfunction).  - Cortisol, urine, free - ACTH  7. Obesity Recommend a lowfat, low carbohydrate diet divided over 5-6 small meals, increase water intake to 6-8 glasses, and 150 minutes per week of cardiovascular exercise.   RTC: Will follow up by phone after reviewing laboratory results. Follow up in 1 month for chronic  conditions. Will reviewed case with Dr. Ned Clines.   Dorena Dew, FNP

## 2015-08-22 NOTE — Patient Instructions (Signed)
Will follow up with Ms. Desiree Schultz by phone after reviewing laboratory results.

## 2015-08-23 ENCOUNTER — Other Ambulatory Visit: Payer: Self-pay | Admitting: Family Medicine

## 2015-08-23 DIAGNOSIS — E039 Hypothyroidism, unspecified: Secondary | ICD-10-CM

## 2015-08-23 LAB — COMPLETE METABOLIC PANEL WITH GFR
ALBUMIN: 3.7 g/dL (ref 3.6–5.1)
ALK PHOS: 45 U/L (ref 33–115)
ALT: 31 U/L — ABNORMAL HIGH (ref 6–29)
AST: 23 U/L (ref 10–35)
BUN: 12 mg/dL (ref 7–25)
CALCIUM: 8.9 mg/dL (ref 8.6–10.2)
CHLORIDE: 105 mmol/L (ref 98–110)
CO2: 23 mmol/L (ref 20–31)
Creat: 0.81 mg/dL (ref 0.50–1.10)
GFR, EST NON AFRICAN AMERICAN: 88 mL/min (ref >=60)
Glucose, Bld: 84 mg/dL (ref 65–99)
POTASSIUM: 4.3 mmol/L (ref 3.5–5.3)
Sodium: 139 mmol/L (ref 135–146)
Total Bilirubin: 0.2 mg/dL (ref 0.2–1.2)
Total Protein: 6.6 g/dL (ref 6.1–8.1)

## 2015-08-23 LAB — BRAIN NATRIURETIC PEPTIDE: Brain Natriuretic Peptide: 24.7 pg/mL (ref 0.0–100.0)

## 2015-08-23 LAB — TSH: TSH: 0.131 u[IU]/mL — AB (ref 0.350–4.500)

## 2015-08-23 MED ORDER — LEVOTHYROXINE SODIUM 150 MCG PO TABS: 150.0000 ug | ORAL_TABLET | Freq: Every day | ORAL | Status: DC

## 2015-08-24 ENCOUNTER — Other Ambulatory Visit: Payer: Self-pay | Admitting: Family Medicine

## 2015-08-24 DIAGNOSIS — E65 Localized adiposity: Secondary | ICD-10-CM

## 2015-08-25 LAB — MICROALBUMIN, URINE, 24 HOUR
MICROALB UR: 1.8 mg/dL
MICROALBUMIN, 24 HR URINE: 40 ug/min — AB (ref <20)
Microalb, 24H Ur: 58 mg/24 h — ABNORMAL HIGH (ref <30)

## 2015-08-31 ENCOUNTER — Telehealth: Payer: Self-pay | Admitting: Family Medicine

## 2015-08-31 NOTE — Telephone Encounter (Signed)
Spoke with patient she is asking about additional testing that was discussed during office visit. I have sent [place] a message regarding this. Thanks!

## 2015-08-31 NOTE — Telephone Encounter (Signed)
Patient called complaining about weakness.

## 2015-09-02 ENCOUNTER — Other Ambulatory Visit: Payer: Self-pay | Admitting: Family Medicine

## 2015-09-02 ENCOUNTER — Other Ambulatory Visit (INDEPENDENT_AMBULATORY_CARE_PROVIDER_SITE_OTHER): Payer: No Typology Code available for payment source

## 2015-09-02 DIAGNOSIS — E65 Localized adiposity: Secondary | ICD-10-CM

## 2015-09-02 DIAGNOSIS — E039 Hypothyroidism, unspecified: Secondary | ICD-10-CM

## 2015-09-03 LAB — TSH: TSH: 0.357 u[IU]/mL (ref 0.350–4.500)

## 2015-09-06 ENCOUNTER — Other Ambulatory Visit: Payer: Self-pay | Admitting: Family Medicine

## 2015-09-06 LAB — ACTH: C206 ACTH: 14 pg/mL (ref 6–50)

## 2015-09-06 MED ORDER — SPIRONOLACTONE 50 MG PO TABS: 25.0000 mg | ORAL_TABLET | Freq: Every day | ORAL | Status: DC

## 2015-09-06 MED FILL — SPIRONOLACTONE 50 MG TABLET: 50 | 30 days supply | Qty: 15 | Fill #0

## 2015-09-06 NOTE — Telephone Encounter (Signed)
This has been faxed into pharmacy. Thanks!~

## 2015-09-09 LAB — CORTISOL URINE FREE BY LC-MS/MS
CORTISOL, FREE RATIO TO CRT: 4.67 ug/g{creat}
Cortisol (Ur), Free: 5.84 ug/L
Creatinine,Ur: 125 mg/dL

## 2015-09-13 ENCOUNTER — Encounter: Payer: Self-pay | Admitting: Family Medicine

## 2015-09-20 ENCOUNTER — Other Ambulatory Visit: Payer: Self-pay | Admitting: Family Medicine

## 2015-09-20 DIAGNOSIS — R600 Localized edema: Secondary | ICD-10-CM

## 2015-09-20 MED ORDER — FUROSEMIDE 20 MG PO TABS: 20.0000 mg | ORAL_TABLET | Freq: Every day | ORAL | Status: DC

## 2015-09-20 MED FILL — METFORMIN HCL ER 500 MG TAB: 500 | 30 days supply | Qty: 60 | Fill #6

## 2015-09-20 MED FILL — METOPROLOL SUCC ER 25 MG TA: 25 | 30 days supply | Qty: 30 | Fill #1

## 2015-09-20 MED FILL — ?FUROSEMIDE 20 MG TABLET: 20 | 30 days supply | Qty: 30 | Fill #0

## 2015-09-20 NOTE — Progress Notes (Unsigned)
Reviewed case with Dr. Ned Clines. Will start a trial of Furosemide 20 mg for localized edema. Patient will follow up with Dr. Doreene Burke on 09/27/2015. Reviewed potassium, currently 4.3. Will check potassium in 1 month.   Meds ordered this encounter  Medications  . furosemide (LASIX) 20 MG tablet    Sig: Take 1 tablet (20 mg total) by mouth daily.    Dispense:  30 tablet    Refill:  0    Stevana Dufner M, FNP

## 2015-09-23 ENCOUNTER — Ambulatory Visit: Payer: No Typology Code available for payment source | Admitting: Family Medicine

## 2015-09-27 ENCOUNTER — Other Ambulatory Visit: Payer: Self-pay | Admitting: Internal Medicine

## 2015-09-27 ENCOUNTER — Encounter: Payer: Self-pay | Admitting: Internal Medicine

## 2015-09-27 ENCOUNTER — Other Ambulatory Visit: Payer: Self-pay | Admitting: Family Medicine

## 2015-09-27 ENCOUNTER — Ambulatory Visit (INDEPENDENT_AMBULATORY_CARE_PROVIDER_SITE_OTHER): Payer: No Typology Code available for payment source | Admitting: Internal Medicine

## 2015-09-27 VITALS — BP 130/72 | HR 62 | Temp 98.6°F | Resp 20 | Ht 64.0 in | Wt 318.0 lb

## 2015-09-27 DIAGNOSIS — R600 Localized edema: Secondary | ICD-10-CM

## 2015-09-27 DIAGNOSIS — E669 Obesity, unspecified: Secondary | ICD-10-CM

## 2015-09-27 DIAGNOSIS — R809 Proteinuria, unspecified: Secondary | ICD-10-CM | POA: Insufficient documentation

## 2015-09-27 LAB — GLUCOSE, CAPILLARY: Glucose-Capillary: 85 mg/dL (ref 65–99)

## 2015-09-27 MED ORDER — LISINOPRIL 5 MG PO TABS: 5.0000 mg | ORAL_TABLET | Freq: Every day | ORAL | Status: DC

## 2015-09-27 MED FILL — ?LEVOTHYROXINE 150 MCG TAB: 150 | 42 days supply | Qty: 42 | Fill #0

## 2015-09-27 MED FILL — ?ESCITALOPRAM 20 MG TABLET: 20 | 30 days supply | Qty: 30 | Fill #0

## 2015-09-27 MED FILL — LISINOPRIL 5 MG TABLET: 5 | 30 days supply | Qty: 30 | Fill #0

## 2015-09-27 NOTE — Progress Notes (Signed)
Patient here for Edema FU  Patient complains of bilateral swelling in feet, ankles, calfs. Being present for 4 months, scaled currently at a 5. Pain described as throbbing.  Patient CBG 85  Patient has not eaten today.

## 2015-09-27 NOTE — Patient Instructions (Signed)
Obesity Obesity is defined as having too much total body fat and a body mass index (BMI) of 30 or more. BMI is an estimate of body fat and is calculated from your height and weight. BMI is typically calculated by your health care provider during regular wellness visits. Obesity happens when you consume more calories than you can burn by exercising or performing daily physical tasks. Prolonged obesity can cause major illnesses or emergencies, such as:  Stroke.  Heart disease.  Diabetes.  Cancer.  Arthritis.  High blood pressure (hypertension).  High cholesterol.  Sleep apnea.  Erectile dysfunction.  Infertility problems. CAUSES   Regularly eating unhealthy foods.  Physical inactivity.  Certain disorders, such as an underactive thyroid (hypothyroidism), Cushing's syndrome, and polycystic ovarian syndrome.  Certain medicines, such as steroids, some depression medicines, and antipsychotics.  Genetics.  Lack of sleep. DIAGNOSIS A health care provider can diagnose obesity after calculating your BMI. Obesity will be diagnosed if your BMI is 30 or higher. There are other methods of measuring obesity levels. Some other methods include measuring your skinfold thickness, your waist circumference, and comparing your hip circumference to your waist circumference. TREATMENT  A healthy treatment program includes some or all of the following:  Long-term dietary changes.  Exercise and physical activity.  Behavioral and lifestyle changes.  Medicine only under the supervision of your health care provider. Medicines may help, but only if they are used with diet and exercise programs. If your BMI is 40 or higher, your health care provider may recommend specialized surgery or programs to help with weight loss. An unhealthy treatment program includes:  Fasting.  Fad diets.  Supplements and drugs. These choices do not succeed in long-term weight control. HOME CARE  INSTRUCTIONS  Exercise and perform physical activity as directed by your health care provider. To increase physical activity, try the following:  Use stairs instead of elevators.  Park farther away from store entrances.  Garden, bike, or walk instead of watching television or using the computer.  Eat healthy, low-calorie foods and drinks on a regular basis. Eat more fruits and vegetables. Use low-calorie cookbooks or take healthy cooking classes.  Limit fast food, sweets, and processed snack foods.  Eat smaller portions.  Keep a daily journal of everything you eat. There are many free websites to help you with this. It may be helpful to measure your foods so you can determine if you are eating the correct portion sizes.  Avoid drinking alcohol. Drink more water and drinks without calories.  Take vitamins and supplements only as recommended by your health care provider.  Weight-loss support groups, Tax adviser, counselors, and stress reduction education can also be very helpful. SEEK IMMEDIATE MEDICAL CARE IF:  You have chest pain or tightness.  You have trouble breathing or feel short of breath.  You have weakness or leg numbness.  You feel confused or have trouble talking.  You have sudden changes in your vision.   This information is not intended to replace advice given to you by your health care provider. Make sure you discuss any questions you have with your health care provider.   Document Released: 09/27/2004 Document Revised: 09/10/2014 Document Reviewed: 09/26/2011 Elsevier Interactive Patient Education 2016 [employer]. Proteinuria Proteinuria is a condition in which urine contains more protein than is normal. Proteinuria is either a sign that your body is producing too much protein or a sign that there is a problem with the kidneys. Healthy kidneys prevent most substances that the  body needs, including proteins, from leaving the bloodstream and ending up  in urine. CAUSES  Proteinuria may be caused by a temporary event or condition such as stress, exercise, or fever, and go away on its own. Proteinuria may also be a symptom of a more serious condition or disease. Causes of proteinuria include:  A kidney disease caused by:  Diabetes.  High blood pressure (hypertension).   A disease that affects the immune system, such as lupus.  A genetic disease, such as Alport's syndrome.  Medicines that damage the kidneys, such as long-term nonsteroidal anti-inflammatory drugs (NSAIDs).  Poisoning or exposure to toxic substances.  A reoccurring kidney or urinary infection.  Excess protein production in the body caused by:  Multiple myeloma.  Amyloidosis. SYMPTOMS You may have proteinuria without having noticeable symptoms. If there is a large amount of protein in your urine, your urine may look foamy. You may also notice swelling (edema) in your hands, feet, abdomen, or face. DIAGNOSIS To determine whether you have proteinuria, you will need to provide a urine sample. Your urine will then be tested for too much protein and the main blood protein albumin. If your test shows that you have proteinuria, you may need to take additional tests to determine its cause, how much protein is in your urine, and what type of protein is being lost. Tests may include:  Blood tests.  Urine tests.  A blood pressure measurement.  Imaging tests. TREATMENT  Treatment will depend on the cause of your proteinuria. Your caregiver will discuss treatment options with you after you have been diagnosed. If your proteinuria is mild or temporary, no treatment may be necessary. HOME CARE INSTRUCTIONS Ask your caregiver if monitoring the level of protein in your urine at home using simple testing strips is appropriate for you. Early detection of proteinuria can lead to early and often successful treatment of the condition causing it.   This information is not intended  to replace advice given to you by your health care provider. Make sure you discuss any questions you have with your health care provider.   Document Released: 10/10/2005 Document Revised: 05/14/2012 Document Reviewed: 01/18/2012 Elsevier Interactive Patient Education [employer].

## 2015-09-27 NOTE — Progress Notes (Signed)
Patient ID: Desiree Schultz, female   DOB: March 12, 1971, 45 y.o.   MRN: [id number]   Desiree Schultz, is a 45 y.o. female  [id number]  [id number]  DOB - Apr 13, 1971  Chief Complaint  Patient presents with  . Follow-up    Edema        Subjective:   Desiree Schultz is a 45 y.o. female with extensive medical history including paroxysmal atrial fibrillation rate controlled on beta blocker, PCOS, diabetes, morbid obesity, chronic anxiety, and obstructive sleep apnea on CPAP machine here today for a follow up visit. She was recently seen here in our clinic, had undergone extensive workup including for Cushing's syndrome, mostly negative. Patient was noticed to have some pedal edema, was started on diuretics, slowly getting better. 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 Her current medications are listed below. Her major concern today is ongoing pedal edema, she currently takes 20 mg tablet Lasix per day and 25 mg tablet spironolactone. She does not have any shortness of breath, no cough, no chest pain, no blurry vision. She has a nephrology appointment coming up in February. Patient has No headache, No abdominal pain - No Nausea, No new weakness tingling or numbness. She has no family history of nephrotic syndrome, or any kidney disease. She is sexually active with same female sexual partner for over 13 years.  Problem  Bilateral Edema of Lower Extremity  Proteinuria of Undiagnosed Cause    ALLERGIES: Allergies  Allergen Reactions  . Benadryl [Diphenhydramine Hcl]     itching  . Fish Oil     Makes hands raw, mouth and female area    . Influenza Vaccines     Gives pt the flu and site of injection swells and hot  . Latex     Burns skin  . Other Nausea Only    opiates  . Sulfur     Bladder spasms, mouth raw  . Tape     Burns skin, band-aid, ekg stickers    PAST MEDICAL HISTORY: Past Medical History  Diagnosis Date  . Atrial fibrillation ([place])     2006  . Menorrhagia     2011  . Hypothyroid   . Prediabetes   . Depression   . PCOS (polycystic ovarian syndrome)   . OSA (obstructive sleep apnea)     on CPAP  . Obesity (BMI 30-39.9)   . Chronic anxiety   . Vitamin D deficiency   . Allergic rhinitis   . Hx of cardiovascular stress test     a. Myoview 10/13:  no ischemia; EF 57%  . Elevated blood pressure     On therapy. Probably hypertension  . Dysrhythmia   . Bronchial breathing   . Anemia   . PONV (postoperative nausea and vomiting)     C/o motion sickness  . Diabetes mellitus without complication ([place])     MEDICATIONS AT HOME: Prior to Admission medications   Medication Sig Start Date End Date Taking? Authorizing Provider  Ascorbic Acid (VITAMIN C WITH ROSE HIPS) 1000 MG tablet Take 2 mg by mouth daily.   Yes Historical Provider, MD  aspirin 81  MG tablet Take 81 mg by mouth daily.   Yes Historical Provider, MD  cetirizine (ZYRTEC) 10 MG tablet Take 10 mg by mouth daily.     Yes Historical Provider, MD  dronedarone (MULTAQ) 400 MG tablet Take 1 tablet (400 mg total) by mouth 2 (two) times daily with a meal. 07/08/15  Yes Yona Stansbury E Doreene Burke, MD  escitalopram (LEXAPRO) 20 MG tablet Take 1 tablet (20 mg total) by mouth daily. 03/16/15  Yes Micheline Chapman, NP  furosemide (LASIX) 20 MG tablet Take 1 tablet (20 mg total) by mouth daily. 09/20/15  Yes Dorena Dew, FNP  levothyroxine (SYNTHROID, LEVOTHROID) 150 MCG tablet Take 1 tablet (150 mcg total) by mouth daily before breakfast. 08/23/15  Yes Dorena Dew, FNP  metFORMIN (GLUMETZA) 1000 MG (MOD) 24 hr tablet Take 1 tablet (1,000 mg total) by  mouth daily. 01/06/15  Yes Leana Gamer, MD  metoprolol succinate (TOPROL-XL) 25 MG 24 hr tablet Take 25 mg by mouth daily.   Yes Historical Provider, MD  [place] [employer] POWD by Does not apply route.    Historical Provider, MD  albuterol (PROVENTIL HFA;VENTOLIN HFA) 108 (90 BASE) MCG/ACT inhaler Inhale 2 puffs into the lungs every 6 (six) hours as needed for wheezing or shortness of breath. Patient not taking: Reported on 08/22/2015 03/16/15   Micheline Chapman, NP  cholecalciferol (VITAMIN D) 400 UNITS TABS tablet Take 400 Units by mouth daily. Reported on 09/27/2015    Historical Provider, MD  HAWTHORN BERRIES PO Take 2,100 mg by mouth. Reported on 09/27/2015    Historical Provider, MD  lisinopril (PRINIVIL,ZESTRIL) 5 MG tablet Take 1 tablet (5 mg total) by mouth daily. 09/27/15   Tresa Garter, MD  Misc Natural Products (DANDELION ROOT PO) Take 3,500 mg by mouth. Reported on 09/27/2015    Historical Provider, MD  Multiple Vitamin (MULTIVITAMIN) capsule Take 1 capsule by mouth daily. Reported on 09/27/2015    Historical Provider, MD  spironolactone (ALDACTONE) 50 MG tablet Take 0.5 tablets (25 mg total) by mouth daily. Patient not taking: Reported on 09/27/2015 09/06/15   Dorena Dew, FNP  vitamin E 400 UNIT capsule Take 800 Units by mouth daily.    Historical Provider, MD  VITAMIN K PO Take 1 tablet by mouth daily. Reported on 09/27/2015    Historical Provider, MD     Objective:   Filed Vitals:   09/27/15 0821  BP: 130/72  Pulse: 62  Temp: 98.6 F (37 C)  TempSrc: Oral  Resp: 20  Height: 5\' 4"  (1.626 m)  Weight: 318 lb (144.244 kg)  SpO2: 97%    Exam General appearance : Awake, alert, not in any distress. Speech Clear. Not toxic looking, Morbidly Obese HEENT: Atraumatic and Normocephalic, pupils equally reactive to light and accomodation Neck: Buffalo's hump, supple, no JVD. No cervical lymphadenopathy.  Chest: Good air entry bilaterally, no added sounds  CVS: S1 S2  regular, no murmurs.  Abdomen: Bowel sounds present, Non tender and not distended with no gaurding, rigidity or rebound. Extremities: B/L Lower Ext shows very minimal edema, both legs are warm to touch Neurology: Awake alert, and oriented X 3, CN II-XII intact, Non focal Skin: No Rash  Data Review Lab Results  Component Value Date   HGBA1C 5.9* 04/05/2015   HGBA1C 6.1* 12/30/2014   HGBA1C 5.8* 10/05/2014     Assessment & Plan   1. Bilateral edema of lower extremity: Mild  - Protein, urine, random - Protein /  creatinine index, urine - Albumin - US Renal; Future - Lipid panel - POCT glycosylated hemoglobin (Hb A1C)  2. Proteinuria of undiagnosed cause With proteinuria, hypertriglyceridemia, pedal edema and possible low serum albumin, there is possibility of nephrotic syndrome. Will reevaluate patient with the following tests and start on low-dose ACE inhibitor, continue current diuretic, will probably stop spironolactone and continue Lasix. Follow-up with nephrologist as scheduled.  - Protein, urine, random - Protein / creatinine index, urine - Albumin - US Renal; Future - Lipid panel - POCT glycosylated hemoglobin (Hb A1C) - HIV screening  Start - lisinopril (PRINIVIL,ZESTRIL) 5 MG tablet; Take 1 tablet (5 mg total) by mouth daily.  Dispense: 90 tablet; Refill: 3  3. Obesity  - Lipid panel  Aim for 30 minutes of exercise most days. Rethink what you drink. Water is great! Aim for 2-3 Carb Choices per meal (30-45 grams) +/- 1 either way  Aim for 0-15 Carbs per snack if hungry  Include protein in moderation with your meals and snacks  Consider reading food labels for Total Carbohydrate and Fat Grams of foods  Be mindful about how much sugar you are adding to beverages and other foods. Fruit Punch - find one with no sugar  Measure and decrease portions of carbohydrate foods  Make your plate and don't go back for seconds  Patient have been counseled extensively about  nutrition and exercise  Return in about 3 months (around 12/26/2015) for Proteinuria.  The patient was given clear instructions to go to ER or return to medical center if symptoms don't improve, worsen or new problems develop. The patient verbalized understanding. The patient was told to call to get lab results if they haven't heard anything in the next week.   This note has been created with Surveyor, quantity. Any transcriptional errors are unintentional.    Angelica Chessman, MD, [place], Karilyn Cota, [place] [facility] [place], [place]   09/27/2015, 9:36 AM

## 2015-09-28 LAB — LIPID PANEL
CHOL/HDL RATIO: 5.6 ratio — AB (ref <=5.0)
CHOLESTEROL: 169 mg/dL (ref 125–200)
HDL: 30 mg/dL — ABNORMAL LOW (ref >=46)
LDL Cholesterol: 102 mg/dL (ref <130)
Triglycerides: 187 mg/dL — ABNORMAL HIGH (ref <150)
VLDL: 37 mg/dL — ABNORMAL HIGH (ref <30)

## 2015-09-28 LAB — PROTEIN, URINE, RANDOM

## 2015-09-28 LAB — HIV ANTIBODY (ROUTINE TESTING W REFLEX): HIV 1&2 Ab, 4th Generation: NONREACTIVE

## 2015-09-28 LAB — PROTEIN / CREATININE INDEX, URINE

## 2015-09-28 LAB — ALBUMIN: ALBUMIN: 3.8 g/dL (ref 3.6–5.1)

## 2015-09-30 ENCOUNTER — Ambulatory Visit (HOSPITAL_COMMUNITY)
Admission: RE | Admit: 2015-09-30 | Discharge: 2015-09-30 | Disposition: A | Discharge status: Home / Self Care | Payer: No Typology Code available for payment source | Source: Ambulatory Visit | Attending: Internal Medicine | Admitting: Internal Medicine

## 2015-09-30 DIAGNOSIS — R809 Proteinuria, unspecified: Secondary | ICD-10-CM

## 2015-09-30 DIAGNOSIS — R600 Localized edema: Secondary | ICD-10-CM

## 2015-09-30 LAB — PROTEIN / CREATININE INDEX, URINE: PROTEIN/CREAT RATIO: 95 (ref 0–125)

## 2015-09-30 LAB — PROTEIN, URINE, RANDOM: TOTAL PROTEIN, URINE: 6 mg/dL (ref 5–24)

## 2015-09-30 LAB — CREATININE, URINE, RANDOM: CREATININE, URINE: 71 mg/dL (ref 20–320)

## 2015-10-04 ENCOUNTER — Telehealth: Payer: Self-pay | Admitting: *Deleted

## 2015-10-04 NOTE — Telephone Encounter (Signed)
-----   Message from Tresa Garter, MD sent at 09/29/2015  6:57 PM EST ----- Please inform patient of normal labs so far. Triglyceride has come down to 187 from 354 2 months ago. HIV negative as of 09/27/2015.

## 2015-10-04 NOTE — Telephone Encounter (Signed)
Medical Assistant left message on patient's home and cell voicemail. Voicemail states to give a call back to Ganon Demasi with [facility] at [phone].  

## 2015-10-05 ENCOUNTER — Telehealth: Payer: Self-pay | Admitting: Internal Medicine

## 2015-10-05 NOTE — Telephone Encounter (Signed)
Called and left a message advising patient of labs and to call back if any questions. Thanks!

## 2015-10-05 NOTE — Telephone Encounter (Signed)
Please call about labs

## 2015-10-06 MED FILL — SPIRONOLACTONE 50 MG TABLET: 50 | 30 days supply | Qty: 15 | Fill #1

## 2015-10-10 ENCOUNTER — Ambulatory Visit: Payer: No Typology Code available for payment source | Admitting: Family Medicine

## 2015-10-11 ENCOUNTER — Ambulatory Visit: Payer: No Typology Code available for payment source | Admitting: Internal Medicine

## 2015-10-11 ENCOUNTER — Telehealth: Payer: Self-pay | Admitting: *Deleted

## 2015-10-11 NOTE — Telephone Encounter (Signed)
Medical Assistant left message on patient's home and cell voicemail. Voicemail states to give a call back to Dyon Rotert with [facility] at [phone].  

## 2015-10-11 NOTE — Telephone Encounter (Signed)
Patient verified DOB Patient was made aware of these results during another encounter for lab results.

## 2015-10-11 NOTE — Telephone Encounter (Signed)
Patient verified DOB Patient made aware of urine protein ratio being normal. Patient also aware of Triglycerides being better than 2 months ago. Patient expressed her understanding and had no further questions.

## 2015-10-11 NOTE — Telephone Encounter (Signed)
-----   Message from Tresa Garter, MD sent at 10/04/2015  4:27 PM EST ----- Please inform patient of normal urine protein and protein creatinine ratio.

## 2015-10-18 ENCOUNTER — Other Ambulatory Visit: Payer: Self-pay | Admitting: Internal Medicine

## 2015-10-18 MED ORDER — FUROSEMIDE 20 MG PO TABS: 20.0000 mg | ORAL_TABLET | Freq: Every day | ORAL | Status: DC

## 2015-10-18 NOTE — Telephone Encounter (Signed)
Refill sent into pharmacy. Thanks!  

## 2015-10-19 MED FILL — ?FUROSEMIDE 20 MG TABLET: 20 | 30 days supply | Qty: 30 | Fill #0

## 2015-10-20 ENCOUNTER — Ambulatory Visit (INDEPENDENT_AMBULATORY_CARE_PROVIDER_SITE_OTHER): Payer: Self-pay | Admitting: Licensed Clinical Social Worker

## 2015-10-20 DIAGNOSIS — F32A Depression, unspecified: Secondary | ICD-10-CM

## 2015-10-20 DIAGNOSIS — F329 Major depressive disorder, single episode, unspecified: Secondary | ICD-10-CM

## 2015-10-20 MED FILL — ?ESCITALOPRAM 20 MG TABLET: 20 | 30 days supply | Qty: 30 | Fill #1

## 2015-10-20 MED FILL — $MULTAQ 400 MG TABLET: 400 | 30 days supply | Qty: 60 | Fill #2

## 2015-10-20 MED FILL — METFORMIN HCL ER 500 MG TAB: 500 | 30 days supply | Qty: 60 | Fill #0

## 2015-10-20 MED FILL — METOPROLOL SUCC ER 25 MG TA: 25 | 30 days supply | Qty: 30 | Fill #2

## 2015-10-20 MED FILL — LISINOPRIL 5 MG TABLET: 5 | 30 days supply | Qty: 30 | Fill #1

## 2015-10-20 NOTE — Progress Notes (Signed)
   THERAPY PROGRESS NOTE  Session Time: 60 mins  Participation Level: Active  Behavioral Response: CasualAlertEuthymic  Type of Therapy: Individual Therapy  Treatment Goals addressed: Coping  Interventions: Supportive  Summary: Desiree Schultz is a 45 y.o. female who presents with a euthymic mood and appropriate affect. Desiree Schultz reports seeking counseling because of stress in her relationship with her partner of 13 years, Desiree Schultz. Desiree Schultz reports that she and Desiree Schultz sort of broke up and are still living together because Desiree Schultz is financially dependent on Desiree Schultz since Desiree Schultz is a full time student and does not have time to work. Desiree Schultz reports that she still cares about Desiree Schultz, but that she is tired of taking care of her emotionally and physically Desiree Schultz has some significant health issues) with no reciprocation. Desiree Schultz reports frustration and sadness around the lack of sex in the relationship and reports that Desiree Schultz thinks she is too fat. Desiree Schultz reports that Desiree Schultz is bipolar and that she has always "cleaned up her messes." Desiree Schultz wants to move on but feels like she can't until she graduates in December of next year since she has no income and reports that Desiree Schultz is fine with their current arrangement. Desiree Schultz reports that her mother had significant relationship issues and was married 3 times, all to workaholics. Desiree Schultz reports that Desiree Schultz is a workaholic and does not have time for her. Desiree Schultz reports wanting to work on building her confidence, managing her stress and figuring out how to move on from her current relationship. Desiree Schultz reports being interested in longer sessions- 90 minutes.   Suicidal/Homicidal: Nowithout intent/plan  Therapist Response: Social Work Intern ([place]) utilized active listening and empathetic reflections to encourage Desiree Schultz to share her feelings and frustrations. [name] praised Desiree Schultz for making the effort to come in for counseling and to take care of herself emotionally by seeking out counseling.  [name] helped Desiree Schultz think through her options for ending her relationship and think about other options for living situations. [name] began clinical assessment.   Plan: Return again in 1 weeks.  Diagnosis: Axis I: NA    Axis II: NA    [name], Student-SW 10/20/2015

## 2015-10-26 ENCOUNTER — Other Ambulatory Visit: Payer: Self-pay | Admitting: Internal Medicine

## 2015-10-27 ENCOUNTER — Ambulatory Visit (INDEPENDENT_AMBULATORY_CARE_PROVIDER_SITE_OTHER): Payer: Self-pay | Admitting: Licensed Clinical Social Worker

## 2015-10-27 DIAGNOSIS — F32A Depression, unspecified: Secondary | ICD-10-CM

## 2015-10-27 DIAGNOSIS — F329 Major depressive disorder, single episode, unspecified: Secondary | ICD-10-CM

## 2015-10-28 NOTE — Progress Notes (Signed)
Biopsychosocial Assessment Note  Desiree Schultz 45 y.o. 10/28/2015   Referred by: self-referral  PRESENTING PROBLEM Chief Complaint: Relationship problems What are the main stressors in your life right now?Marital Stress   2  Describe a brief history of your present symptoms: [name] has been with her partner, Desiree Schultz for 14 years and for the past 6 months they have had significant relationship problems. Ariel states that Desiree Schultz is no longer interested in her sexually and does not meet her emotional needs. Deetra claims Desiree Schultz supports her financially but that she does not meet any of her other needs despite Desiree Schultz taking care of Desiree Schultz emotionally for the past 14 years. [name] is seeking counseling to figure how to deal with the stress of the relationship while she tries to decide whether to end the relationship or not.   How long have you had these symptoms?: 6 months What effect have they had on your life?: the relationship stress causes Desiree Schultz to feel stressed and depressed.    FAMILY ASSESSMENT Was the significant other/family member interviewed? NA If No, why?: NA Is significant other/family member supportive? NA Did significant other/family member express concerns for the patient? NA If Yes, describe: NA  Is significant other/family member willing to be part of treatment plan? NA Describe significant other/family member's perception of patient's illness: NA  Describe significant other/family member's perception of expectations with treatment: NA  [name] Have you ever been treated for a mental health problem? Yes  If Yes, when? Starting about 20 years ago until now , where? Various , by whom? Various doctors  Are you currently seeing a therapist or counselor? No If Yes, whom?NA Have you ever had a mental health hospitalization? No If Yes, when? NA , where? NA, why?NA, how many times? NA Have you ever had suicidal thoughts or attempted suicide? No If Yes, when? NA   Describe NA  Have you ever been treated with medication for a mental health problem? Yes If Yes, please list as completely as possible (name of medication, reason prescribed, and response: Lexapro, 20/mg 1x/day, prescribed several years ago.   [name] Is there any history of mental health problems or substance abuse in your family? Yes If Yes, please explain (include information on parents, siblings, aunts/uncles, grandparents, cousins, etc.): both parents and both sets of grandparents have a history of alcoholism. Has anyone in your family been hospitalized for mental health problems? No If Yes, please explain (including who, where, and for what length of time):NA   MARITAL STATUS Are you presently: In a long-term relationship How many times have you been married? none Dates of previous marriages: NA Do you have any concerns regarding marriage? Yes If Yes, please explain: [name] is having relationship problems with her partner of 14 years, Desiree Schultz you have any children? No If Yes, how many? NA Please list their sexes and ages: NA   LEISURE/RECREATION Describe how patient spends leisure time: [name] likes to bike and go fishing.   SOCIAL AND FAMILY HISTORY Who lives in your current household? Desiree Schultz, Desiree Schultz's partner, Desiree Schultz and their dog, [name] Where were you born? [place], [place] Where did you grow up? [place], [place] Describe the household where you grew up: Desiree Schultz lived with her Mom, who she is close to and has a good relationship with and her dad, then 2 stepfathers. Desiree Schultz describes a loving family that struggled with some alcoholism but there was never abuse of any kind.   Do you  have siblings, step/half siblings? Yes If Yes, please list names, sex and ages:  full brother, female, 17 Sister, female, 20 Half-brother, female, 99  Are your parents still living? Yes If No, what was the cause of death? NA If Yes, father's age: client did not say   His health: good If  Yes, mother's age: client did not say Her health: good Where do your parents live? [place], [place] Do you see them often? Yes If No, why not?NA  Are your parents separated/divorced? Yes If Yes, approximately when? Over 20 years ago  Have you ever been exposed to any form of abuse? No If Yes: NA Did the abuse happen recently, or in the past? NA Were you the victim or offender, please explain: NA  Are you having problems with any member or your family? No If Yes, please explain:NA  What Religion are you? Does not identify as religious Do you have any cultural or religious beliefs which could impact your treatment? No If Yes, please explain (including customs, celebrations, attitude towards alcohol and drugs, authority in family, etc):  NA  Have you ever been in the [employer]? No If Yes, when? NA for how long? NA Were you ever in active combat? No If Yes, when? NA for how long? NA Were there any lasting effects on you? NA If Yes, please explain:NA  Why did you leave the [employer] (include type of discharge, disciplinary action, substance abuse, or any Post Traumatic Stress Symptoms): NA  Do you have any legal problems/involvements? No If Yes, please explain: NA   EDUCATIONAL BACKGROUND How many grades have you completed? technical college Do you hold any Degrees? Yes If Yes, in what? A.A. In IT From where? unknown What were your special talents/interests in school? Math, science, reading  Did you have any problems in school? No If Yes, were these problems behavioral, attentional, or due to learning difficulties? NA Were any medications ever prescribed for these problems? NA If Yes, what were the medications, including the dosage, how long you took these and who prescribed them? 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 NA  Name of employer: NA Do you enjoy your present job?  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 No If Yes, please explain: NA  Does your spouse work? Yes If Yes, where and for how long? In an IT field, over 20 years Are you under financial stress? 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 Myoshi abused alcohol and occasionally cocaine from ages [age] (alcohol, the cocaine use was only in her 49's). Tasheba was exposed to alcohol as medicine when she was a baby.   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 Depression dx comes from another doctor as evidenced by Britani's antidepressant Rx.  [name], Student-SW 10/28/2015

## 2015-11-03 ENCOUNTER — Other Ambulatory Visit: Payer: No Typology Code available for payment source | Admitting: Licensed Clinical Social Worker

## 2015-11-10 ENCOUNTER — Ambulatory Visit (INDEPENDENT_AMBULATORY_CARE_PROVIDER_SITE_OTHER): Payer: Self-pay | Admitting: Licensed Clinical Social Worker

## 2015-11-10 DIAGNOSIS — F329 Major depressive disorder, single episode, unspecified: Secondary | ICD-10-CM

## 2015-11-10 DIAGNOSIS — F32A Depression, unspecified: Secondary | ICD-10-CM

## 2015-11-10 MED FILL — SPIRONOLACTONE 50 MG TABLET: 50 | 30 days supply | Qty: 15 | Fill #2

## 2015-11-11 ENCOUNTER — Other Ambulatory Visit: Payer: Self-pay | Admitting: Family Medicine

## 2015-11-11 MED FILL — METFORMIN HCL ER 500 MG TAB: 500 | 30 days supply | Qty: 60 | Fill #1

## 2015-11-11 MED FILL — METOPROLOL SUCC ER 25 MG TA: 25 | 30 days supply | Qty: 30 | Fill #3

## 2015-11-11 MED FILL — ?ESCITALOPRAM 20 MG TABLET: 20 | 30 days supply | Qty: 30 | Fill #2

## 2015-11-11 MED FILL — ?LEVOTHYROXINE 150 MCG TAB: 150 | 30 days supply | Qty: 30 | Fill #0

## 2015-11-11 MED FILL — LISINOPRIL 5 MG TABLET: 5 | 30 days supply | Qty: 30 | Fill #2

## 2015-11-11 MED FILL — **MULTAQ 400 MG TABLET: 400 MG | 30 days supply | Qty: 60 | Fill #3

## 2015-11-13 NOTE — Progress Notes (Signed)
   THERAPY PROGRESS NOTE  Session Time:60 mins  Participation Level: Active  Behavioral Response: CasualAlertEuthymic  Type of Therapy: Individual Therapy  Treatment Goals addressed: Coping  Interventions: Supportive  Summary: Desiree Schultz is a 45 y.o. female who presents with a euthymic mood and appropriate affect. Desiree Schultz reports sadness that her relationship of 14 years with her partner, Desiree Schultz, is over. Desiree Schultz and [name] still live together, and Desiree Schultz reports that she is actively looking for someone else to have a sexual relationship with, even though she not mentioned it to Desiree Schultz since Desiree Schultz would be devastated even though they have broken up.  [name] reports that she wants to work on moving on and mourning her relationship in therapy. Desiree Schultz reports that she and Desiree Schultz still act like best friends, but that the relationship is over. 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 Desiree Schultz reports jealousy that some other woman will "benefit from her hard work" once Desiree Schultz starts dating again when Desiree Schultz moves out. Desiree Schultz hopes to move out shortly after graduation as soon as she has a job. She wants to start over in her own apartment and come and go as she pleases and be able to have casual relationships.   Suicidal/Homicidal: Nowithout intent/plan  Therapist Response: Social Work Intern (Desiree Schultz) utilized active listening and empathetic reflections to encourage Desiree Schultz to share her feelings. [name] helped Desiree Schultz explore what might happen if her ex found out she was sleeping with someone else while they still lived together, but [name] was sure she would not be found out and is determined to get her needs met. [name] acknowleged that [name] did deserve to have her needs met, but encouraged Desiree Schultz to be cautious in her current situation. [name] acknowledged that leaving a partner is hard, and that it is ok to be  upset and jealous while also wanting to move on. [name] encouraged Desiree Schultz to imagine and describe what her life would look like when she moved out of Desiree Schultz's house, and [name] described living in a small apartment by herself, doing whatever she wanted while hanging out with her dog, Desiree Schultz. [name] encouraged Desiree Schultz to explore what she would need to do to make this plan happen, and will discuss it in next session. [name] explained CBT to Desiree Schultz since [name] is interested in utilizing that type of therapy. Desiree Schultz indicated that she wanted to begin CBT.   Plan: Return again in 1 weeks.  Diagnosis: Axis I: pending    Axis II: pending    Desiree Schultz, Student-SW 11/13/2015

## 2015-11-16 ENCOUNTER — Ambulatory Visit (INDEPENDENT_AMBULATORY_CARE_PROVIDER_SITE_OTHER): Payer: Self-pay | Admitting: Licensed Clinical Social Worker

## 2015-11-16 ENCOUNTER — Telehealth: Payer: Self-pay | Admitting: *Deleted

## 2015-11-16 DIAGNOSIS — F329 Major depressive disorder, single episode, unspecified: Secondary | ICD-10-CM

## 2015-11-16 DIAGNOSIS — F32A Depression, unspecified: Secondary | ICD-10-CM

## 2015-11-16 MED ORDER — FUROSEMIDE 20 MG PO TABS: 20.0000 mg | ORAL_TABLET | Freq: Every day | ORAL | Status: DC

## 2015-11-16 NOTE — Telephone Encounter (Signed)
Pt called and requesting a refill of her lasix. [facility]. Please advise provider. Thanks
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
## 2015-11-16 NOTE — Telephone Encounter (Signed)
[facility] Thanks!

## 2015-11-17 MED FILL — FUROSEMIDE 20 MG TABLET: 20 | 30 days supply | Qty: 30 | Fill #0

## 2015-11-24 ENCOUNTER — Ambulatory Visit (INDEPENDENT_AMBULATORY_CARE_PROVIDER_SITE_OTHER): Payer: Self-pay | Admitting: Licensed Clinical Social Worker

## 2015-11-24 DIAGNOSIS — F325 Major depressive disorder, single episode, in full remission: Secondary | ICD-10-CM

## 2015-11-26 NOTE — Progress Notes (Signed)
   THERAPY PROGRESS NOTE  Session Time: 90 mins  Participation Level: Active  Behavioral Response: CasualAlertEuthymic  Type of Therapy: Individual Therapy  Treatment Goals addressed: Coping  Interventions: Solution Focused  Summary: Desiree Schultz is a 45 y.o. female who presents with a euthymic mood and appropriate affect. [name] reports being nervous about whether she is really ready to move on from her partner despite being sure she was ready last week. [name] is moving forward in with creating her own bedroom in the home she shares with her partner. She says her partner has not reacted to this plan and it makes her sad. [name] is worried about the grief and uncertainty she anticipates feeling when she moves out of her partner's home when she graduates in December. [name] worries about the things she has done in the relationship that may have contributed to the breakup despite feeling like she has always cared for her partner to her own detriment. [name] wants to work on finding a way to function while still living with her partner even though she thinks they are broken up. [name] reports wanting to focus on her goals despite her grief and uncertainty.    Suicidal/Homicidal: Nowithout intent/plan  Therapist Response: Social Work Intern ([place]) utilized active listening to encourage [name] to share her thoughts and feelings and thoughts. [name] encouraged [name] to develop a plan to focus on her goals while still dealing with her grief and uncertainty. [name] acknowledged it is ok to feel grief and uncertainty in ending a relationship. [name] helped [name] walk through the pros and cons of leaving her current partner as well as when to leave her partner if she decided to. [name] asked [name] about times she was happy in the relationship and times when she was not and how she felt  And compare that to how she feels in the relationship now.  [name] supported [name] in her decision to create her own space in the home she shares with  her partner to begin the process of possibly separating from her partner. [name] shared that she often won't talk to her partner as a way of "testing" her to see if her partner will bring up an issue, and [name] pointed out the problematic communication pattern there and discusses some ways [name] could bring up things directly with her partner.   Plan: Return again in 1 weeks.  Diagnosis: Axis I: See current hospital problem list    Axis II: No diagnosis    [name], Student-SW 11/26/2015

## 2015-12-01 ENCOUNTER — Ambulatory Visit (INDEPENDENT_AMBULATORY_CARE_PROVIDER_SITE_OTHER): Payer: Self-pay | Admitting: Licensed Clinical Social Worker

## 2015-12-01 DIAGNOSIS — F325 Major depressive disorder, single episode, in full remission: Secondary | ICD-10-CM
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
## 2015-12-08 ENCOUNTER — Ambulatory Visit (INDEPENDENT_AMBULATORY_CARE_PROVIDER_SITE_OTHER): Payer: Self-pay | Admitting: Licensed Clinical Social Worker

## 2015-12-08 DIAGNOSIS — F325 Major depressive disorder, single episode, in full remission: Secondary | ICD-10-CM

## 2015-12-08 MED FILL — SPIRONOLACTONE 50 MG TABLET: 50 | 30 days supply | Qty: 15 | Fill #3

## 2015-12-08 MED FILL — LEVOTHYROXINE 150 MCG TAB: 150 | 30 days supply | Qty: 30 | Fill #1
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
## 2015-12-15 ENCOUNTER — Other Ambulatory Visit: Payer: No Typology Code available for payment source | Admitting: Licensed Clinical Social Worker

## 2015-12-22 ENCOUNTER — Telehealth: Payer: Self-pay

## 2015-12-22 ENCOUNTER — Other Ambulatory Visit: Payer: Self-pay

## 2015-12-22 ENCOUNTER — Ambulatory Visit (INDEPENDENT_AMBULATORY_CARE_PROVIDER_SITE_OTHER): Payer: Self-pay | Admitting: Licensed Clinical Social Worker

## 2015-12-22 DIAGNOSIS — F325 Major depressive disorder, single episode, in full remission: Secondary | ICD-10-CM

## 2015-12-22 MED ORDER — FUROSEMIDE 20 MG PO TABS: 20.0000 mg | ORAL_TABLET | Freq: Every day | ORAL | Status: DC

## 2015-12-22 MED FILL — METFORMIN HCL ER 500 MG TAB: 500 | 30 days supply | Qty: 60 | Fill #2

## 2015-12-22 MED FILL — ?ESCITALOPRAM 20 MG TABLET: 20 | 30 days supply | Qty: 30 | Fill #3

## 2015-12-22 MED FILL — LISINOPRIL 5 MG TABLET: 5 | 30 days supply | Qty: 30 | Fill #3

## 2015-12-22 MED FILL — FUROSEMIDE 20 MG TABLET: 20 | 30 days supply | Qty: 30 | Fill #0

## 2015-12-22 NOTE — Telephone Encounter (Signed)
Pt is requesting a medication refill on medications Metoprolol and Furosemide. She also would like for the nurse to check behind her and see if there are any others that need to be refilled. She would like the refills sent to [facility]. Thanks!

## 2015-12-23 NOTE — Progress Notes (Signed)
   THERAPY PROGRESS NOTE  Session Time: 75 mins  Participation Level: Active  Behavioral Response: CasualAlertEuthymic  Type of Therapy: Individual Therapy  Treatment Goals addressed: Coping  Interventions: CBT, Motivational Interviewing and Supportive  Summary: EH MANERS is a 45 y.o. female who presents with a euthymic mood and appropriate affect. At the beginning of the session, Social Work Intern ([place]) and Moniesha talked about termination and discussed that they were both sad for the therapeutic relationship to end.  Siiri reports that her partner is finally seeking therapy and working through her trauma and now Darnesha feels very conflicted about whether she should leave her partner, even though she has been pretty sure about leaving before this. She reports her partner is suddenly much kinder and willing to apologize and work on the relationship, which also makes Leeona more hesitant to leave. Nandhini reports feeling conflicted about doing what is best for her and staying and supporting her partner and giving her a chance to make things right in the relationship. Azizah acknowledges that she is open to seeing where things go since she has to live with her partner at least until she graduates until December. Matthew reports feeling sadness and anger that her partner is just now seeking out help and working through her stuff when she has been asking her to do so for the past 14 years. Marlowe spent a good deal of the session discussing her partner's history of trauma. Bentli reports that her partner wants to engage in couple's counseling, but Kylani says she is not ready to engage in couples counseling. Towards the end of the session, Kateryn wanted to talk about procrastination. [name] and Thessaly also discussed other therapy options for Belva.   Suicidal/Homicidal: Nowithout intent/plan  Therapist Response: Social Work Intern ([place]) utilized active listening to encourage Kriste to share her thoughts and feelings. [name]  encouraged Sieara to really consider what she wanted instead of only thinking about her partner. [name] utilized motivational interviewing to encourage Arvis to explore where she wanted to be in regards to her relationship instead of only thinking about what her partner needs and wants. [name] reassured Brandie that is was ok to be unsure/ambiguous about the future of the relationship and what she wanted to do in regards to leaving or staying with her partner. [name] encouraged Dameka to keep processing her feelings and to talk to her partner openly about how she was feeling. [name] utilized CBT to help Latonyia address a problematic automatic thought behind her procrastination: "I do my best work when I am stressed/under pressure." [name] helped Kacie explore evidence that that was true and evidence that is was not true. Benjamin wants to further explore procrastination in the next session.   Plan: Return again in 1 weeks.   Diagnosis: Axis I: See current hospital problem list    Axis II: No diagnosis    [name], Student-SW 12/23/2015

## 2015-12-26 MED ORDER — METOPROLOL SUCCINATE ER 25 MG PO TB24: 25.0000 mg | ORAL_TABLET | Freq: Every day | ORAL | Status: DC

## 2015-12-26 MED FILL — METOPROLOL SUCC ER 25 MG TA: 25 | 30 days supply | Qty: 30 | Fill #0

## 2015-12-27 ENCOUNTER — Encounter: Payer: Self-pay | Admitting: Internal Medicine

## 2015-12-27 ENCOUNTER — Other Ambulatory Visit (HOSPITAL_COMMUNITY)
Admission: RE | Admit: 2015-12-27 | Discharge: 2015-12-27 | Disposition: A | Discharge status: Home / Self Care | Payer: No Typology Code available for payment source | Source: Ambulatory Visit | Attending: Internal Medicine | Admitting: Internal Medicine

## 2015-12-27 ENCOUNTER — Ambulatory Visit (INDEPENDENT_AMBULATORY_CARE_PROVIDER_SITE_OTHER): Payer: No Typology Code available for payment source | Admitting: Internal Medicine

## 2015-12-27 VITALS — BP 105/61 | HR 58 | Temp 98.8°F | Resp 18 | Ht 65.0 in | Wt 306.0 lb

## 2015-12-27 DIAGNOSIS — E8881 Metabolic syndrome: Secondary | ICD-10-CM

## 2015-12-27 DIAGNOSIS — R600 Localized edema: Secondary | ICD-10-CM

## 2015-12-27 DIAGNOSIS — Z124 Encounter for screening for malignant neoplasm of cervix: Secondary | ICD-10-CM

## 2015-12-27 DIAGNOSIS — Z01419 Encounter for gynecological examination (general) (routine) without abnormal findings: Secondary | ICD-10-CM | POA: Insufficient documentation

## 2015-12-27 LAB — COMPLETE METABOLIC PANEL WITH GFR
ALT: 22 U/L (ref 6–29)
AST: 19 U/L (ref 10–35)
Albumin: 4.1 g/dL (ref 3.6–5.1)
Alkaline Phosphatase: 54 U/L (ref 33–115)
BUN: 8 mg/dL (ref 7–25)
CALCIUM: 8.7 mg/dL (ref 8.6–10.2)
CHLORIDE: 105 mmol/L (ref 98–110)
CO2: 24 mmol/L (ref 20–31)
Creat: 0.88 mg/dL (ref 0.50–1.10)
GFR, EST NON AFRICAN AMERICAN: 79 mL/min (ref >=60)
Glucose, Bld: 85 mg/dL (ref 65–99)
POTASSIUM: 4.2 mmol/L (ref 3.5–5.3)
Sodium: 137 mmol/L (ref 135–146)
Total Bilirubin: 0.3 mg/dL (ref 0.2–1.2)
Total Protein: 6.3 g/dL (ref 6.1–8.1)

## 2015-12-27 LAB — HEMOGLOBIN A1C
HEMOGLOBIN A1C: 5.8 % — AB (ref <5.7)
MEAN PLASMA GLUCOSE: 120 mg/dL

## 2015-12-27 LAB — VITAMIN B12: VITAMIN B 12: 456 pg/mL (ref 200–1100)

## 2015-12-27 MED ORDER — METOPROLOL SUCCINATE ER 25 MG PO TB24: 25.0000 mg | ORAL_TABLET | Freq: Every day | ORAL | Status: DC

## 2015-12-27 NOTE — Progress Notes (Signed)
Patient is here for Proteinuria.  Patient is requesting a Pap smear be completed today.  Patient complains of bilateral leg pain being scaled currently at a 3.  Patient request refill on metoprolol.   Patient request referral to opthalmology.

## 2015-12-27 NOTE — Progress Notes (Signed)
Patient ID: Desiree Schultz, female   DOB: 1971/04/27, 45 y.o.   MRN: [id number]   Desiree Schultz, is a 45 y.o. female  [id number]  [id number]  DOB - 25-Sep-1970  Chief Complaint  Patient presents with  . Follow-up        Subjective:   Desiree Schultz is a 45 y.o. female extensive medical history including paroxysmal atrial fibrillation rate controlled on beta blocker, PCOS, diabetes, morbid obesity, chronic anxiety, and obstructive sleep apnea on CPAP machine here today for a follow up visit. Patient is here today requesting for Pap smear and for her annual physical examination. Her last Pap smear was in 2013, it was normal. She has no new complaints today. She said she is doing very well. She is on metformin for PCOS for the past almost 10 years. She needs refills on some of her medications today. She still has occasional lower ext edema but not today. She is sexually active with same female sexual partner for over 13 years. Patient has No headache, No chest pain, No abdominal pain - No Nausea, No new weakness tingling or numbness, No Cough - SOB.  Problem  Metabolic Syndrome    ALLERGIES: Allergies  Allergen Reactions  . Benadryl [Diphenhydramine Hcl]     itching  . Fish Oil     Makes hands raw, mouth and female area   . Influenza Vaccines     Gives pt the flu and site of injection swells and hot  . Latex     Burns skin  . Other Nausea Only    opiates  . Sulfur     Bladder spasms, mouth raw  . Tape     Burns skin, band-aid, ekg stickers    PAST MEDICAL HISTORY: Past Medical History  Diagnosis Date  . Atrial fibrillation ([place])     2006  . Menorrhagia     2011  . Hypothyroid   . Prediabetes   . Depression   . PCOS (polycystic ovarian syndrome)   . OSA (obstructive sleep apnea)     on CPAP  . Obesity (BMI 30-39.9)   . Chronic anxiety   . Vitamin D deficiency   . Allergic rhinitis   . Hx of cardiovascular stress test     a. Myoview 10/13:  no ischemia; EF 57%   . Elevated blood pressure     On therapy. Probably hypertension  . Dysrhythmia   . Bronchial breathing   . Anemia   . PONV (postoperative nausea and vomiting)     C/o motion sickness  . Diabetes mellitus without complication ([place])     MEDICATIONS AT HOME: Prior to Admission medications   Medication Sig Start Date End Date Taking? Authorizing Provider  Ascorbic Acid (VITAMIN C WITH ROSE HIPS) 1000 MG tablet Take 2 mg by mouth daily.   Yes Historical Provider, MD  aspirin 81 MG tablet Take 81 mg by mouth daily.   Yes Historical Provider, MD  cetirizine (ZYRTEC) 10 MG tablet Take 10 mg by mouth daily.     Yes Historical Provider, MD  dronedarone (MULTAQ) 400 MG tablet Take 1 tablet (400 mg total) by mouth 2 (two) times daily with a meal. 07/08/15  Yes Izela Altier E Perel Hauschild, MD  escitalopram (LEXAPRO) 20 MG tablet TAKE 1 TABLET BY MOUTH DAILY. 09/27/15  Yes Tresa Garter, MD  furosemide (LASIX) 20 MG tablet Take 1 tablet (20 mg total) by mouth daily. 12/22/15  Yes Tresa Garter, MD  levothyroxine (SYNTHROID, LEVOTHROID)  150 MCG tablet TAKE 1 TABLET BY MOUTH DAILY BEFORE BREAKFAST 11/11/15  Yes Jakeim Sedore E Doreene Burke, MD  lisinopril (PRINIVIL,ZESTRIL) 5 MG tablet Take 1 tablet (5 mg total) by mouth daily. 09/27/15  Yes Tresa Garter, MD  metFORMIN (GLUCOPHAGE-XR) 500 MG 24 hr tablet TAKE 2 TABLETS BY MOUTH DAILY 09/27/15  Yes Tresa Garter, MD  metFORMIN (GLUMETZA) 1000 MG (MOD) 24 hr tablet Take 1 tablet (1,000 mg total) by mouth daily. 01/06/15  Yes Leana Gamer, MD  metoprolol succinate (TOPROL-XL) 25 MG 24 hr tablet Take 1 tablet (25 mg total) by mouth daily. 12/27/15  Yes Tresa Garter, MD  spironolactone (ALDACTONE) 50 MG tablet Take 0.5 tablets (25 mg total) by mouth daily. 09/06/15  Yes Dorena Dew, FNP  Alaskan Red Algae POWD by Does not apply route.    Historical Provider, MD  albuterol (PROVENTIL HFA;VENTOLIN HFA) 108 (90 BASE) MCG/ACT inhaler Inhale 2  puffs into the lungs every 6 (six) hours as needed for wheezing or shortness of breath. Patient not taking: Reported on 08/22/2015 03/16/15   Micheline Chapman, NP  cholecalciferol (VITAMIN D) 400 UNITS TABS tablet Take 400 Units by mouth daily. Reported on 12/27/2015    Historical Provider, MD  HAWTHORN BERRIES PO Take 2,100 mg by mouth. Reported on 12/27/2015    Historical Provider, MD  Misc Natural Products (DANDELION ROOT PO) Take 3,500 mg by mouth. Reported on 12/27/2015    Historical Provider, MD  Multiple Vitamin (MULTIVITAMIN) capsule Take 1 capsule by mouth daily. Reported on 12/27/2015    Historical Provider, MD  vitamin E 400 UNIT capsule Take 800 Units by mouth daily. Reported on 12/27/2015    Historical Provider, MD  VITAMIN K PO Take 1 tablet by mouth daily. Reported on 12/27/2015    Historical Provider, MD     Objective:   Filed Vitals:   12/27/15 0833  BP: 105/61  Pulse: 58  Temp: 98.8 F (37.1 C)  TempSrc: Oral  Resp: 18  Height: 5\' 5"  (1.651 m)  Weight: 306 lb (138.801 kg)  SpO2: 98%    Exam General appearance : Awake, alert, not in any distress. Speech Clear. Not toxic looking, Obese HEENT: Atraumatic and Normocephalic, pupils equally reactive to light and accomodation Neck: supple, no JVD. No cervical lymphadenopathy.  Chest:Good air entry bilaterally, no added sounds  CVS: S1 S2 regular, no murmurs.  Abdomen: Bowel sounds present, Non tender and not distended with no gaurding, rigidity or rebound. Extremities: B/L Lower Ext shows no edema, both legs are warm to touch Neurology: Awake alert, and oriented X 3, CN II-XII intact, Non focal  Pelvic Exam: Cervix normal in appearance, external genitalia normal, no adnexal masses or tenderness, no cervical motion tenderness, rectovaginal septum normal, uterus normal size, shape, and consistency and vagina normal without discharge   Data Review Lab Results  Component Value Date   HGBA1C 5.9* 04/05/2015   HGBA1C 6.1*  12/30/2014   HGBA1C 5.8* 10/05/2014     Assessment & Plan   1. Bilateral edema of lower extremity  - Urinalysis, Complete  2. Metabolic syndrome  - COMPLETE METABOLIC PANEL WITH GFR - Hemoglobin A1c - Vitamin B12  3. Pap smear for cervical cancer screening  - Cytology - PAP - Cervicovaginal ancillary only  Patient have been counseled extensively about nutrition and exercise  Return in about 3 months (around 03/27/2016) for Routine Follow Up, Generalized Anxiety Disorder.  The patient was given clear instructions to go to ER  or return to medical center if symptoms don't improve, worsen or new problems develop. The patient verbalized understanding. The patient was told to call to get lab results if they haven't heard anything in the next week.   This note has been created with Surveyor, quantity. Any transcriptional errors are unintentional.    Angelica Chessman, MD, [place], [place], [place], [place] and [place], [place]   12/27/2015, 5:23 PM

## 2015-12-27 NOTE — Patient Instructions (Signed)

## 2015-12-28 LAB — URINALYSIS, COMPLETE
BILIRUBIN URINE: NEGATIVE
Casts: NONE SEEN [LPF]
Crystals: NONE SEEN [HPF]
GLUCOSE, UA: NEGATIVE
HGB URINE DIPSTICK: NEGATIVE
Ketones, ur: NEGATIVE
LEUKOCYTES UA: NEGATIVE
Nitrite: NEGATIVE
PROTEIN: NEGATIVE
RBC / HPF: NONE SEEN RBC/HPF (ref <=2)
SQUAMOUS EPITHELIAL / LPF: NONE SEEN [HPF] (ref <=5)
Specific Gravity, Urine: 1.006 (ref 1.001–1.035)
WBC UA: NONE SEEN WBC/HPF (ref <=5)
Yeast: NONE SEEN [HPF]
pH: 6 (ref 5.0–8.0)

## 2015-12-29 ENCOUNTER — Ambulatory Visit (INDEPENDENT_AMBULATORY_CARE_PROVIDER_SITE_OTHER): Payer: Self-pay | Admitting: Licensed Clinical Social Worker

## 2015-12-29 DIAGNOSIS — F325 Major depressive disorder, single episode, in full remission: Secondary | ICD-10-CM

## 2015-12-29 LAB — CERVICOVAGINAL ANCILLARY ONLY
CHLAMYDIA, DNA PROBE: NEGATIVE
NEISSERIA GONORRHEA: NEGATIVE
TRICH (WINDOWPATH): NEGATIVE

## 2015-12-29 LAB — CYTOLOGY - PAP

## 2015-12-29 NOTE — Progress Notes (Signed)
   THERAPY PROGRESS NOTE  Session Time: 22 Minutes  Participation Level: Active  Behavioral Response: CasualAlertEuthymic  Type of Therapy: Individual Therapy  Treatment Goals addressed: Coping  Interventions: CBT and Solution Focused  Summary: TERRA KIMMEY is a 45 y.o. female who presents with a euthymic mood and appropriate affect. Suzana reports that she has not talked to her partner about their conversation last week because they both need time to process and Zyniah feels ok about that. Deneane reports that she is feeling a lot of stress around homework that is due soon, but she continues to procrastinate. Zoeie reports that she has always had problems with focusing and procrastinating, but still does pretty well in school. Martika acknowledges that procrastinating has negative consequences, like feeling stressed out and waisting time dreading work instead of actually working, but feels unsure how to change her patterns of behavior. Chariti says she wants to stop procrastinating but feels like she can't, that it is automatic.   Suicidal/Homicidal: Nowithout intent/plan  Therapist Response: Social Work Intern ([place]) utilized active listening to encourage Oramae to share her thoughts and emotions. [name] utilitized solution-focused therapy and asked Mckenna to think about what would motivate her to stop procrastinating. Lillar said she would like to stop feeling the stress of leaving things until the last minute. [name] asked her to try working on something for just 20 minutes and writing down how it felt to do something ahead of time. Cathyann agreed to do so. [name] utilized CBT to encourage Keyshawna to challenge some of her automatic thoughts behind procrastination, which she said were that she does her best work under pressure. [name] then helped Virdia identify instances where that di dnot prove to be true- when she got poor grades or lost points for turning in work late. [name] and Charlis made a list of strategies to try to help Alsie  work on assignments ahead of time. Lilea stated that working on assignments with classmates would help, that doing work outside her home would help, and that doing part of her work in [employer], [employer] of time would help. She agreed to try those strategies and and report back next week. [name] also asked Elvia to write down or tape her thoughts next time she procrastinated so that she could remember how terrible it felt. When Deshunda expressed doubt that she could change her habits, [name] asked Wandalyn how it would feel to be stuck in this pattern the rest of her life and Faelynn agreed it would be awful. [name] acknowledged that making change is very hard, but that [name] was very confident Jerita could do it.   Plan: Return again in 1 weeks.  Diagnosis: Axis I: See hospital problem list    Axis II: No diagnosis    [name], Student-SW 12/29/2015

## 2016-01-02 LAB — CERVICOVAGINAL ANCILLARY ONLY
Bacterial vaginitis: NEGATIVE
CANDIDA VAGINITIS: NEGATIVE

## 2016-01-03 ENCOUNTER — Telehealth: Payer: Self-pay | Admitting: *Deleted

## 2016-01-03 MED FILL — SPIRONOLACTONE 50 MG TABLET: 50 | 30 days supply | Qty: 15 | Fill #4

## 2016-01-03 NOTE — Telephone Encounter (Signed)
Medical Assistant left message on patient's home and cell voicemail. Voicemail states to give a call back to Yvett Rossel with [facility] at [phone].  

## 2016-01-03 NOTE — Telephone Encounter (Signed)
-----   Message from Tresa Garter, MD sent at 12/28/2015 10:51 AM EDT ----- Please inform patient that her laboratory test results are normal. Vitamin B-12 level was normal. Hemoglobin A1c is 5.8%, in the prediabetes range. Continue metformin for PCOS and prediabetes. Continue Regular physical exercise and low carbohydrate diet.

## 2016-01-03 NOTE — Telephone Encounter (Signed)
Patient verified DOB Patient is aware of lab results being normal. Patient is aware of vitamin B-12 level being normal and her pap smear being normal. Patient advised to continue Metformin for PCOS and Prediabetes, increase regular exercise and low carb diet. Patient expressed her understanding and had no further questions at this time.

## 2016-01-05 ENCOUNTER — Ambulatory Visit (INDEPENDENT_AMBULATORY_CARE_PROVIDER_SITE_OTHER): Payer: Self-pay | Admitting: Licensed Clinical Social Worker

## 2016-01-05 DIAGNOSIS — F325 Major depressive disorder, single episode, in full remission: Secondary | ICD-10-CM

## 2016-01-05 NOTE — Progress Notes (Signed)
   THERAPY PROGRESS NOTE  Session Time: 90 mins  Participation Level: Active  Behavioral Response: CasualAlertEuthymic  Type of Therapy: Individual Therapy  Treatment Goals addressed: Coping  Interventions: Solution Focused and Supportive  Summary: MARLII FROST is a 45 y.o. female who presents with a euthymic mood and appropriate affect. At the beginning of the session, Social Work Intern ([place]) and Charrise talked about termination (today was their last session) and discussed that they both felt sad about the relationship ending. Michielle reports that she will work on finding another therapist. Yuleidy reports that she feels ready to start couple's counseling with her partner, Jenny Reichmann, so they can decide if they want to work on the relationship or end it. Lashondra reports that she is hopeful that they can make the relationship work since Jenny Reichmann is working so hard to address her own issues. Shadavia reports that she worked on her procrastination last week, and actually turned in an assignment early and that she wants to keep working on strategies to help her reduce procrastinating. Lajoy reported that her niece recently disclosed that she is gay to [place], and Shannel wants to support her. Mikinzie worries about her family's homophobic behavior and how that may hurt her niece, so she wanted to discuss ways to handle her family's reactions to her niece.   Suicidal/Homicidal: Nowithout intent/plan  Therapist Response: Social Work Intern ([place]) utilized active listening to encourage Siyana to share her thoughts and feelings. [name] praised Joviana for addressing and reducing her procrastination this week and asked Renella to walk through al the the positives she could identify as a result of turning in an assignment instead of waiting until the last minute. Cordellia identified that she felt less stressed, got more rest, and enjoyed doing her assignment more and was proud of turning it in early. [name] asked Kagan to write those feelings down and to  refer to them when she felt like procrastinating again. [name] and Tonilynn discussed other ways to address procrastination, like doing assignments in small chunks and and setting deadlines for herself that were before the teacher's deadlines. [name] and [name] also discussed the pros and cons of doing couple's counseling, and Naliyah stated the pro  Outweighed the cons. Deanne identified that she had been procrastinating  on doing couple's counseling since it would be a lot of work. [name] agreed that it would, but suggested that if she waited, she may stay in the same holding pattern in her relationship forever. Anya agreed and stated she was ready. [name] helped Marvelene identify couple's therapists in the area who would could work with [place] couples and who could do sliding scales or would take the orange card. [name] praised Stephane for supporting her niece and for actively thinking about ways to suport her and encourage her family to be open minded so her niece does not have to feel the discrimination that Kamauri felt and feels. [name] thanked Rise for allowing [name] to work with her over the last few months and praise her for working so hard in therapy.    Plan: Ramanda will contact Metta Clines, LCSW, about couple's counseling. Raynia will contact the [facility] counseling center, [facility] of the [place], and the [employer] to seek out a new individual therapist  Diagnosis: Axis I: See current hospital problem list    Axis II: No diagnosis    [name], Student-SW 01/05/2016

## 2016-01-13 MED FILL — LEVOTHYROXINE 150 MCG TAB: 150 | 30 days supply | Qty: 30 | Fill #2

## 2016-01-23 MED FILL — LISINOPRIL 5 MG TABLET: 5 | 30 days supply | Qty: 30 | Fill #4

## 2016-01-23 MED FILL — ?ESCITALOPRAM 20 MG TABLET: 20 | 30 days supply | Qty: 30 | Fill #4

## 2016-01-23 MED FILL — METOPROLOL SUCC ER 25 MG TA: 25 | 30 days supply | Qty: 30 | Fill #1

## 2016-01-23 MED FILL — METFORMIN HCL ER 500 MG TAB: 500 | 30 days supply | Qty: 60 | Fill #3

## 2016-01-23 MED FILL — FUROSEMIDE 20 MG TABLET: 20 | 30 days supply | Qty: 30 | Fill #0

## 2016-01-26 ENCOUNTER — Other Ambulatory Visit: Payer: Self-pay | Admitting: Internal Medicine

## 2016-01-26 DIAGNOSIS — M25561 Pain in right knee: Secondary | ICD-10-CM

## 2016-01-26 DIAGNOSIS — M25562 Pain in left knee: Principal | ICD-10-CM

## 2016-01-31 ENCOUNTER — Other Ambulatory Visit: Payer: Self-pay | Admitting: Internal Medicine

## 2016-02-10 ENCOUNTER — Other Ambulatory Visit: Payer: Self-pay | Admitting: Internal Medicine

## 2016-02-10 MED FILL — LEVOTHYROXINE 150 MCG TAB: 150 | 30 days supply | Qty: 30 | Fill #0

## 2016-02-10 MED FILL — ?SPIRONOLACTONE 50 MG TABLE: 50 | 30 days supply | Qty: 15 | Fill #5

## 2016-02-22 ENCOUNTER — Other Ambulatory Visit: Payer: Self-pay | Admitting: Internal Medicine

## 2016-02-22 MED FILL — LISINOPRIL 5 MG TABLET: 5 | 30 days supply | Qty: 30 | Fill #5

## 2016-02-22 MED FILL — FUROSEMIDE 20 MG TABLET: 20 | 30 days supply | Qty: 30 | Fill #0

## 2016-02-22 MED FILL — METOPROLOL SUCC ER 25 MG TA: 25 | 30 days supply | Qty: 30 | Fill #2

## 2016-02-22 MED FILL — METFORMIN HCL ER 500 MG TAB: 500 | 30 days supply | Qty: 60 | Fill #4

## 2016-02-22 MED FILL — ?ESCITALOPRAM 20 MG TABLET: 20 | 30 days supply | Qty: 30 | Fill #5

## 2016-02-27 ENCOUNTER — Ambulatory Visit: Payer: No Typology Code available for payment source

## 2016-03-13 ENCOUNTER — Other Ambulatory Visit: Payer: Self-pay | Admitting: Family Medicine

## 2016-03-13 MED FILL — ?SPIRONOLACTONE 50 MG TABLE: 50 | 60 days supply | Qty: 30 | Fill #0

## 2016-03-13 MED FILL — LEVOTHYROXINE 150 MCG TAB: 150 | 30 days supply | Qty: 30 | Fill #1

## 2016-03-19 ENCOUNTER — Telehealth: Payer: Self-pay | Admitting: Internal Medicine

## 2016-03-19 NOTE — Telephone Encounter (Signed)
New Message  Pt states the she is having swelling and weight gain issue due to the medicine (malthu) and she has stopped taking it. Please call.

## 2016-03-19 NOTE — Telephone Encounter (Signed)
Pt states she was having problems with LE edema.   Pt states she finally figured out that LE edema was because of Multaq. Pt states on her own 2 days ago she stopped Multaq and started propafenone 225mg  daily. Pt advised propafenone is prescribed to be taken BID, pt states Dr Caryl Comes is aware of her medical regimen. I reviewed with Megan, pharmacist--per Megan there should probably be wash out between Multaq and propafenone, pt should have EKG.  Pt is hesitant to discuss details with me, states it is complicated,and she is requesting an appointment with Dr Caryl Comes to discuss with him.  Pt advised I have scheduled her to see Dr Caryl Comes 03/21/16.

## 2016-03-20 ENCOUNTER — Ambulatory Visit: Payer: No Typology Code available for payment source | Attending: Internal Medicine

## 2016-03-21 ENCOUNTER — Ambulatory Visit (INDEPENDENT_AMBULATORY_CARE_PROVIDER_SITE_OTHER): Payer: No Typology Code available for payment source | Admitting: Internal Medicine

## 2016-03-21 ENCOUNTER — Encounter: Payer: Self-pay | Admitting: Internal Medicine

## 2016-03-21 VITALS — BP 124/64 | HR 62 | Ht 65.5 in | Wt 306.4 lb

## 2016-03-21 DIAGNOSIS — R001 Bradycardia, unspecified: Secondary | ICD-10-CM

## 2016-03-21 DIAGNOSIS — I480 Paroxysmal atrial fibrillation: Secondary | ICD-10-CM

## 2016-03-21 MED ORDER — RIVAROXABAN 20 MG PO TABS: 20.0000 mg | ORAL_TABLET | Freq: Every day | ORAL | Status: DC

## 2016-03-21 MED ORDER — PROPAFENONE HCL 150 MG PO TABS: ORAL_TABLET | ORAL | Status: DC

## 2016-03-21 MED FILL — PROPAFENONE HCL 150 MG TAB: 150 | 30 days supply | Qty: 60 | Fill #0

## 2016-03-21 NOTE — Progress Notes (Signed)
Patient Care Team: Tresa Garter, MD as PCP - General (Internal Medicine) Lorne Skeens, MD as Attending Physician (Endocrinology) Thompson Grayer, MD as Attending Physician (Cardiology)   HPI  Desiree Schultz is a 45 y.o. female Seen in followup for atrial fibrillation occurring in the context of polycystic ovarian syndrome. She was started on apixaban. It was her impression that atrial fibrillation tracked with her period.  She had been treated with flecainide which we decreased from 100--50 mg twice daily for fatigue; this was done at her last visit. She she ended up on propafenone which she ended up taking ONCE Daily but it also is associated with fatigue as is her metoprolol.    She is also concerned about her TSH and thyroid replacement I had spoken with Dr. Forde Dandy. She ended up under  care of her PCP PA becoming hyperthyroid . 2015   CT scoring was 0.  2015 Echo LVEF normal with mild LVE/LAE  Without hypertrophy   She found that following taking her dronaderone she developed significant edema. After number of months she found that resolved when she stopped and recurred when she resumed. She is no longer taking it. She is taking propafenone once daily. The arrhythmia burden during her drug-free time was significant  Thromboembolic risk profile is notable for gender, hypertension    There is a questionable family history of cardiomyopathy  we have spoken in the past about genetic testing although I can't for the life of me figure out what prompted me to pursue this discussion with her based on what we know thus far  She takes aldactone for her PCOS and her blood pressures are well-controlled.        Past Medical History  Diagnosis Date  . Atrial fibrillation ([place])     2006  . Menorrhagia     2011  . Hypothyroid   . Prediabetes   . Depression   . PCOS (polycystic ovarian syndrome)   . OSA (obstructive sleep apnea)     on CPAP  . Obesity (BMI 30-39.9)   .  Chronic anxiety   . Vitamin D deficiency   . Allergic rhinitis   . Hx of cardiovascular stress test     a. Myoview 10/13:  no ischemia; EF 57%  . Elevated blood pressure     On therapy. Probably hypertension  . Dysrhythmia   . Bronchial breathing   . Anemia   . PONV (postoperative nausea and vomiting)     C/o motion sickness  . Diabetes mellitus without complication [place])     Past Surgical History  Procedure Laterality Date  . Endometrial ablation    . Dilation and curettage, diagnostic / therapeutic    . Tonsillectomy  2007  . Bladder surgery  1997    interstitial cystitis   . Hand surgery  2006    due to cat bite  . Renal biopsy, open  2006  . Endometrial ablation  08/2010  . Ovarian cyst removal  04/28/13  . Breast surgery      reduction  . Knee arthroscopy with medial menisectomy Left 04/06/2015    Procedure: LEFT KNEE ARTHROSCOPY WITH PARTIAL MEDIAL MENISCECTOMY;  Surgeon: Leandrew Koyanagi, MD;  Location: [facility];  Service: Orthopedics;  Laterality: Left;    Current Outpatient Prescriptions  Medication Sig Dispense Refill  . Alaskan Red Algae POWD by Does not apply route.    . Ascorbic Acid (VITAMIN C WITH ROSE HIPS) 1000 MG tablet  Take 2 mg by mouth daily.    . cetirizine (ZYRTEC) 10 MG tablet Take 10 mg by mouth daily.      Marland Kitchen escitalopram (LEXAPRO) 20 MG tablet TAKE 1 TABLET BY MOUTH DAILY. 30 tablet 5  . furosemide (LASIX) 20 MG tablet TAKE 1 TABLET BY MOUTH DAILY. 30 tablet 0  . levothyroxine (SYNTHROID, LEVOTHROID) 150 MCG tablet TAKE 1 TABLET BY MOUTH DAILY BEFORE BREAKFAST 30 tablet 2  . lisinopril (PRINIVIL,ZESTRIL) 5 MG tablet Take 1 tablet (5 mg total) by mouth daily. 90 tablet 3  . metFORMIN (GLUCOPHAGE-XR) 500 MG 24 hr tablet TAKE 2 TABLETS BY MOUTH DAILY 60 tablet 6  . spironolactone (ALDACTONE) 50 MG tablet TAKE 1/2 TABLET BY MOUTH DAILY 30 tablet 2  . propafenone (RYTHMOL) 150 MG tablet Take one tablet by mouth twice daily 180 tablet 3  . rivaroxaban (XARELTO)  20 MG TABS tablet Take 1 tablet (20 mg total) by mouth daily with supper. 30 tablet 1  . [DISCONTINUED] ipratropium (ATROVENT) 0.06 % nasal spray Place 2 sprays into both nostrils 4 (four) times daily. (Patient not taking: Reported on 12/09/2014) 15 mL 0  . [DISCONTINUED] progesterone (PROMETRIUM) 200 MG capsule Take 1 capsule (200 mg total) by mouth daily. (Patient not taking: Reported on 12/09/2014) 14 capsule 3   No current facility-administered medications for this visit.    Allergies  Allergen Reactions  . Benadryl [Diphenhydramine Hcl]     itching  . Fish Oil     Makes hands raw, mouth and female area   . Influenza Vaccines     Gives pt the flu and site of injection swells and hot  . Latex     Burns skin  . Other Nausea Only    opiates  . Sulfur     Bladder spasms, mouth raw  . Tape     Burns skin, band-aid, ekg stickers    Review of Systems negative except from HPI and PMH  Physical Exam BP 124/64 mmHg  Pulse 62  Ht 5' 5.5" (1.664 m)  Wt 306 lb 6.4 oz (138.982 kg)  BMI 50.19 kg/m2  SpO2 96% Well developed and well nourished in no acute distress HENT normal E scleral and icterus clear Neck Supple JVP flat; carotids brisk and full Clear to ausculation  Regular rate and rhythm, no murmurs gallops or rub Soft with active bowel sounds No clubbing cyanosis  Edema Alert and oriented, grossly normal motor and sensory function Skin Warm and Dry  ECG  SR 62 20/08/41  Otherwise normal  Assessment and  Plan  Paroxysmal atrial fibrillation  Sinus bradycardia  Left ventricular enlargement  Hypothyroidism  Polycystic ovarian syndrome  Hypertension  Obstructive sleep apnea     To her credit She sorted out that the dronaderone was associated with edema and the apixoban with proteinuria.  Start her on Rivaroxaban as an anticoagulant.  We will increase her propafenone from 225 daily--150 twice a day. She will take her metoprolol as needed.  Her thyroid little  bit out of whack. Her PCP is working on that.  We spent more than 50% of our >25 min visit in face to face counseling regarding the above

## 2016-03-21 NOTE — Patient Instructions (Signed)
Medication Instructions: - Your physician has recommended you make the following change in your medication:  1) Stop Aspirin 2) Stop metoprolol 3) Start Propafenone 150 mg one tablet by mouth twice daily 4) Start Xarelto 20 mg one tablet by mouth at supper time.  Labwork: - none  Procedures/Testing: - none  Follow-Up: - Your physician wants you to follow-up in: 6 months with Dr. Caryl Comes. You will receive a reminder letter in the mail two months in advance. If you don't receive a letter, please call our office to schedule the follow-up appointment.  Any Additional Special Instructions Will Be Listed Below (If Applicable).     If you need a refill on your cardiac medications before your next appointment, please call your pharmacy.

## 2016-03-23 ENCOUNTER — Other Ambulatory Visit: Payer: Self-pay | Admitting: Internal Medicine

## 2016-03-23 MED FILL — FUROSEMIDE 20 MG TABLET: 20 | 30 days supply | Qty: 30 | Fill #1

## 2016-03-23 MED FILL — METFORMIN HCL ER 500 MG TAB: 500 | 30 days supply | Qty: 60 | Fill #5

## 2016-03-23 MED FILL — LISINOPRIL 5 MG TABLET: 5 | 30 days supply | Qty: 30 | Fill #6

## 2016-03-23 NOTE — Telephone Encounter (Signed)
Refill escitalopram

## 2016-03-26 MED FILL — ?ESCITALOPRAM 20 MG TABLET: 20 | 30 days supply | Qty: 30 | Fill #0

## 2016-03-27 ENCOUNTER — Ambulatory Visit: Payer: No Typology Code available for payment source | Admitting: Family Medicine

## 2016-03-27 ENCOUNTER — Ambulatory Visit: Payer: No Typology Code available for payment source | Admitting: Internal Medicine

## 2016-03-28 ENCOUNTER — Telehealth: Payer: Self-pay | Admitting: Internal Medicine

## 2016-03-28 NOTE — Telephone Encounter (Signed)
Spoke with patient who states she was seen by Dr. Caryl Comes last week and he changed her medications.  She wants to know if she can take propafenone ER, states the 150 mg BID is not working.  She states her pulse was 128 bpm in afib this afternoon after cycling and has now returned to 80 bpm.  She states she does not feel well and is going to lay down for a while.  I reviewed Dr. Olin Pia last office visit note and advised her that she can take metoprolol as needed.  She states she has not taken one yet but will do so now.  I advised her that Dr. Caryl Comes is out of the office this week and to call back if symptoms worsen for advice from one of our other providers or appointment with Afib clinic.  She verbalized understanding and agreement with plan.

## 2016-03-28 NOTE — Telephone Encounter (Signed)
New Message  Pt wanting to speak to Nurse about recent medication. Please Follow-Up. wy

## 2016-04-02 NOTE — Telephone Encounter (Signed)
Will forward to Dr. Klein to review. 

## 2016-04-02 NOTE — Telephone Encounter (Signed)
What is her HR at rest  If 80s can increase propafenone to tid or ER 225 bid Also what is the status of her thryoid?  It was out of whack in Dec and not checked ( that I see)

## 2016-04-03 NOTE — Telephone Encounter (Signed)
I called and spoke with the patient earlier today. She is uncertain of her resting HR's. She just feels as though she is having almost daily breakthrough of her a-fib (minutes). She did have an episode recently where she road her bike one afternoon and the next morning she was in a-fib with HR"s in the 100's. This lasted about 1-2 hours with the propafenone dose she is currently taking. When she converted her HR's were in the 65's, so she took PRN metoprolol and this brought her HR's back down to to the 60's. She feels she does better on ER meds and currently has been taking a dose of metoprolol at night since her a-fib episode a few days ago. She does not want to take daily metoprolol as this makes her feel tired. I have asked that she check her resting HR's for the next 2 mornings and I will call her back on Thursday to follow up. At that time, I can review with Dr. Caryl Comes and he can make recommendations. She is agreeable.

## 2016-04-05 ENCOUNTER — Telehealth: Payer: Self-pay | Admitting: Internal Medicine

## 2016-04-05 NOTE — Telephone Encounter (Signed)
Chauncey Reading Price at 04/05/2016 2:41 PM   Status: Signed    New message       Pt c/o medication issue:  1. Name of Medication: propafenone 2. How are you currently taking this medication (dosage and times per day)? 150mg  bid 3. Are you having a reaction (difficulty breathing--STAT)? no 4. What is your medication issue? Pt states she is still in AFIB.  Please call

## 2016-04-05 NOTE — Telephone Encounter (Signed)
I left a message for the patient to call regarding resting HR's over the last 2 days.

## 2016-04-05 NOTE — Telephone Encounter (Signed)
New message       Pt c/o medication issue:  1. Name of Medication: propafenone 2. How are you currently taking this medication (dosage and times per day)? 150mg  bid 3. Are you having a reaction (difficulty breathing--STAT)? no 4. What is your medication issue? Pt states she is still in AFIB.  Please call

## 2016-04-05 NOTE — Telephone Encounter (Signed)
Will attach to previous encounter. See 7/26 phone note.

## 2016-04-06 ENCOUNTER — Telehealth: Payer: Self-pay | Admitting: Internal Medicine

## 2016-04-06 NOTE — Telephone Encounter (Signed)
See phone note dated 7/26 for documentation regarding this call.

## 2016-04-06 NOTE — Telephone Encounter (Signed)
New message   Pt verbalized she said she is returning call for rn  About previous call, she said she will talk to rn when I asked for more information on HR

## 2016-04-06 NOTE — Telephone Encounter (Signed)
Spoke with pt. She reports on 8/2 and 8/3 her heart rate was 50-60 when she woke up and sat on the side of the bed.  After getting up and walking around house heart rate increased to 80's. After sitting back down heart rate was 60's-70's.  After being up for awhile her resting heart rate will stay 75-80.  Pt reports propafenone is not working. States she was in atrial fib off and on all day Wednesday and Thursday morning.  She thinks she may do better on extended release medication. I told pt I would forward to Dr. Caryl Comes for review and we would call her back with his recommendations.  Pt aware Dr. Caryl Comes is not in office today.

## 2016-04-09 ENCOUNTER — Telehealth: Payer: Self-pay | Admitting: Internal Medicine

## 2016-04-09 NOTE — Telephone Encounter (Signed)
Pt states that she had to stop Multaq because of side effects.

## 2016-04-09 NOTE — Telephone Encounter (Signed)
New message   Pt heart rate has been in the 50-60 range resting  Patient c/o Palpitations:  High priority if patient c/o lightheadedness and shortness of breath.  1. How long have you been having palpitations? FOR 2 DAYS   2. Are you currently experiencing lightheadedness and shortness of breath? SOB  3. Have you checked your BP and heart rate? (document readings) 50-60  4. Are you experiencing any other symptoms? NO

## 2016-04-09 NOTE — Telephone Encounter (Signed)
Pt states metoprolol was added prn. Pt states she continues to have problems getting her heart rate under control and is complaining of shortness of breath associated with fast heart rate.  Pt advised she has been scheduled with Roderic Palau, NP in the [facility] 04/10/16 at 9:30AM.

## 2016-04-09 NOTE — Telephone Encounter (Signed)
Pt states Multaq was stopped and propafenone was started and dose adjusted.

## 2016-04-10 ENCOUNTER — Encounter (HOSPITAL_COMMUNITY): Payer: Self-pay | Admitting: Nurse Practitioner

## 2016-04-10 ENCOUNTER — Ambulatory Visit (HOSPITAL_COMMUNITY)
Admission: RE | Admit: 2016-04-10 | Discharge: 2016-04-10 | Disposition: A | Discharge status: Home / Self Care | Payer: No Typology Code available for payment source | Source: Ambulatory Visit | Attending: Nurse Practitioner | Admitting: Nurse Practitioner

## 2016-04-10 ENCOUNTER — Encounter: Payer: Self-pay | Admitting: Licensed Clinical Social Worker

## 2016-04-10 VITALS — BP 118/76 | HR 69 | Ht 65.5 in | Wt 311.0 lb

## 2016-04-10 DIAGNOSIS — Z87891 Personal history of nicotine dependence: Secondary | ICD-10-CM | POA: Insufficient documentation

## 2016-04-10 DIAGNOSIS — Z7901 Long term (current) use of anticoagulants: Secondary | ICD-10-CM | POA: Insufficient documentation

## 2016-04-10 DIAGNOSIS — Z6839 Body mass index (BMI) 39.0-39.9, adult: Secondary | ICD-10-CM | POA: Insufficient documentation

## 2016-04-10 DIAGNOSIS — I480 Paroxysmal atrial fibrillation: Secondary | ICD-10-CM | POA: Insufficient documentation

## 2016-04-10 DIAGNOSIS — Z8489 Family history of other specified conditions: Secondary | ICD-10-CM | POA: Insufficient documentation

## 2016-04-10 DIAGNOSIS — Z8249 Family history of ischemic heart disease and other diseases of the circulatory system: Secondary | ICD-10-CM | POA: Insufficient documentation

## 2016-04-10 DIAGNOSIS — G4733 Obstructive sleep apnea (adult) (pediatric): Secondary | ICD-10-CM | POA: Insufficient documentation

## 2016-04-10 DIAGNOSIS — Z887 Allergy status to serum and vaccine status: Secondary | ICD-10-CM | POA: Insufficient documentation

## 2016-04-10 DIAGNOSIS — Z91040 Latex allergy status: Secondary | ICD-10-CM | POA: Insufficient documentation

## 2016-04-10 DIAGNOSIS — F329 Major depressive disorder, single episode, unspecified: Secondary | ICD-10-CM | POA: Insufficient documentation

## 2016-04-10 DIAGNOSIS — E039 Hypothyroidism, unspecified: Secondary | ICD-10-CM | POA: Insufficient documentation

## 2016-04-10 DIAGNOSIS — E119 Type 2 diabetes mellitus without complications: Secondary | ICD-10-CM | POA: Insufficient documentation

## 2016-04-10 DIAGNOSIS — E669 Obesity, unspecified: Secondary | ICD-10-CM | POA: Insufficient documentation

## 2016-04-10 DIAGNOSIS — Z7984 Long term (current) use of oral hypoglycemic drugs: Secondary | ICD-10-CM | POA: Insufficient documentation

## 2016-04-10 DIAGNOSIS — E559 Vitamin D deficiency, unspecified: Secondary | ICD-10-CM | POA: Insufficient documentation

## 2016-04-10 DIAGNOSIS — Z888 Allergy status to other drugs, medicaments and biological substances status: Secondary | ICD-10-CM | POA: Insufficient documentation

## 2016-04-10 DIAGNOSIS — E282 Polycystic ovarian syndrome: Secondary | ICD-10-CM | POA: Insufficient documentation

## 2016-04-10 DIAGNOSIS — J309 Allergic rhinitis, unspecified: Secondary | ICD-10-CM | POA: Insufficient documentation

## 2016-04-10 DIAGNOSIS — Z79899 Other long term (current) drug therapy: Secondary | ICD-10-CM | POA: Insufficient documentation

## 2016-04-10 DIAGNOSIS — Z9889 Other specified postprocedural states: Secondary | ICD-10-CM | POA: Insufficient documentation

## 2016-04-10 MED ORDER — PROPAFENONE HCL ER 225 MG PO CP12: 225.0000 mg | ORAL_CAPSULE | Freq: Two times a day (BID) | ORAL | 6 refills | Status: DC

## 2016-04-10 MED ORDER — DILTIAZEM HCL ER COATED BEADS 120 MG PO CP24: 120.0000 mg | ORAL_CAPSULE | Freq: Every day | ORAL | 6 refills | Status: DC

## 2016-04-10 MED FILL — PROPAFENONE HCL ER 225 MG C: 225 | 30 days supply | Qty: 60 | Fill #0

## 2016-04-10 MED FILL — dilTIAZem CD 120 MG CP24: 120 | 30 days supply | Qty: 30 | Fill #0

## 2016-04-10 NOTE — Telephone Encounter (Addendum)
The patient was seen by Roderic Palau, NP in the a-fib clinic today.  Per Butch Penny:  Will try Rythmol 225 mg SR bid and not use metoprolol, to try to diminish afib episodes If brady, fatigue are not acceptable, she will try metoprolol 225 SR once a day with cardizem 120 mg qd( hopefully, will have less fatigue on CCB) Ultimately, she may be able to try tikosyn or ablation Continue xarelto  Dr. Caryl Comes is aware.

## 2016-04-10 NOTE — Progress Notes (Signed)
CSW referred to assist options for insurance. Patient reports she currently has the orange card and has applied for the [place] discount program. Patient currently is in school and hopes to be completed by January to begin employment. CSW discussed medicaid eligibility and disability review. Patient does not feel she is appropriate for disability at this time as she is hopeful to gain employment. Patient does need some additional medical interventions which is unclear if the orange card/cone discount program would provide needed coverage. CSW left message for [facility] card counselor at [facility]. Patient will return next week for follow up in the clinic and meet with CSW at that time. [name], LCSW [phone]

## 2016-04-10 NOTE — Patient Instructions (Signed)
Your physician has recommended you make the following change in your medication:  1)Rythmol ER 225mg  - twice a day 2)Cardizem CD 120mg  -- take 1 tablet daily AS NEEDED for breakthrough afib.

## 2016-04-10 NOTE — Progress Notes (Signed)
Patient ID: Desiree Schultz, female   DOB: 1970/09/04, 45 y.o.   MRN: [id number]     Primary Care Physician: Desiree Chessman, MD Referring Physician: Dr. Jolyn Schultz   Desiree Schultz is a 44 y.o. female with a h/o afib in the setting of polycystic ovarian syndrome, in the afib clinic to be evaluated for issues with afib and side effects of Rythmol. She has failed flecainide due to fatigue and most recently multaq was stopped due to significant pedal edema. She has been recently started on Rythmol 150 mg bid with h/o of fatigue, slow heart rate with Rythmol just once a day. Has issues with metoprolol slowing her heart rate and making her feel fatigued with exercise. Since she was started on Rythmol  150 mg bid  7/19, she has had daily episodes of PAF. To offset this, she added back metoprolol ER 25 mg 1/2 tab, which helps to control the afib but makes her feel so weak that she feels that she just cant find the strength and can't get her HR up enough to be able to function well on the bike. She denies drinking any alcohol, no tobacco, no decaf, has OSA and wears CPAP religiously. 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 Ultimately, she may be an candidate for Tikosyn or ablation(weight though will lessen chances of success). On xarelto, for CHA2DS2VASc score of at least 3.  Today, she denies symptoms of  chest pain, shortness of breath, orthopnea, PND, lower extremity edema, dizziness, presyncope, syncope, or neurologic sequela. Positive for fatigue, palpitations.  The patient is tolerating medications without difficulties and is otherwise without complaint today.   Past Medical History:  Diagnosis Date  . Allergic rhinitis   . Anemia   . Atrial fibrillation ([place])    2006  . Bronchial breathing   . Chronic anxiety   . Depression   . Diabetes mellitus without complication ([place])   . Dysrhythmia   . Elevated blood pressure      On therapy. Probably hypertension  . Hx of cardiovascular stress test    a. Myoview 10/13:  no ischemia; EF 57%  . Hypothyroid   . Menorrhagia    2011  . Obesity (BMI 30-39.9)   . OSA (obstructive sleep apnea)    on CPAP  . PCOS (polycystic ovarian syndrome)   . PONV (postoperative nausea and vomiting)    C/o motion sickness  . Prediabetes   . Vitamin D deficiency    Past Surgical History:  Procedure Laterality Date  . BLADDER SURGERY  1997   interstitial cystitis   . BREAST SURGERY     reduction  . DILATION AND CURETTAGE, DIAGNOSTIC / THERAPEUTIC    . ENDOMETRIAL ABLATION    . ENDOMETRIAL ABLATION  08/2010  . HAND SURGERY  2006   due to cat bite  . KNEE ARTHROSCOPY WITH MEDIAL MENISECTOMY Left 04/06/2015   Procedure: LEFT KNEE ARTHROSCOPY WITH PARTIAL MEDIAL MENISCECTOMY;  Surgeon: Desiree Koyanagi, MD;  Location: [facility];  Service: Orthopedics;  Laterality: Left;  . OVARIAN CYST REMOVAL  04/28/13  . RENAL BIOPSY, OPEN  2006  . TONSILLECTOMY  2007    Current Outpatient Prescriptions  Medication Sig Dispense Refill  . [place] Red Algae POWD by Does not apply route.    . Ascorbic Acid (VITAMIN C WITH ROSE HIPS) 1000 MG tablet Take 2 mg by mouth daily.    . cetirizine (ZYRTEC)  10 MG tablet Take 10 mg by mouth daily.      Marland Kitchen escitalopram (LEXAPRO) 20 MG tablet TAKE 1 TABLET BY MOUTH DAILY. 30 tablet 3  . levothyroxine (SYNTHROID, LEVOTHROID) 150 MCG tablet TAKE 1 TABLET BY MOUTH DAILY BEFORE BREAKFAST 30 tablet 2  . lisinopril (PRINIVIL,ZESTRIL) 5 MG tablet Take 1 tablet (5 mg total) by mouth daily. 90 tablet 3  . metFORMIN (GLUCOPHAGE-XR) 500 MG 24 hr tablet TAKE 2 TABLETS BY MOUTH DAILY 60 tablet 6  . rivaroxaban (XARELTO) 20 MG TABS tablet Take 1 tablet (20 mg total) by mouth daily with supper. 30 tablet 1  . spironolactone (ALDACTONE) 50 MG tablet Take 50 mg by mouth daily.    Marland Kitchen diltiazem (CARDIZEM CD) 120 MG 24 hr capsule Take 1 capsule (120 mg total) by mouth daily. 30 capsule  6  . furosemide (LASIX) 20 MG tablet TAKE 1 TABLET BY MOUTH DAILY. (Patient not taking: Reported on 04/10/2016) 30 tablet 0  . metoprolol succinate (TOPROL-XL) 25 MG 24 hr tablet 12.5 mg at bedtime.  3  . propafenone (RYTHMOL SR) 225 MG 12 hr capsule Take 1 capsule (225 mg total) by mouth 2 (two) times daily. 60 capsule 6   No current facility-administered medications for this encounter.     Allergies  Allergen Reactions  . Benadryl [Diphenhydramine Hcl]     itching  . Fish Oil     Makes hands raw, mouth and female area   . Influenza Vaccines     Gives pt the flu and site of injection swells and hot  . Latex     Burns skin  . Other Nausea Only    opiates  . Sulfur     Bladder spasms, mouth raw  . Tape     Burns skin, band-aid, ekg stickers    Social History   Social History  . Marital status: Single    Spouse name: N/A  . Number of children: N/A  . Years of education: N/A   Occupational History  . courier (auto parts) [employer]   Social History Main Topics  . Smoking status: Former Smoker    Packs/day: 2.00    Years: 16.00    Types: Cigarettes    Quit date: 09/03/1998  . Smokeless tobacco: Never Used  . Alcohol use Yes     Comment: maybe 2 a mth  . Drug use:     Types: Marijuana     Comment: in high school -- none since  . Sexual activity: Not on file   Other Topics Concern  . Not on file   Social History Narrative   Pt lives in [place] with friend.  Works as a Designer, jewellery    Family History  Problem Relation Age of Onset  . Cardiomyopathy Father   . Heart disease Father   . Hyperlipidemia Father   . Hypertension Father   . Hypertension      fathers side  . Hyperlipidemia      fathers side  . Heart failure      both grandparents - paternal  . Coronary artery disease Maternal Grandmother   . Heart attack Maternal Grandmother     and other maternal family members  . Heart attack Maternal Grandfather     ROS- All systems are reviewed  and negative except as per the HPI above  Physical Exam: Vitals:   04/10/16 0943  BP: 118/76  Pulse: 69  Weight: (!) 311 lb (141.1 kg)  Height:  5' 5.5" (1.664 m)    GEN- The patient is well appearing, alert and oriented x 3 today.   Head- normocephalic, atraumatic Eyes-  Sclera clear, conjunctiva pink Ears- hearing intact Oropharynx- clear Neck- supple, no JVP Lymph- no cervical lymphadenopathy Lungs- Clear to ausculation bilaterally, normal work of breathing Heart- Regular rate and rhythm, no murmurs, rubs or gallops, PMI not laterally displaced GI- soft, NT, ND, + BS Extremities- no clubbing, cyanosis, or edema MS- no significant deformity or atrophy Skin- no rash or lesion Psych- euthymic mood, full affect Neuro- strength and sensation are intact  EKG- NSR at 69 bpm, pr int 170 ms, qrs int 70 ms, qtc 413 ms Epic records reviewed  Assessment and Plan: 1. PAF Failed flecainide, multaq Having issues with Rythmol and metoprolol Long discussion with pt regarding options, which are limited at this point due to lack of insurance. Pt feels that she may do better with sustained release of meds with less afib Will try Rythmol 225 mg SR bid and not use metoprolol, to try to diminish afib episodes If brady, fatigue are not acceptable, she will try metoprolol 225 SR once a day with cardizem 120 mg qd( hopefully, will have less fatigue on CCB) Ultimately, she may be able to try tikosyn or ablation Continue xarelto Continue CPAP  F/u  one week with EKG only for increased dose of Rythmol In to speak to SW today as well  Butch Penny C. Julene Rahn, [facility] [address] [place], [place] [address] [phone]

## 2016-04-11 MED FILL — LEVOTHYROXINE 150 MCG TAB: 150 | 30 days supply | Qty: 30 | Fill #2

## 2016-04-11 MED FILL — ?SPIRONOLACTONE 50 MG TABLE: 50 | 60 days supply | Qty: 30 | Fill #1

## 2016-04-17 ENCOUNTER — Other Ambulatory Visit: Payer: Self-pay | Admitting: *Deleted

## 2016-04-17 MED ORDER — RIVAROXABAN 20 MG PO TABS: 20.0000 mg | ORAL_TABLET | Freq: Every day | ORAL | 1 refills | Status: DC

## 2016-04-17 MED FILL — XARELTO 20 MG TABLET: 20 | 30 days supply | Qty: 30 | Fill #0

## 2016-04-17 NOTE — Telephone Encounter (Signed)
Patient requested that the rx for xarelto be sent to the pharmacy as she misplaced the printed copy that was provided to her at her office visit.

## 2016-04-19 ENCOUNTER — Encounter: Payer: Self-pay | Admitting: Licensed Clinical Social Worker

## 2016-04-19 ENCOUNTER — Ambulatory Visit (HOSPITAL_COMMUNITY)
Admission: RE | Admit: 2016-04-19 | Discharge: 2016-04-19 | Disposition: A | Discharge status: Home / Self Care | Payer: No Typology Code available for payment source | Source: Ambulatory Visit | Attending: Nurse Practitioner | Admitting: Nurse Practitioner

## 2016-04-19 DIAGNOSIS — I480 Paroxysmal atrial fibrillation: Secondary | ICD-10-CM

## 2016-04-19 DIAGNOSIS — I4891 Unspecified atrial fibrillation: Secondary | ICD-10-CM | POA: Insufficient documentation

## 2016-04-19 DIAGNOSIS — I491 Atrial premature depolarization: Secondary | ICD-10-CM | POA: Insufficient documentation

## 2016-04-19 DIAGNOSIS — Z79899 Other long term (current) drug therapy: Secondary | ICD-10-CM | POA: Insufficient documentation

## 2016-04-19 MED ORDER — DILTIAZEM HCL 30 MG PO TABS: ORAL_TABLET | ORAL | 1 refills | Status: DC

## 2016-04-19 MED FILL — dilTIAZem HCL 30 MG TABS: 30 | 30 days supply | Qty: 45 | Fill #0

## 2016-04-19 NOTE — Progress Notes (Addendum)
Pt in for EKG today to be reviewed by Roderic Palau, NP  Propafenone increased last week to 225 mg bid and pt reports much less afib and has not had to take  any daily cardizem. We discussed that she may do well with a pill in pocket approach with 30 mg cardizem to use as needed for palps. SR today with rate of 71 bpm, pr int 180 ms, qrs int 78 ms, qtc 423 ms

## 2016-04-19 NOTE — Patient Instructions (Signed)
Cardizem 30mg -- take 1 tablet every 4 hours AS NEEDED for heart rate >100 as long as blood pressure >100.   

## 2016-04-19 NOTE — Progress Notes (Signed)
CSW met with patient after Afib visit. Patient states she needs gastric surgery and unsure if covered by cone discount program. CSW spoke with Johnnette Barrios (financial counselor) about coverage from the [facility] discount program. Patient plans to make appointment and proceed with gastric sleeve surgery. She is hopeful that losing weight will reduce her cardiac issues. Patient verbalizes understanding of follow up needed and has CSW number if needed. [name], LCSW [phone]

## 2016-04-23 MED FILL — ?LISINOPRIL 5 MG TABLET: 5 | 30 days supply | Qty: 30 | Fill #7

## 2016-04-23 MED FILL — ?ESCITALOPRAM 20 MG TABLET: 20 | 30 days supply | Qty: 30 | Fill #1

## 2016-04-23 MED FILL — METFORMIN HCL ER 500 MG TAB: 500 | 30 days supply | Qty: 60 | Fill #6

## 2016-05-08 MED FILL — LEVOTHYROXINE 150 MCG TAB: 150 | 30 days supply | Qty: 30 | Fill #3

## 2016-05-08 MED FILL — PROPAFENONE HCL ER 225 MG C: 225 | 30 days supply | Qty: 60 | Fill #1

## 2016-05-11 ENCOUNTER — Other Ambulatory Visit: Payer: Self-pay | Admitting: *Deleted

## 2016-05-11 MED ORDER — PROPAFENONE HCL ER 225 MG PO CP12: 225.0000 mg | ORAL_CAPSULE | Freq: Two times a day (BID) | ORAL | 3 refills | Status: DC

## 2016-05-11 NOTE — Telephone Encounter (Signed)
PRINTED FOR PASS PROGRAM 

## 2016-05-18 MED FILL — ?LISINOPRIL 5 MG TABLET: 5 | 30 days supply | Qty: 30 | Fill #8

## 2016-05-18 MED FILL — ESCITALOPRAM 20 MG TABLET: 20 | 30 days supply | Qty: 30 | Fill #2

## 2016-05-18 MED FILL — XARELTO 20 MG TABLET: 20 | 30 days supply | Qty: 30 | Fill #1

## 2016-05-21 ENCOUNTER — Other Ambulatory Visit: Payer: Self-pay | Admitting: Internal Medicine

## 2016-05-21 ENCOUNTER — Encounter: Payer: Self-pay | Admitting: Internal Medicine

## 2016-05-21 ENCOUNTER — Ambulatory Visit (INDEPENDENT_AMBULATORY_CARE_PROVIDER_SITE_OTHER): Payer: No Typology Code available for payment source | Admitting: Internal Medicine

## 2016-05-21 VITALS — BP 124/74 | HR 58 | Temp 98.5°F | Resp 16 | Ht 65.5 in | Wt 302.0 lb

## 2016-05-21 DIAGNOSIS — F329 Major depressive disorder, single episode, unspecified: Secondary | ICD-10-CM

## 2016-05-21 DIAGNOSIS — R809 Proteinuria, unspecified: Secondary | ICD-10-CM

## 2016-05-21 DIAGNOSIS — I480 Paroxysmal atrial fibrillation: Secondary | ICD-10-CM

## 2016-05-21 DIAGNOSIS — F32A Depression, unspecified: Secondary | ICD-10-CM

## 2016-05-21 DIAGNOSIS — E119 Type 2 diabetes mellitus without complications: Secondary | ICD-10-CM

## 2016-05-21 DIAGNOSIS — E039 Hypothyroidism, unspecified: Secondary | ICD-10-CM

## 2016-05-21 DIAGNOSIS — E8881 Metabolic syndrome: Secondary | ICD-10-CM

## 2016-05-21 LAB — POCT URINALYSIS DIPSTICK
BILIRUBIN UA: NEGATIVE
Glucose, UA: NEGATIVE
KETONES UA: NEGATIVE
LEUKOCYTES UA: NEGATIVE
NITRITE UA: NEGATIVE
PH UA: 5
PROTEIN UA: NEGATIVE
RBC UA: NEGATIVE
Spec Grav, UA: 1.01
Urobilinogen, UA: 0.2

## 2016-05-21 LAB — GLUCOSE, POCT (MANUAL RESULT ENTRY): POC GLUCOSE: 108 mg/dL — AB (ref 70–99)

## 2016-05-21 MED ORDER — CETIRIZINE HCL 10 MG PO TABS: 10.0000 mg | ORAL_TABLET | Freq: Every day | ORAL | 11 refills | Status: AC

## 2016-05-21 MED ORDER — LOSARTAN POTASSIUM 25 MG PO TABS: 25.0000 mg | ORAL_TABLET | Freq: Every day | ORAL | 11 refills | Status: DC

## 2016-05-21 MED ORDER — RIVAROXABAN 20 MG PO TABS: 20.0000 mg | ORAL_TABLET | Freq: Every day | ORAL | 11 refills | Status: DC

## 2016-05-21 NOTE — Progress Notes (Signed)
Subjective:    Patient ID: Desiree Schultz, female    DOB: Dec 20, 1970, 45 y.o.   MRN: [id number]  HPI   1.  Paroxysmal Atrial Fibrillation:  Diagnosed age 34 yo.  Had erratic palpitations with dyspnea, presyncope.  Rate is fairly well controlled currently with Propafenone.  She has been on  Flecainide, and Multaq, Metoprolol with side effects, bradycardia or lack of effect.   There is a possibility of ablation in the future, especially if can lose weight. Having difficulty with biking outside on hills as feels her heart rate is does not get up high enough for her.  Cannot be physically active enough to lose weight. Not clear she is interested in a controlled environment with cycling. Has not had cardiac rehab with her cardiac condition.   On Xarelto and tolerating that as well.  Was on Eliquus and developed proteinuria.  2.  Proteinuria:  States from Eliquus: Taking Lisinopril 5 mg daily. Diagnosed with this several months ago.  3.  PCOS:  Thinks she was diagnosed at age 39 yo.  Has had a weight issue on and off all of her life.  She is currently at her highest weight now.  Taking Metformin ER 500 mg 2 tabs each day at bedtime.  Spironolactone 50 mg 1/2 tab daily.  Has helped with hair growth, but still has fuzzy facial hair she needs to shave.    4.  Hypothyroidism:  Diagnosed at age 5 yo.  Taking 150 mcg of Levothyroxine currently.  Last check was in 12.2016 and TSH was in a good range.  5.  Depression:  Entire life:  Escitalopram 20 mg daily.  Was on Citalopram in the past at high dose and was removed from that due to concern it could be contributing to her rhythm issues.  6.  Cough:  Brings this up at end of visit:  Has had dry cough since starting Lisinopril some time ago.  Cannot recall exactly when started.  Wants to know if could change meds due to this.  Current Meds  Medication Sig  . Ascorbic Acid (VITAMIN C WITH ROSE HIPS) 1000 MG tablet Take 2 mg by mouth daily.  . cetirizine  (ZYRTEC) 10 MG tablet Take 10 mg by mouth daily.    Marland Kitchen escitalopram (LEXAPRO) 20 MG tablet TAKE 1 TABLET BY MOUTH DAILY.  Marland Kitchen levothyroxine (SYNTHROID, LEVOTHROID) 150 MCG tablet TAKE 1 TABLET BY MOUTH DAILY BEFORE BREAKFAST  . lisinopril (PRINIVIL,ZESTRIL) 5 MG tablet Take 1 tablet (5 mg total) by mouth daily.  Marland Kitchen MAGNESIUM GLYCINATE PLUS PO Take 800 mg by mouth.  . metFORMIN (GLUCOPHAGE-XR) 500 MG 24 hr tablet TAKE 2 TABLETS BY MOUTH DAILY  . propafenone (RYTHMOL SR) 225 MG 12 hr capsule Take 1 capsule (225 mg total) by mouth 2 (two) times daily.  . rivaroxaban (XARELTO) 20 MG TABS tablet Take 1 tablet (20 mg total) by mouth daily with supper.  Marland Kitchen spironolactone (ALDACTONE) 50 MG tablet Take 50 mg by mouth daily.   Allergies  Allergen Reactions  . Benadryl [Diphenhydramine Hcl]     itching  . Fish Oil     Makes hands raw, mouth and female area   . Influenza Vaccines     Gives pt the flu and site of injection swells and hot  . Latex     Burns skin  . Other Nausea Only    opiates  . Tape     Burns skin, band-aid, ekg stickers  . Sulfa  Antibiotics Rash   Past Medical History:  Diagnosis Date  . Allergic rhinitis   . Anemia   . Atrial fibrillation ([place])    2006  . Bronchial breathing   . Chronic anxiety   . Depression   . Diabetes mellitus without complication ([name])   . Dysrhythmia   . Elevated blood pressure    On therapy. Probably hypertension  . Hx of cardiovascular stress test    a. Myoview 10/13:  no ischemia; EF 57%  . Hypothyroid   . Menorrhagia    2011  . Obesity (BMI 30-39.9)   . OSA (obstructive sleep apnea)    on CPAP  . PCOS (polycystic ovarian syndrome)   . PONV (postoperative nausea and vomiting)    C/o motion sickness  . Prediabetes   . Vitamin D deficiency    Past Surgical History:  Procedure Laterality Date  . BLADDER SURGERY  1997   interstitial cystitis   . BREAST SURGERY     reduction  . DILATION AND CURETTAGE, DIAGNOSTIC / THERAPEUTIC    .  ENDOMETRIAL ABLATION    . ENDOMETRIAL ABLATION  08/2010  . HAND SURGERY  2006   due to cat bite  . KNEE ARTHROSCOPY WITH MEDIAL MENISECTOMY Left 04/06/2015   Procedure: LEFT KNEE ARTHROSCOPY WITH PARTIAL MEDIAL MENISCECTOMY;  Surgeon: Leandrew Koyanagi, MD;  Location: [facility];  Service: Orthopedics;  Laterality: Left;  . OVARIAN CYST REMOVAL  04/28/13  . RENAL BIOPSY, OPEN  2006  . TONSILLECTOMY  2007   Family History  Problem Relation Age of Onset  . Cardiomyopathy Father   . Heart disease Father   . Hyperlipidemia Father   . Hypertension Father   . Cardiomyopathy Brother   . Hypertension      fathers side  . Hyperlipidemia      fathers side  . Heart failure      both grandparents - paternal  . Coronary artery disease Maternal Grandmother   . Heart attack Maternal Grandmother     and other maternal family members  . Heart attack Maternal Grandfather    Social History   Social History  . Marital status: Single    Spouse name: Christell Faith  . Number of children: N/A  . Years of education: associates in IT   Occupational History  . [employer]   Social History Main Topics  . Smoking status: Former Smoker    Packs/day: 2.00    Years: 16.00    Types: Cigarettes    Quit date: 09/03/1998  . Smokeless tobacco: Never Used  . Alcohol use Yes     Comment: maybe 2 a mth  . Drug use:     Types: Marijuana     Comment: occasional  . Sexual activity: Yes   Other Topics Concern  . Not on file   Social History Narrative   Long term partner, Ortencia Kick from [place]   Has lived in [place] since age 21 yo.       Review of Systems     Objective:   Physical Exam  NAD HEENT:  PERRL, EOMI, TMs pearly gray, throat without injection Neck:  Supple, No adenopathy Chest:  CTA CV:  RRR with normal S1 and S2, No S3, S4 or murmur,  radial pulses normal and equal.   Abd:  S, NT, No HSM or mass, + BS LE: No edema,  DPpulses normal and equal  Assessment & Plan:  1.  Paroxysmal Atrial Fibrillation:  Pt. Has another refill on Xarelto at the [facility] [place].   Checked with MAP.  She should be able to get the coupons from the pharmaceutical company's ICP program and take those to another pharmacy.  She does not have to transfer to MAP.  Call into [place] to clarify for patient. Continues on Propafenone twice daily Also, discussed daily physical activity.  To start with short bike ride or stationary bike( to avoid sudden exertion going up hills) and gradually increase her time on the bike rather than going on long bike ride every 3 days, with need to rest the following 2 days. Asked that she discuss possibility of cardiac rehab with Dr. Caryl Comes, where her cardio respiratory status can be assessed during and after to give her a better idea of limitations. CMP today  2.  Proteinuria:  UA today.  Check records.  Switch to ARB Losartan at [facility] with her cough.  3.  PCOS/DM/Metabolic Syndrome:  Check A1C.  Sounds like this has been well controlled.  Fill Metformin, spironolactone  4.  Depression:  Continue Escitalopram--Can get at [place] at lower cost.  5.  Cough: possibly ACE I cough--switch to Losartan.  BP check in 1 month.  6.  Hypothyroidism:  TSH today

## 2016-05-21 NOTE — Patient Instructions (Signed)
Check with Dr. Caryl Comes about the possibility of Cardiac rehab evaluation while on a stationary bike.

## 2016-05-22 LAB — COMPREHENSIVE METABOLIC PANEL
A/G RATIO: 1.8 (ref 1.2–2.2)
ALT: 22 IU/L (ref 0–32)
AST: 19 IU/L (ref 0–40)
Albumin: 4.4 g/dL (ref 3.5–5.5)
Alkaline Phosphatase: 65 IU/L (ref 39–117)
BUN/Creatinine Ratio: 10 (ref 9–23)
BUN: 9 mg/dL (ref 6–24)
Bilirubin Total: 0.3 mg/dL (ref 0.0–1.2)
CALCIUM: 9.1 mg/dL (ref 8.7–10.2)
CO2: 24 mmol/L (ref 18–29)
Chloride: 99 mmol/L (ref 96–106)
Creatinine, Ser: 0.89 mg/dL (ref 0.57–1.00)
GFR calc non Af Amer: 78 mL/min/{1.73_m2} (ref >59)
GFR, EST AFRICAN AMERICAN: 90 mL/min/{1.73_m2} (ref >59)
Globulin, Total: 2.5 g/dL (ref 1.5–4.5)
Glucose: 82 mg/dL (ref 65–99)
POTASSIUM: 4.4 mmol/L (ref 3.5–5.2)
Sodium: 138 mmol/L (ref 134–144)
TOTAL PROTEIN: 6.9 g/dL (ref 6.0–8.5)

## 2016-05-22 LAB — LIPID PANEL W/O CHOL/HDL RATIO
CHOLESTEROL TOTAL: 198 mg/dL (ref 100–199)
HDL: 32 mg/dL — ABNORMAL LOW (ref >39)
LDL Calculated: 124 mg/dL — ABNORMAL HIGH (ref 0–99)
TRIGLYCERIDES: 212 mg/dL — AB (ref 0–149)
VLDL Cholesterol Cal: 42 mg/dL — ABNORMAL HIGH (ref 5–40)

## 2016-05-22 LAB — TSH: TSH: 3.41 u[IU]/mL (ref 0.450–4.500)

## 2016-05-22 LAB — HGB A1C W/O EAG: Hgb A1c MFr Bld: 5.8 % — ABNORMAL HIGH (ref 4.8–5.6)

## 2016-05-22 MED ORDER — SPIRONOLACTONE 50 MG PO TABS: ORAL_TABLET | ORAL | 11 refills | Status: DC

## 2016-05-22 MED ORDER — LEVOTHYROXINE SODIUM 150 MCG PO TABS: 150.0000 ug | ORAL_TABLET | Freq: Every day | ORAL | 11 refills | Status: DC

## 2016-05-22 MED ORDER — METFORMIN HCL ER 500 MG PO TB24: 1000.0000 mg | ORAL_TABLET | Freq: Every day | ORAL | 11 refills | Status: DC

## 2016-05-23 ENCOUNTER — Other Ambulatory Visit: Payer: Self-pay | Admitting: Internal Medicine

## 2016-05-25 ENCOUNTER — Telehealth: Payer: Self-pay | Admitting: Internal Medicine

## 2016-05-25 MED ORDER — ESCITALOPRAM OXALATE 20 MG PO TABS: 20.0000 mg | ORAL_TABLET | Freq: Every day | ORAL | 11 refills | Status: AC

## 2016-05-25 NOTE — Telephone Encounter (Signed)
I spoke with [facility] MAP program and was told that the Rythmol can be applied for through patient assistance there but they will have to do an appeal to take it over from [facility]. The cannot help any patient with [facility] on their OC but the patient's Orange card does say [place]. The patient has 15 days left of that medication. She says she does not have a Office manager card for [employer]. She says she was not getting them free but at a lower cost. She is calling [place] about the Rythmol patient assistance now. It was explained to her that she cannot continue to use that pharmacy because she is no longer a patient there and [facility] is listed on her OC.

## 2016-05-28 ENCOUNTER — Other Ambulatory Visit: Payer: Self-pay | Admitting: Internal Medicine

## 2016-05-28 MED ORDER — PROPAFENONE HCL ER 225 MG PO CP12
225.0000 mg | ORAL_CAPSULE | Freq: Two times a day (BID) | ORAL | 11 refills | Status: DC
Start: 2016-05-28 — End: 2016-07-12

## 2016-05-28 MED ORDER — RIVAROXABAN 20 MG PO TABS: 20.0000 mg | ORAL_TABLET | Freq: Every day | ORAL | 11 refills | Status: DC

## 2016-05-28 NOTE — Telephone Encounter (Signed)
I thought the medications were already sent to MAP when I spoke with them Friday. But they appear to be sent now. The patient told me she was going this Thursday to sign up for MAP.

## 2016-05-28 NOTE — Telephone Encounter (Signed)
Did you send in the refills on Friday to MAP as we discussed?

## 2016-06-04 ENCOUNTER — Telehealth: Payer: Self-pay | Admitting: Internal Medicine

## 2016-06-04 NOTE — Telephone Encounter (Signed)
Patient would like Dr. Amil Amen to know that she went to MAP and completed her paperwork needed for her to receive service.

## 2016-06-05 ENCOUNTER — Telehealth: Payer: Self-pay | Admitting: Internal Medicine

## 2016-06-05 NOTE — Telephone Encounter (Signed)
rivaroxaban (XARELTO) 20 MG TABS tablet  Medication  Date: 05/28/2016 Department: [facility] Ordering/Authorizing: Mack Hook, MD  Order Providers   Prescribing Provider Encounter Provider  Mack Hook, MD Mack Hook, MD  Medication Detail    Disp Refills Start End   rivaroxaban (XARELTO) 20 MG TABS tablet 30 tablet 11 05/28/2016    Sig - Route: Take 1 tablet (20 mg total) by mouth daily with supper. - Oral   E-Prescribing Status: Receipt confirmed by pharmacy (05/28/2016 4:05 PM EDT)   Pharmacy   [place] [place] [facility] [place]

## 2016-06-05 NOTE — Telephone Encounter (Signed)
New message    Patient calling the office for samples of medication:   1.  What medication and dosage are you requesting samples for? Xarelto 20 mg and Rhythmol 225 mg  2.  Are you currently out of this medication? Will be out Monday

## 2016-06-06 NOTE — Telephone Encounter (Signed)
Refill team:  Can you please check to see if any samples available for Xarelto 20 mg daily for this patient?  I don't think we have rhythmol samples.  I am in [place] all day today- I would greatly appreciate you checking on this for the patient. If samples available, please pull for the patient and contact her to let her know.  Thanks!

## 2016-06-06 NOTE — Telephone Encounter (Signed)
I have one bottle of xarelto for the patient. I have made several attempts to call her but have not been successful.

## 2016-06-07 NOTE — Telephone Encounter (Signed)
Patient aware xarelto samples will be placed at the front desk for pick up. She stated that the health department is helping her in trying to obtain assistance with the xarelto and rythmol, but it will take 4-6 weeks.

## 2016-06-08 ENCOUNTER — Telehealth: Payer: Self-pay | Admitting: Internal Medicine

## 2016-06-08 MED FILL — PROPAFENONE HCL ER 225 MG C: 225 | 30 days supply | Qty: 60 | Fill #2 | Status: TO

## 2016-06-08 MED FILL — PROPAFENONE HCL ER 225 MG C: 225 | 30 days supply | Qty: 60 | Fill #0

## 2016-06-08 NOTE — Telephone Encounter (Signed)
New message  Pt call requesting to speak with RN. Pt states she is aware that she has some Xarelto samples ready for pick up. Pt states she would like to speak with RN you ask questions about the med. Please call back to discuss

## 2016-06-08 NOTE — Telephone Encounter (Signed)
I called and spoke with the patient. She is questioning why Dr. Caryl Comes has her on the highest dose of Xarelto.] I advised her that dosing for these particular types of medications is based on age/ weight/ renal function. She states "I am very sensitive to medicaitions and Dr. Caryl Comes knows that." She is asking that she be placed on a lower dose due to gum bleeding, spotting for 3 days after her period, and the fact that Eliquis caused her to have protein in her urine. I advised her I will review with Dr. Caryl Comes and call her back next week. She is agreeable.

## 2016-06-08 NOTE — Telephone Encounter (Signed)
Pervious message for RN

## 2016-06-11 ENCOUNTER — Other Ambulatory Visit: Payer: Self-pay | Admitting: Internal Medicine

## 2016-06-11 MED ORDER — RIVAROXABAN 15 MG PO TABS: 15.0000 mg | ORAL_TABLET | Freq: Every day | ORAL | 3 refills | Status: DC

## 2016-06-11 MED FILL — XARELTO 15 MG TABLET: 15 | 30 days supply | Qty: 30 | Fill #0

## 2016-06-11 NOTE — Telephone Encounter (Signed)
This is a really difficult issue I agree with HM that the pt is on the correct dose; we have data from other NOACs that lower than standard dosing is assoc with increased risk of stroke.  We also know that young woman with hypertension are a relatively low risk group for which guidelines still  Recommend therapy, say compared to older men with HTN who have "same score" There is data for increased risk of venous clots with her polycystic ovarian syndrome, although could not find data about assoc with atrial fib. Her HgbA1c is prediabetes;  Diabetes would certainly increase her score and her stroke risk;  It is not clear to me whether her prediabetes increases her risk or not, although my bias is that it will, but to a lesser degree than diabetes Her bleeding is of concern  There is no right answer>> we are balancing potential benefits with risks.  If she would like to take less NOAC with the info above, we can certainly decrease her dose to 15

## 2016-06-11 NOTE — Telephone Encounter (Signed)
I spoke with patient and read her Dr. Olin Pia message.  She voiced understanding and states she would like to decrease dose of Xarelto to 15mg .  I sent rx to [facility] at patient's request. She voiced thanks.

## 2016-06-18 ENCOUNTER — Telehealth: Payer: Self-pay | Admitting: Internal Medicine

## 2016-06-18 NOTE — Telephone Encounter (Signed)
Desiree Schultz is calling because she was prescribed a new medication from [place] and wants to know if she needs a f/u visit before Jan..Please call patient.Thanks

## 2016-06-19 NOTE — Telephone Encounter (Signed)
I called and spoke with the patient.  She was requesting a visit with Dr. Caryl Comes as soon as possible. I advised her the first thing I could offer her is not until 11/17. She is asking if Dr. Caryl Comes could just call her. She is needing a letter of "medical neccessity" for a gastric sleeve to be done. She reports she recently saw Roderic Palau, NP and they discussed ablation for her a-fib as she is on her last medications as options. She discussed with Butch Penny, that she may want to pursue weight loss first.  I have advised her I will review with Dr. Caryl Comes and ask that he call her. She is available on tues/ thurs after 3pm and mon/ fri all day. I advised I would try to have Dr. Caryl Comes call her back tonight, but it would be after 5:30 pm.

## 2016-06-19 NOTE — Telephone Encounter (Signed)
I spoke with Dr. Terri Skains. He suggested the patient talked to [place] (spelling) at [facility] surgery. 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 He said he would look into cone orange card coverage

## 2016-06-21 MED FILL — ?LISINOPRIL 5 MG TABLET: 5 | 30 days supply | Qty: 30 | Fill #9

## 2016-06-21 MED FILL — ?ESCITALOPRAM 20 MG TABLET: 20 | 30 days supply | Qty: 30 | Fill #3

## 2016-06-21 NOTE — Telephone Encounter (Signed)
I called and spoke with the patient and made her aware of Dr. Olin Pia recommendations per Dr. Lucia Gaskins.  She states that she already called and spoke with [place] at [facility]. I advised her at this point, that I am not sure what needs to be done. I explained to her I will call Marita Kansas and speak with her.    I attempted to call Marita Kansas at [facility]- [phone]) [phone] to inquire about the patient having surgery and what they would need from Dr. Caryl Comes. I left a message on her voice mail to call me back today and if I don't hear from her I will try her back on Monday.

## 2016-06-21 NOTE — Telephone Encounter (Signed)
I left a message for the patient to call. 

## 2016-06-21 NOTE — Telephone Encounter (Signed)
I spoke with [place]. She advised that they do need a letter of medical necessity for bariatric surgery. This is most often done through the PCP office. She will send me a sample letter for medical necessity. I will review with Dr. Caryl Comes again when he returns to the office the week of 07/02/16.

## 2016-06-28 ENCOUNTER — Telehealth: Payer: Self-pay | Admitting: *Deleted

## 2016-06-28 NOTE — Telephone Encounter (Signed)
Xarelto 20mg  samples placed back in the sample closet.

## 2016-07-02 ENCOUNTER — Telehealth: Payer: Self-pay | Admitting: Internal Medicine

## 2016-07-02 NOTE — Telephone Encounter (Signed)
Wants to know if Dr. Amil Amen has read her email regarding her eye exam.  Can Dr. Amil Amen can increase patients Metformin or put her on a Rx that will lower her blood sugar.  Patient can be reached at [phone].  If Dr. Does not have have time to call please email her at [email or web address].

## 2016-07-03 ENCOUNTER — Ambulatory Visit (INDEPENDENT_AMBULATORY_CARE_PROVIDER_SITE_OTHER): Payer: Self-pay | Admitting: Internal Medicine

## 2016-07-03 VITALS — BP 128/76 | HR 68 | Resp 17 | Ht 65.0 in | Wt 309.0 lb

## 2016-07-03 DIAGNOSIS — G4733 Obstructive sleep apnea (adult) (pediatric): Secondary | ICD-10-CM

## 2016-07-03 DIAGNOSIS — R809 Proteinuria, unspecified: Secondary | ICD-10-CM

## 2016-07-03 DIAGNOSIS — R7303 Prediabetes: Secondary | ICD-10-CM

## 2016-07-03 DIAGNOSIS — Z87448 Personal history of other diseases of urinary system: Secondary | ICD-10-CM

## 2016-07-03 DIAGNOSIS — H547 Unspecified visual loss: Secondary | ICD-10-CM

## 2016-07-03 LAB — POCT URINALYSIS DIPSTICK
BILIRUBIN UA: NEGATIVE
Blood, UA: NEGATIVE
GLUCOSE UA: NEGATIVE
Ketones, UA: NEGATIVE
LEUKOCYTES UA: NEGATIVE
NITRITE UA: NEGATIVE
PH UA: 6
Protein, UA: NEGATIVE
Spec Grav, UA: 1.01
Urobilinogen, UA: NEGATIVE

## 2016-07-03 NOTE — Progress Notes (Signed)
Subjective:    Patient ID: Desiree Schultz, female    DOB: 03/11/1971, 45 y.o.   MRN: [id number]  HPI   1.  Concerned she may have proteinuria again and would like to have this checked.  Had with Eliquus.  Multaq also.  UA checked and negative for protein.  2.  Needs eyecheck.  3.  Lack of physical activity:  Has not talked with PA at Cardiologist about cardiac rehab.  Patient exhausted trying to ride bike even on small hills.  States too expensive to be in a stationary bike class.  Not clear how interested she is in applying for [facility] scholarship. She feels she is fatigued due to medication, not due to lack of motivation or depression. Waiting on Cardiology to see about gastric sleeve for weight loss.  4.  On ARB patient states for proteinuria--then later for edema after proteinuria, which she states was caused by the Eliquus, resolved.  5. Prediabetes:  Checking sugars and over 100.  A1C was 5.8%O  6.  OSA--uses CPAP regularly  CPE with pap already this year.  Current Meds  Medication Sig  . Ascorbic Acid (VITAMIN C WITH ROSE HIPS) 1000 MG tablet Take 2 mg by mouth daily.  . cetirizine (ZYRTEC) 10 MG tablet Take 1 tablet (10 mg total) by mouth daily.  Marland Kitchen escitalopram (LEXAPRO) 20 MG tablet Take 1 tablet (20 mg total) by mouth daily.  Marland Kitchen levothyroxine (SYNTHROID) 150 MCG tablet Take 1 tablet (150 mcg total) by mouth daily before breakfast.  . losartan (COZAAR) 25 MG tablet Take 1 tablet (25 mg total) by mouth daily.  . metFORMIN (GLUCOPHAGE-XR) 500 MG 24 hr tablet Take 2 tablets (1,000 mg total) by mouth daily.  . propafenone (RYTHMOL SR) 225 MG 12 hr capsule Take 1 capsule (225 mg total) by mouth 2 (two) times daily.  . Rivaroxaban (XARELTO) 15 MG TABS tablet Take 1 tablet (15 mg total) by mouth daily with supper.  Marland Kitchen spironolactone (ALDACTONE) 50 MG tablet 1/2 tab by mouth daily in morning  . [DISCONTINUED] levothyroxine (SYNTHROID, LEVOTHROID) 150 MCG tablet Take 1 tablet (150 mcg  total) by mouth daily before breakfast.  . [DISCONTINUED] MAGNESIUM GLYCINATE PLUS PO Take 800 mg by mouth.  . [DISCONTINUED] propafenone (RYTHMOL SR) 225 MG 12 hr capsule Take 1 capsule (225 mg total) by mouth 2 (two) times daily.     Allergies  Allergen Reactions  . Multaq [Dronedarone] Swelling    Per patient  . Benadryl [Diphenhydramine Hcl]     itching  . Fish Oil     Makes hands raw, mouth and female area   . Influenza Vaccines     Gives pt the flu and site of injection swells and hot  . Latex     Burns skin  . Other Nausea Only    opiates  . Tape     Burns skin, band-aid, ekg stickers  . Sulfa Antibiotics Rash      Review of Systems     Objective:   Physical Exam  NAD Lungs:  CTA CV:  RRR with normal S1 and S2, No S3, S4, or murmur, radial and DP pulses normal and equal. LE:  No edema        Assessment & Plan:  1. Decreased Visual Acuity:  Eye referral.  2.  History of proteinuria, reportedly drug related.  No findings of such on UA today.  3.  Lack of physical activity due to fatigue:  Not clear how  motivated patient is.  Discussed [employer] scholarship, finding places to ride her bike without significant hills, stationary bike and cardiac rehab.    She is also looking at bariatric surgery for weight loss.  4.  Prediabetes: Have not seen an A1C in her chart above 6.1%.  Long discussion regarding diet, and physical activity as above.  Cholesterol also elevated triglycerides, low HDL and borderline LDL, consistent with metabolic syndrome.  Again, needs to find a way to gradually increase physical activity and improve diet.  5.  OSA;  Reportedly using CPAP regularly  6.  Decreased visual acuity:  Optometry referral

## 2016-07-03 NOTE — Patient Instructions (Signed)
Get out and ride on flat surface a little every day.

## 2016-07-05 NOTE — Telephone Encounter (Signed)
Discussed with patient at her visit that I could not find the email she wrote.  Better just to call office

## 2016-07-09 ENCOUNTER — Other Ambulatory Visit: Payer: Self-pay | Admitting: Internal Medicine

## 2016-07-09 ENCOUNTER — Telehealth: Payer: Self-pay | Admitting: Internal Medicine

## 2016-07-09 NOTE — Telephone Encounter (Signed)
The health department does not have heart medication (Propafenone (RYTHMOL SR) 225 mg 12 hr capsule).  Patient wants to know if Dr. Amil Amen would call in Rx into [facility], to see if they will let her have 30 or 60 day supply, just until her MAP is processed for that drug.   Patient only has enough Rx for one day (tomorrow).  Patient can be contacted at [phone].  Patient stated that this is urgent!

## 2016-07-10 NOTE — Telephone Encounter (Signed)
Pat received a call from [place] [facility] [place] spoke with representative at [employer] regarding patients Rx.  [employer] M[employer] made an error in the order and processed Rythmol immediate release instead of extended release.  Dawn stated that [employer] will now expedite the order, the request takes 24 hours as the order is now approved.  The Rx should arrive on Thursday to MAP, however if it does not arrive on Thursday, MAP is closed on Friday for Veteran's Day, the Rx would not be available until Monday.

## 2016-07-10 NOTE — Telephone Encounter (Signed)
Check with patient :  She was supposed to have completed the application for this at [facility] or to go to MAP and apply through the pharmaceutical company [facility])

## 2016-07-10 NOTE — Telephone Encounter (Signed)
Fraser Din spoke with Dawn at the [facility] [phone]).  Dawn has called [facility] regarding medication for patient.  Dawn was told that they Biomedical engineer [place]) recently removed Rx from their website.  Rx is no longer available.    Dawn will call [employer] back this morning (07/10/2016) to see if she can more information as to how our patient can get the Rx.   Mathilda Deflorio stated that she could not get the medication from her cardiologist because they do not have any samples.

## 2016-07-10 NOTE — Telephone Encounter (Signed)
Called and spoke with Dawn at [place] as well.  Notified patient we would find out tomorrow.   Also called [facility] to see if they could float patient samples until she can get through MAP.

## 2016-07-10 NOTE — Telephone Encounter (Signed)
Dawn of MAP called and spoke with [employer].  The person who previously processed the patient's application did not process correctly.  The application was processed with Rythmol immediate release NOT extended release.    The Rx request will now be expedited and process within 24 hours.  The Rx should arrive at MAP by Thrusday, if it does not MAP is closed for Veteran's Day on Friday, then the Rx will be available on Monday.

## 2016-07-11 MED FILL — PROPAFENONE HCL ER 225 MG C: 225 | 30 days supply | Qty: 60 | Fill #1

## 2016-07-12 ENCOUNTER — Telehealth: Payer: Self-pay | Admitting: Internal Medicine

## 2016-07-12 MED ORDER — PROPAFENONE HCL ER 225 MG PO CP12: 225.0000 mg | ORAL_CAPSULE | Freq: Two times a day (BID) | ORAL | 0 refills | Status: DC

## 2016-07-12 NOTE — Addendum Note (Signed)
Addended by: Marcelino Duster on: 07/12/2016 05:29 PM   Modules accepted: Orders

## 2016-07-12 NOTE — Telephone Encounter (Signed)
LVM with information.

## 2016-07-12 NOTE — Telephone Encounter (Signed)
ERROR

## 2016-07-12 NOTE — Telephone Encounter (Signed)
Patient called back and is aware of Rx sent to [facility] on [address]. Informed she needs to have voucher and to call us if any problems

## 2016-07-12 NOTE — Addendum Note (Signed)
Addended by: Marcelino Duster on: 07/12/2016 12:35 PM   Modules accepted: Orders

## 2016-07-13 ENCOUNTER — Telehealth: Payer: Self-pay | Admitting: Internal Medicine

## 2016-07-13 NOTE — Telephone Encounter (Signed)
I spoke with the patient and advised her that Dr. Caryl Comes will work on a letter for her in regards to her bariatric surgery. Sample letters received from [facility]. They are requesting information regarding attempts at weight loss and the dates of these attempts. The patient is not home at this time, but she will call me back today with this information.

## 2016-07-13 NOTE — Telephone Encounter (Signed)
See previous phone encounter dated 06/18/16.

## 2016-07-13 NOTE — Telephone Encounter (Signed)
New message  Pt wants to know if results of bariatric surgery have been sent  Please call back

## 2016-07-16 ENCOUNTER — Telehealth: Payer: Self-pay | Admitting: Internal Medicine

## 2016-07-16 MED ORDER — LEVOTHYROXINE SODIUM 150 MCG PO TABS: 150.0000 ug | ORAL_TABLET | Freq: Every day | ORAL | 11 refills | Status: DC

## 2016-07-16 MED ORDER — MOMETASONE FUROATE 50 MCG/ACT NA SUSP: NASAL | 12 refills | Status: DC

## 2016-07-18 NOTE — Telephone Encounter (Signed)
LVM to return call.

## 2016-07-20 ENCOUNTER — Ambulatory Visit (HOSPITAL_COMMUNITY)
Admission: RE | Admit: 2016-07-20 | Discharge: 2016-07-20 | Disposition: A | Discharge status: Home / Self Care | Payer: No Typology Code available for payment source | Source: Ambulatory Visit | Attending: Nurse Practitioner | Admitting: Nurse Practitioner

## 2016-07-20 ENCOUNTER — Encounter (HOSPITAL_COMMUNITY): Payer: Self-pay | Admitting: Nurse Practitioner

## 2016-07-20 VITALS — BP 118/78 | HR 63 | Ht 65.0 in | Wt 307.0 lb

## 2016-07-20 DIAGNOSIS — F329 Major depressive disorder, single episode, unspecified: Secondary | ICD-10-CM | POA: Insufficient documentation

## 2016-07-20 DIAGNOSIS — J309 Allergic rhinitis, unspecified: Secondary | ICD-10-CM | POA: Insufficient documentation

## 2016-07-20 DIAGNOSIS — E669 Obesity, unspecified: Secondary | ICD-10-CM | POA: Insufficient documentation

## 2016-07-20 DIAGNOSIS — I480 Paroxysmal atrial fibrillation: Secondary | ICD-10-CM | POA: Insufficient documentation

## 2016-07-20 DIAGNOSIS — E119 Type 2 diabetes mellitus without complications: Secondary | ICD-10-CM | POA: Insufficient documentation

## 2016-07-20 DIAGNOSIS — Z8249 Family history of ischemic heart disease and other diseases of the circulatory system: Secondary | ICD-10-CM | POA: Insufficient documentation

## 2016-07-20 DIAGNOSIS — G4733 Obstructive sleep apnea (adult) (pediatric): Secondary | ICD-10-CM | POA: Insufficient documentation

## 2016-07-20 DIAGNOSIS — Z79899 Other long term (current) drug therapy: Secondary | ICD-10-CM | POA: Insufficient documentation

## 2016-07-20 DIAGNOSIS — E039 Hypothyroidism, unspecified: Secondary | ICD-10-CM | POA: Insufficient documentation

## 2016-07-20 DIAGNOSIS — Z7984 Long term (current) use of oral hypoglycemic drugs: Secondary | ICD-10-CM | POA: Insufficient documentation

## 2016-07-20 DIAGNOSIS — Z87891 Personal history of nicotine dependence: Secondary | ICD-10-CM | POA: Insufficient documentation

## 2016-07-20 DIAGNOSIS — Z9889 Other specified postprocedural states: Secondary | ICD-10-CM | POA: Insufficient documentation

## 2016-07-20 DIAGNOSIS — Z888 Allergy status to other drugs, medicaments and biological substances status: Secondary | ICD-10-CM | POA: Insufficient documentation

## 2016-07-20 DIAGNOSIS — Z7901 Long term (current) use of anticoagulants: Secondary | ICD-10-CM | POA: Insufficient documentation

## 2016-07-20 DIAGNOSIS — E559 Vitamin D deficiency, unspecified: Secondary | ICD-10-CM | POA: Insufficient documentation

## 2016-07-20 DIAGNOSIS — E282 Polycystic ovarian syndrome: Secondary | ICD-10-CM | POA: Insufficient documentation

## 2016-07-20 NOTE — Telephone Encounter (Signed)
I spoke with the patient this afternoon. She will need to call me back in about an hour with updated info. Will await a call back from the patient.

## 2016-07-20 NOTE — Progress Notes (Signed)
Patient ID: Desiree Schultz, female   DOB: 1970-09-09, 45 y.o.   MRN: [id number]     Primary Care Physician: Mack Hook, MD Referring Physician: Dr. Jolyn Nap   Desiree Schultz is a 45 y.o. female with a h/o afib in the setting of polycystic ovarian syndrome, in the afib clinic to be evaluated for issues with afib and side effects of Rythmol. She has failed flecainide due to fatigue and most recently multaq was stopped due to significant pedal edema. She has been recently started on Rythmol 150 mg bid with h/o of fatigue, slow heart rate with Rythmol just once a day. Has issues with metoprolol slowing her heart rate and making her feel fatigued with exercise. Since she was started on Rythmol 150 mg bid  7/19, she has had daily episodes of PAF. To offset this, she added back metoprolol ER 25 mg 1/2 tab, which helps to control the afib but makes her feel so weak that she feels that she just cant find the strength and can't get her HR up enough to be able to function well on the bike. She denies drinking any alcohol, no tobacco, no decaf, has OSA and wears CPAP religiously. 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 Ultimately, she may be an candidate for Tikosyn or ablation(weight though will lessen chances of success). On xarelto, for CHA2DS2VASc score of at least 3.  She returns 11/17 and feels well. She has only had one episode of afib that occurred when she was on vacation and had consumed alcohol, caffeine and eaten fried chicken when it came on. It lasted 15 mins and broke on its own but the pt felt faint during the episode. She is in the beginning stages of trying to get Bariatric surgery and is working with Dr. Caryl Comes for the necessary initial steps. steps.  Today, she denies symptoms of  chest pain, shortness of breath, orthopnea, PND, lower extremity edema, dizziness, presyncope, syncope, or neurologic sequela. Positive  for fatigue, palpitations.  The patient is tolerating medications without difficulties and is otherwise without complaint today.   Past Medical History:  Diagnosis Date  . Allergic rhinitis   . Anemia   . Atrial fibrillation ([place])    2006  . Bronchial breathing   . Chronic anxiety   . Depression   . Diabetes mellitus without complication ([place])   . Dysrhythmia   . Elevated blood pressure    On therapy. Probably hypertension  . Hx of cardiovascular stress test    a. Myoview 10/13:  no ischemia; EF 57%  . Hypothyroid   . Menorrhagia    2011  . Obesity (BMI 30-39.9)   . OSA (obstructive sleep apnea)    on CPAP  . PCOS (polycystic ovarian syndrome)   . PONV (postoperative nausea and vomiting)    C/o motion sickness  . Prediabetes   . Vitamin D deficiency    Past Surgical History:  Procedure Laterality Date  . BLADDER SURGERY  1997   interstitial cystitis   . BREAST SURGERY     reduction  . DILATION AND CURETTAGE, DIAGNOSTIC / THERAPEUTIC    . ENDOMETRIAL ABLATION    . ENDOMETRIAL ABLATION  08/2010  . HAND SURGERY  2006   due to cat bite  . KNEE ARTHROSCOPY WITH MEDIAL MENISECTOMY Left 04/06/2015   Procedure: LEFT KNEE ARTHROSCOPY WITH PARTIAL MEDIAL MENISCECTOMY;  Surgeon: Leandrew Koyanagi, MD;  Location: [facility];  Service: Orthopedics;  Laterality: Left;  . OVARIAN CYST REMOVAL  04/28/13  . RENAL BIOPSY, OPEN  2006  . TONSILLECTOMY  2007    Current Outpatient Prescriptions  Medication Sig Dispense Refill  . Ascorbic Acid (VITAMIN C WITH ROSE HIPS) 1000 MG tablet Take 2 mg by mouth daily.    . cetirizine (ZYRTEC) 10 MG tablet Take 1 tablet (10 mg total) by mouth daily. 30 tablet 11  . escitalopram (LEXAPRO) 20 MG tablet Take 1 tablet (20 mg total) by mouth daily. 30 tablet 11  . levothyroxine (SYNTHROID) 150 MCG tablet Take 1 tablet (150 mcg total) by mouth daily before breakfast. 30 tablet 11  . losartan (COZAAR) 25 MG tablet Take 1 tablet (25 mg total) by mouth daily. 30  tablet 11  . Magnesium 400 MG CAPS Take 1 tablet by mouth daily.    . metFORMIN (GLUCOPHAGE-XR) 500 MG 24 hr tablet Take 2 tablets (1,000 mg total) by mouth daily. 60 tablet 11  . mometasone (NASONEX) 50 MCG/ACT nasal spray 2 sprays each nostril daily 17 g 12  . propafenone (RYTHMOL SR) 225 MG 12 hr capsule Take 1 capsule (225 mg total) by mouth 2 (two) times daily. 20 capsule 0  . Rivaroxaban (XARELTO) 15 MG TABS tablet Take 1 tablet (15 mg total) by mouth daily with supper. 90 tablet 3  . spironolactone (ALDACTONE) 50 MG tablet 1/2 tab by mouth daily in morning 15 tablet 11   No current facility-administered medications for this encounter.     Allergies  Allergen Reactions  . Multaq [Dronedarone] Swelling    Per patient  . Benadryl [Diphenhydramine Hcl]     itching  . Fish Oil     Makes hands raw, mouth and female area   . Influenza Vaccines     Gives pt the flu and site of injection swells and hot  . Latex     Burns skin  . Other Nausea Only    opiates  . Tape     Burns skin, band-aid, ekg stickers  . Sulfa Antibiotics Rash    Social History   Social History  . Marital status: Single    Spouse name: Christell Faith  . Number of children: N/A  . Years of education: associates in IT   Occupational History  . [employer]   Social History Main Topics  . Smoking status: Former Smoker    Packs/day: 2.00    Years: 16.00    Types: Cigarettes    Quit date: 09/03/1998  . Smokeless tobacco: Never Used  . Alcohol use Yes     Comment: maybe 2 a mth  . Drug use:     Types: Marijuana     Comment: occasional  . Sexual activity: Yes   Other Topics Concern  . Not on file   Social History Narrative   Long term partner, Ortencia Kick from [place]   Has lived in [place] since age 61 yo.       Family History  Problem Relation Age of Onset  . Cardiomyopathy Father   . Heart disease Father   .  Hyperlipidemia Father   . Hypertension Father   . Cardiomyopathy Brother   . Hypertension      fathers side  . Hyperlipidemia      fathers side  . Heart failure      both grandparents - paternal  . Coronary artery disease Maternal Grandmother   .  Heart attack Maternal Grandmother     and other maternal family members  . Heart attack Maternal Grandfather     ROS- All systems are reviewed and negative except as per the HPI above  Physical Exam: Vitals:   07/20/16 0912  BP: 118/78  Pulse: 63  Weight: (!) 307 lb (139.3 kg)  Height: 5\' 5"  (1.651 m)    GEN- The patient is well appearing, alert and oriented x 3 today.   Head- normocephalic, atraumatic Eyes-  Sclera clear, conjunctiva pink Ears- hearing intact Oropharynx- clear Neck- supple, no JVP Lymph- no cervical lymphadenopathy Lungs- Clear to ausculation bilaterally, normal work of breathing Heart- Regular rate and rhythm, no murmurs, rubs or gallops, PMI not laterally displaced GI- soft, NT, ND, + BS Extremities- no clubbing, cyanosis, or edema MS- no significant deformity or atrophy Skin- no rash or lesion Psych- euthymic mood, full affect Neuro- strength and sensation are intact  EKG- NSR at 63 bpm, pr int 172 ms, qrs int 78 ms, qtc 411 ms Epic records reviewed  Assessment and Plan: 1. Paroxysmal afib Failed flecainide, multaq Better success with long acting  Rythmol and metoprolol Continue Rythmol 225 mg SR bid  Ultimately, she may be able to try tikosyn or ablation Continue xarelto Continue CPAP   F/u with Dr. Caryl Comes in January  Butch Penny C. [place], [facility] [address] [place], [place] [address] [phone]

## 2016-07-20 NOTE — Telephone Encounter (Signed)
The patient called back with details about her weight loss attempts: 1) 1988- weight watchers (6 months) 2) 1992- hyponosis (6 months) 3) 1998- Slim fast (2 months)  4) 2000 & 2016- dietician recommendations  5) 2013- Atkins (3 months) 6) 2013- Ideal Protien (6 months) 7) last 15-16 years- liquid protein ( 4 attempts) 8) 2015- low calorie (6 months)   I advised the patient I will work with Dr. Caryl Comes to formulate a letter for her and I will call her when ready for pick up. She is agreeable.

## 2016-07-24 NOTE — Telephone Encounter (Signed)
Dr. Amil Amen spoke to patient

## 2016-07-25 NOTE — Telephone Encounter (Signed)
ERROR

## 2016-08-09 ENCOUNTER — Encounter: Payer: Self-pay | Admitting: Internal Medicine

## 2016-08-09 NOTE — Progress Notes (Signed)
Patient Care Team: Mack Hook, MD as PCP - General (Internal Medicine) Lorne Skeens, MD as Attending Physician (Endocrinology) Thompson Grayer, MD as Attending Physician (Cardiology) Deboraha Sprang, MD as Consulting Physician (Cardiology)   HPI  Desiree Schultz is a 45 y.o. female Seen in followup for atrial fibrillation occurring in the context of polycystic ovarian syndrome. She was started on apixaban. It was her impression that atrial fibrillation tracked with her period.  She had been treated with flecainide which we decreased from 100--50 mg twice daily for fatigue; this was done at her last visit. She she ended up on propafenone which she ended up taking ONCE Daily but it also is associated with fatigue as is her metoprolol.    She is also concerned about her TSH and thyroid replacement I had spoken with Dr. Forde Dandy. She ended up under  care of her PCP PA becoming hyperthyroid . 2015   CT scoring was 0.  2015 Echo LVEF normal with mild LVE/LAE  Without hypertrophy   She found that following taking her dronaderone she developed significant edema. After number of months she found that resolved when she stopped and recurred when she resumed. She is no longer taking it. She is taking propafenone once daily. The arrhythmia burden during her drug-free time was significant  Thromboembolic risk profile is notable for gender, hypertension    There is a questionable family history of cardiomyopathy  we have spoken in the past about genetic testing although I can't for the life of me figure out what prompted me to pursue this discussion with her based on what we know thus far  She takes aldactone for her PCOS and her blood pressures are well-controlled.        Past Medical History:  Diagnosis Date  . Allergic rhinitis   . Anemia   . Atrial fibrillation ([place])    2006  . Bronchial breathing   . Chronic anxiety   . Depression   . Diabetes mellitus without  complication ([place])   . Dysrhythmia   . Elevated blood pressure    On therapy. Probably hypertension  . Hx of cardiovascular stress test    a. Myoview 10/13:  no ischemia; EF 57%  . Hypothyroid   . Menorrhagia    2011  . Obesity (BMI 30-39.9)   . OSA (obstructive sleep apnea)    on CPAP  . PCOS (polycystic ovarian syndrome)   . PONV (postoperative nausea and vomiting)    C/o motion sickness  . Prediabetes   . Vitamin D deficiency     Past Surgical History:  Procedure Laterality Date  . BLADDER SURGERY  1997   interstitial cystitis   . BREAST SURGERY     reduction  . DILATION AND CURETTAGE, DIAGNOSTIC / THERAPEUTIC    . ENDOMETRIAL ABLATION    . ENDOMETRIAL ABLATION  08/2010  . HAND SURGERY  2006   due to cat bite  . KNEE ARTHROSCOPY WITH MEDIAL MENISECTOMY Left 04/06/2015   Procedure: LEFT KNEE ARTHROSCOPY WITH PARTIAL MEDIAL MENISCECTOMY;  Surgeon: Leandrew Koyanagi, MD;  Location: [facility];  Service: Orthopedics;  Laterality: Left;  . OVARIAN CYST REMOVAL  04/28/13  . RENAL BIOPSY, OPEN  2006  . TONSILLECTOMY  2007    Current Outpatient Prescriptions  Medication Sig Dispense Refill  . Ascorbic Acid (VITAMIN C WITH ROSE HIPS) 1000 MG tablet Take 2 mg by mouth daily.    . cetirizine (ZYRTEC) 10 MG tablet Take  1 tablet (10 mg total) by mouth daily. 30 tablet 11  . escitalopram (LEXAPRO) 20 MG tablet Take 1 tablet (20 mg total) by mouth daily. 30 tablet 11  . levothyroxine (SYNTHROID) 150 MCG tablet Take 1 tablet (150 mcg total) by mouth daily before breakfast. 30 tablet 11  . losartan (COZAAR) 25 MG tablet Take 1 tablet (25 mg total) by mouth daily. 30 tablet 11  . Magnesium 400 MG CAPS Take 1 tablet by mouth daily.    . metFORMIN (GLUCOPHAGE-XR) 500 MG 24 hr tablet Take 2 tablets (1,000 mg total) by mouth daily. 60 tablet 11  . mometasone (NASONEX) 50 MCG/ACT nasal spray 2 sprays each nostril daily 17 g 12  . propafenone (RYTHMOL SR) 225 MG 12 hr capsule Take 1 capsule (225 mg  total) by mouth 2 (two) times daily. 20 capsule 0  . Rivaroxaban (XARELTO) 15 MG TABS tablet Take 1 tablet (15 mg total) by mouth daily with supper. 90 tablet 3  . spironolactone (ALDACTONE) 50 MG tablet 1/2 tab by mouth daily in morning 15 tablet 11   No current facility-administered medications for this visit.     Allergies  Allergen Reactions  . Multaq [Dronedarone] Swelling    Per patient  . Benadryl [Diphenhydramine Hcl]     itching  . Fish Oil     Makes hands raw, mouth and female area   . Influenza Vaccines     Gives pt the flu and site of injection swells and hot  . Latex     Burns skin  . Other Nausea Only    opiates  . Tape     Burns skin, band-aid, ekg stickers  . Sulfa Antibiotics Rash    Review of Systems negative except from HPI and PMH  Physical Exam There were no vitals taken for this visit. Well developed and well nourished in no acute distress HENT normal E scleral and icterus clear Neck Supple JVP flat; carotids brisk and full Clear to ausculation  Regular rate and rhythm, no murmurs gallops or rub Soft with active bowel sounds No clubbing cyanosis  Edema Alert and oriented, grossly normal motor and sensory function Skin Warm and Dry  ECG  SR 62 20/08/41  Otherwise normal  Assessment and  Plan  Paroxysmal atrial fibrillation  Sinus bradycardia  Left ventricular enlargement  Hypothyroidism  Polycystic ovarian syndrome  Hypertension  Obstructive sleep apnea     To her credit She sorted out that the dronaderone was associated with edema and the apixoban with proteinuria.  Start her on Rivaroxaban as an anticoagulant.  We will increase her propafenone from 225 daily--150 twice a day. She will take her metoprolol as needed.  Her thyroid little bit out of whack. Her PCP is working on that.  We spent more than 50% of our >25 min visit in face to face counseling regarding the above

## 2016-08-09 NOTE — Telephone Encounter (Signed)
Letter done by Dr. Caryl Comes and mailed to the patient with an additional sheet of weights/ co-morbities sent to [place] from [facility].

## 2016-08-10 ENCOUNTER — Telehealth: Payer: Self-pay | Admitting: Internal Medicine

## 2016-08-10 NOTE — Telephone Encounter (Signed)
Mrs. Siebel is calling in reference to her receiving a letter from Dr. Caryl Comes and what is she to do next . Please call

## 2016-08-10 NOTE — Telephone Encounter (Signed)
Patient called and stated that she received a letter in My Chart from Dr. Caryl Comes yesterday about her having bariatric surgery. Patient wants to know if Dr. Caryl Comes has sent that information to the bariatric clinic. She states that she believes Dr. Caryl Comes was speaking to the surgeon directly and she wants to know what Dr. Caryl Comes would like for her to do now. Please advise.

## 2016-08-12 ENCOUNTER — Encounter: Payer: Self-pay | Admitting: Internal Medicine

## 2016-08-14 NOTE — Telephone Encounter (Signed)
Dr. Caryl Comes called the patient and left a message on her voice mail that the patient will need to take the paperwork that I mailed to her to the surgical center.

## 2016-08-14 NOTE — Telephone Encounter (Signed)
Have written her a supportive letter  She will need to take the information to the Surgery Clinic

## 2016-08-20 ENCOUNTER — Ambulatory Visit: Payer: Self-pay | Admitting: Internal Medicine

## 2016-08-24 ENCOUNTER — Other Ambulatory Visit: Payer: Self-pay | Admitting: *Deleted

## 2016-08-24 MED ORDER — RIVAROXABAN 15 MG PO TABS: 15.0000 mg | ORAL_TABLET | Freq: Every day | ORAL | 3 refills | Status: DC

## 2016-08-24 NOTE — Telephone Encounter (Signed)
PRINTED FOR PASS PROGRAM 

## 2016-08-30 ENCOUNTER — Ambulatory Visit (INDEPENDENT_AMBULATORY_CARE_PROVIDER_SITE_OTHER): Payer: Self-pay | Admitting: Internal Medicine

## 2016-08-30 ENCOUNTER — Telehealth: Payer: Self-pay | Admitting: Internal Medicine

## 2016-08-30 ENCOUNTER — Encounter: Payer: Self-pay | Admitting: Internal Medicine

## 2016-08-30 VITALS — BP 130/82 | HR 78 | Resp 14 | Ht 65.0 in | Wt 313.0 lb

## 2016-08-30 DIAGNOSIS — E119 Type 2 diabetes mellitus without complications: Secondary | ICD-10-CM | POA: Insufficient documentation

## 2016-08-30 DIAGNOSIS — Z780 Asymptomatic menopausal state: Secondary | ICD-10-CM

## 2016-08-30 DIAGNOSIS — E282 Polycystic ovarian syndrome: Secondary | ICD-10-CM

## 2016-08-30 DIAGNOSIS — G4733 Obstructive sleep apnea (adult) (pediatric): Secondary | ICD-10-CM

## 2016-08-30 DIAGNOSIS — I480 Paroxysmal atrial fibrillation: Secondary | ICD-10-CM

## 2016-08-30 DIAGNOSIS — R102 Pelvic and perineal pain: Secondary | ICD-10-CM

## 2016-08-30 LAB — GLUCOSE, POCT (MANUAL RESULT ENTRY): POC GLUCOSE: 113 mg/dL — AB (ref 70–99)

## 2016-08-30 NOTE — Telephone Encounter (Signed)
Left a message for the pt to call back.  

## 2016-08-30 NOTE — Patient Instructions (Signed)
We will look into CPAP equipment.

## 2016-08-30 NOTE — Telephone Encounter (Signed)
Pt calling to ask if Cardiac Rehab would accept her orange card and [facility] assistance services. Pt states she saw her PCP today and was advised to follow-up with Dr Caryl Comes and RN to be referred to Cardiac Rehab for fatigue and stamina issues (progress note from pts PCP Dr Amil Amen, noted in [facility]).  Pt wants to know if she would even be covered through her carrier's, before being referred first.  Advised the pt to call Cardiac Rehab and ask if they take her assistance programs she's enrolled with, then follow-up with Dr Caryl Comes and RN, to advise on placing the referral in the system if needed.  Pt verbalized understanding and agrees with this plan.

## 2016-08-30 NOTE — Telephone Encounter (Signed)
New Message  Pt voiced wanting to know about a cardiac rehab available for orange card patients.  Please f/u with pt

## 2016-08-30 NOTE — Progress Notes (Signed)
Subjective:    Patient ID: Desiree Schultz, female    DOB: 1971/03/23, 45 y.o.   MRN: [id number]  HPI  1.  Essential Hypertension/proteinuria:  Was on Lisinopril and moved to Losartan.  Tolerating well.  BP has been controlled.   1.  Decreased Visual Acuity:  Did get into eye evaluation.  Optometry on [address].  At [facility].  Does have voucher for a new Rx.  2.  Afib:  Following with  Roderic Palau, ANP-C at afib clinic.  Continuing with Rythmol SR 225 mg twice daily.  Had last episode of afib in October.  She feels her episodes are hormone driven as seem to come on when she is about to start her period.   Did not ask about cardiac rehab still. Has not been physically active at all. She has been leaf blowing and mowing over leaves. Does have a new job with [employer].  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   3.  OSA:  Needs new equipment with hose, bands and nose piece and new water tank.  She bought the nose piece on line in the recent past.  She cannot say who supplied her previously.    4.  Nosebleeds with Xarelto at 20 mg.  Unable to find how her med was bumped up to 20 mg.  Appears as 15 mg in her med list.  Was recently refilled for some reason through [facility] for the next year.  Discussed with her that she has a prescription at [facility] for a years worth of refills.  5.  Wonders if she is going through menopause.  Losing hair, vaginal dryness, pink show every couple of weeks since September. No hot flashes, but hot all the time, which is her baseline, but seems worse. Is having night sweats up around neck and that seems new in the past month.   Brings up fatigue again and discussed her need to work on stamina.  She called Dr. Olin Pia office to request referral to Cardiac Rehab today.  We have discussed for some time.  6.  Right pelvic pain:  Has cyst removal " the size of a softball" in past.  Unable to see  scanned in pelvic ultrasound in this chart from 2015.  Reportedly, recurrent ovarian cysts bilaterally, though right more frequent.    Current Meds  Medication Sig  . Ascorbic Acid (VITAMIN C WITH ROSE HIPS) 1000 MG tablet Take 2 mg by mouth daily.  . cetirizine (ZYRTEC) 10 MG tablet Take 1 tablet (10 mg total) by mouth daily.  Marland Kitchen escitalopram (LEXAPRO) 20 MG tablet Take 1 tablet (20 mg total) by mouth daily.  Marland Kitchen levothyroxine (SYNTHROID) 150 MCG tablet Take 1 tablet (150 mcg total) by mouth daily before breakfast.  . losartan (COZAAR) 25 MG tablet Take 1 tablet (25 mg total) by mouth daily.  . Magnesium 400 MG CAPS Take 1 tablet by mouth daily.  . metFORMIN (GLUCOPHAGE-XR) 500 MG 24 hr tablet Take 2 tablets (1,000 mg total) by mouth daily.  . propafenone (RYTHMOL SR) 225 MG 12 hr capsule Take 1 capsule (225 mg total) by mouth 2 (two) times daily.  . Rivaroxaban (XARELTO) 15 MG TABS tablet Take 1 tablet (15 mg total) by mouth daily with supper. (Patient taking differently: Take 20 mg by mouth daily with supper. )  . spironolactone (ALDACTONE) 50 MG tablet 1/2 tab by mouth daily in morning  Allergies  Allergen Reactions  . Multaq [Dronedarone] Swelling    Per patient  . Benadryl [Diphenhydramine Hcl]     itching  . Fish Oil     Makes hands raw, mouth and female area   . Influenza Vaccines     Gives pt the flu and site of injection swells and hot  . Latex     Burns skin  . Other Nausea Only    opiates  . Tape     Burns skin, band-aid, ekg stickers  . Sulfa Antibiotics Rash              Review of Systems     Objective:   Physical Exam  NAD Lungs:  CTA CV:  RRR withour murmur or rub.  Radial pulses normal and equal Abd:  S, NT, No HSM or mass.,tender in right lower quadrant mildy.  No rebound or peritoneal signs LE:  No edema        Assessment & Plan:  1.  Essential Hypertension:  Controlled  2.  Afib:  Seems controlled  3.  Morbid obesity/?Diabetes--only able  to find prediabetic A1C in chart/PCOS:  Not clear how motivated to gradually increase physical activity.  Has not really pursued options discussed. Interested in obtaining gastric sleeve should she be cleared by cardiology to do so and if coverage. A1C last checked at 5.8%  4.  OSA:  She is to check her equipment at home and let us know if she can tell where she obtained.  Will see if can refer for this through [facility], but have not been successful in past.  5.  Nosebleeds with Xarelto at 20 mg.  Discussed she has the 15 mg available.  Not clear how the increase occurred.  6.  Right pelvic pain:  Send for records from Gynecology, Dr Helane Rima to see what her history has been in past.  Consider pelvic ultrasound again after review of records  7.  Possible menopause:  Discussed likely perimenopausal

## 2016-09-04 ENCOUNTER — Ambulatory Visit
Admission: RE | Admit: 2016-09-04 | Discharge: 2016-09-04 | Disposition: A | Discharge status: Home / Self Care | Payer: No Typology Code available for payment source | Source: Ambulatory Visit | Attending: Internal Medicine | Admitting: Internal Medicine

## 2016-09-04 DIAGNOSIS — N838 Other noninflammatory disorders of ovary, fallopian tube and broad ligament: Secondary | ICD-10-CM | POA: Insufficient documentation

## 2016-09-04 DIAGNOSIS — R102 Pelvic and perineal pain: Secondary | ICD-10-CM | POA: Insufficient documentation

## 2016-09-10 ENCOUNTER — Telehealth: Payer: Self-pay | Admitting: Internal Medicine

## 2016-09-10 ENCOUNTER — Other Ambulatory Visit: Payer: Self-pay | Admitting: Internal Medicine

## 2016-09-10 DIAGNOSIS — N838 Other noninflammatory disorders of ovary, fallopian tube and broad ligament: Secondary | ICD-10-CM

## 2016-09-10 NOTE — Telephone Encounter (Signed)
Patient called and wants to know next steps in treatment for ovarian cyst.  [place] expires on the 18th of January.  Patient cannot re-apply until the 19th of January.  Please advise.

## 2016-09-11 ENCOUNTER — Telehealth: Payer: Self-pay | Admitting: Internal Medicine

## 2016-09-11 NOTE — Telephone Encounter (Signed)
Patient wants MRI scheduled if possible between 6pm or after.  Please schedule in [place] if possible.

## 2016-09-11 NOTE — Telephone Encounter (Signed)
Spoke with patient. Dr. Amil Amen ordered MRI of pelvis. Patient aware. Test scheduled for 09/20/16 @ 1:15 pm. Patient to arrive by 1 pm. Patient scheduled at [facility].

## 2016-09-11 NOTE — Progress Notes (Signed)
Spoke with patient. Appointment scheduled for 09/20/16 @ 1:15 pm. Patient to arrive by 1 pm. Scheduled at [facility].

## 2016-09-13 NOTE — Telephone Encounter (Signed)
Spoke with patient instructed to call scheduling and see if she can change her appointment to accommodate her schedule.

## 2016-09-15 ENCOUNTER — Encounter: Payer: Self-pay | Admitting: Internal Medicine

## 2016-09-17 ENCOUNTER — Ambulatory Visit (HOSPITAL_COMMUNITY)
Admission: RE | Admit: 2016-09-17 | Discharge: 2016-09-17 | Disposition: A | Discharge status: Home / Self Care | Payer: No Typology Code available for payment source | Source: Ambulatory Visit | Attending: Internal Medicine | Admitting: Internal Medicine

## 2016-09-17 DIAGNOSIS — N839 Noninflammatory disorder of ovary, fallopian tube and broad ligament, unspecified: Secondary | ICD-10-CM | POA: Insufficient documentation

## 2016-09-17 DIAGNOSIS — N838 Other noninflammatory disorders of ovary, fallopian tube and broad ligament: Secondary | ICD-10-CM

## 2016-09-17 DIAGNOSIS — N83201 Unspecified ovarian cyst, right side: Secondary | ICD-10-CM | POA: Insufficient documentation

## 2016-09-17 LAB — POCT I-STAT CREATININE: Creatinine, Ser: 0.8 mg/dL (ref 0.44–1.00)

## 2016-09-17 MED ORDER — GADOBENATE DIMEGLUMINE 529 MG/ML IV SOLN
20.0000 mL | Freq: Once | INTRAVENOUS | Status: AC | PRN
  Administered 2016-09-17: 20 mL via INTRAVENOUS

## 2016-09-20 ENCOUNTER — Ambulatory Visit: Payer: No Typology Code available for payment source

## 2016-09-29 ENCOUNTER — Encounter: Payer: Self-pay | Admitting: Internal Medicine

## 2016-10-29 NOTE — Addendum Note (Signed)
Addended by: Metta Clines on: 10/29/2016 02:48 PM   Modules accepted: Level of Service

## 2016-10-29 NOTE — Addendum Note (Signed)
Addended by: Metta Clines on: 10/29/2016 02:29 PM   Modules accepted: Level of Service

## 2016-10-29 NOTE — Addendum Note (Signed)
Addended by: Metta Clines on: 10/29/2016 02:40 PM   Modules accepted: Level of Service

## 2016-10-29 NOTE — Addendum Note (Signed)
Addended by: Metta Clines on: 10/29/2016 02:30 PM   Modules accepted: Level of Service

## 2016-10-29 NOTE — Addendum Note (Signed)
Addended by: Metta Clines on: 10/29/2016 02:42 PM   Modules accepted: Level of Service

## 2016-10-29 NOTE — Addendum Note (Signed)
Addended by: Metta Clines on: 10/29/2016 02:43 PM   Modules accepted: Level of Service

## 2016-10-30 NOTE — Addendum Note (Signed)
Addended by: Metta Clines on: 10/30/2016 03:46 PM   Modules accepted: Level of Service

## 2016-10-30 NOTE — Addendum Note (Signed)
Addended by: Metta Clines on: 10/30/2016 03:45 PM   Modules accepted: Level of Service

## 2016-12-27 ENCOUNTER — Other Ambulatory Visit (INDEPENDENT_AMBULATORY_CARE_PROVIDER_SITE_OTHER): Payer: Self-pay

## 2016-12-27 DIAGNOSIS — R300 Dysuria: Secondary | ICD-10-CM

## 2016-12-27 LAB — POCT URINALYSIS DIPSTICK
Bilirubin, UA: NEGATIVE
Blood, UA: NEGATIVE
Glucose, UA: NEGATIVE
Ketones, UA: NEGATIVE
LEUKOCYTES UA: NEGATIVE
Nitrite, UA: NEGATIVE
PH UA: 5 (ref 5.0–8.0)
PROTEIN UA: 15
SPEC GRAV UA: 1.02 (ref 1.010–1.025)
UROBILINOGEN UA: 0.2 U/dL

## 2016-12-29 LAB — URINE CULTURE

## 2017-02-12 ENCOUNTER — Encounter: Payer: Self-pay | Admitting: Internal Medicine

## 2017-02-12 ENCOUNTER — Ambulatory Visit (INDEPENDENT_AMBULATORY_CARE_PROVIDER_SITE_OTHER): Payer: BLUE CROSS/BLUE SHIELD | Admitting: Internal Medicine

## 2017-02-12 VITALS — BP 110/76 | HR 57 | Ht 65.0 in | Wt 290.0 lb

## 2017-02-12 DIAGNOSIS — I10 Essential (primary) hypertension: Secondary | ICD-10-CM

## 2017-02-12 DIAGNOSIS — I480 Paroxysmal atrial fibrillation: Secondary | ICD-10-CM

## 2017-02-12 NOTE — Progress Notes (Signed)
Patient Care Team: Dian Queen, MD as PCP - General (Obstetrics and Gynecology) Altheimer, Legrand Como, MD as Attending Physician (Endocrinology) Thompson Grayer, MD as Attending Physician (Cardiology) Deboraha Sprang, MD as Consulting Physician (Cardiology)   HPI  Desiree Schultz is a 46 y.o. female Seen in followup for atrial fibrillation occurring in the context of polycystic ovarian syndrome. She was started on apixaban. It was her impression that atrial fibrillation tracked with her period.  She has been treated most recently with propafenone twice a day. Previously failed flecainide.   She has had infrequent atrial fibrillation probably 2 spells in the last year. She thinks she is identified to triggers for both.  She is exercising and has lost 26 pounds or so. 12/17 313---290   she is exercising daily.  No bleeding issues   S  2015   CT scoring was 0.  2015 Echo LVEF normal with mild LVE/LAE  Without hypertrophy   Date Cr Hgb  1/18 0.8 none        9/17  LDL 124<<88 (53/29)    Thromboembolic risk profile is notable for gender, hypertension           Past Medical History:  Diagnosis Date  . Allergic rhinitis   . Anemia   . Atrial fibrillation ([place])    2006  . Bronchial breathing   . Chronic anxiety   . Depression   . Diabetes mellitus without complication ([place])   . Dysrhythmia   . Elevated blood pressure    On therapy. Probably hypertension  . Hx of cardiovascular stress test    a. Myoview 10/13:  no ischemia; EF 57%  . Hypothyroid   . Menorrhagia    2011  . Obesity (BMI 30-39.9)   . OSA (obstructive sleep apnea)    on CPAP  . PCOS (polycystic ovarian syndrome)   . PONV (postoperative nausea and vomiting)    C/o motion sickness  . Prediabetes   . Vitamin D deficiency     Past Surgical History:  Procedure Laterality Date  . BLADDER SURGERY  1997   interstitial cystitis   . BREAST REDUCTION SURGERY  06/2013  . BREAST SURGERY     reduction  . DILATION AND CURETTAGE, DIAGNOSTIC / THERAPEUTIC    . ENDOMETRIAL ABLATION    . ENDOMETRIAL ABLATION  08/2010  . HAND SURGERY  2006   due to cat bite  . KNEE ARTHROSCOPY WITH MEDIAL MENISECTOMY Left 04/06/2015   Procedure: LEFT KNEE ARTHROSCOPY WITH PARTIAL MEDIAL MENISCECTOMY;  Surgeon: Leandrew Koyanagi, MD;  Location: [facility];  Service: Orthopedics;  Laterality: Left;  . OVARIAN CYST REMOVAL  04/28/13  . RENAL BIOPSY, OPEN  2006  . TONSILLECTOMY  2007    Current Outpatient Prescriptions  Medication Sig Dispense Refill  . Ascorbic Acid (VITAMIN C WITH ROSE HIPS) 1000 MG tablet Take 2 mg by mouth daily.    . cetirizine (ZYRTEC) 10 MG tablet Take 1 tablet (10 mg total) by mouth daily. 30 tablet 11  . diltiazem (CARDIZEM) 60 MG tablet Take 60 mg by mouth once. For breakthrough of Atrial Fibrillation as needed    . escitalopram (LEXAPRO) 20 MG tablet Take 1 tablet (20 mg total) by mouth daily. 30 tablet 11  . levothyroxine (SYNTHROID) 150 MCG tablet Take 1 tablet (150 mcg total) by mouth daily before breakfast. 30 tablet 11  . metFORMIN (GLUCOPHAGE-XR) 500 MG 24 hr tablet Take 2 tablets (1,000 mg total) by mouth daily.  60 tablet 11  . mometasone (NASONEX) 50 MCG/ACT nasal spray 2 sprays each nostril daily 17 g 12  . propafenone (RYTHMOL SR) 225 MG 12 hr capsule Take 1 capsule (225 mg total) by mouth 2 (two) times daily. 20 capsule 0  . rivaroxaban (XARELTO) 20 MG TABS tablet Take 20 mg by mouth daily with supper.    Marland Kitchen spironolactone (ALDACTONE) 50 MG tablet 1/2 tab by mouth daily in morning 15 tablet 11   No current facility-administered medications for this visit.     Allergies  Allergen Reactions  . Multaq [Dronedarone] Swelling    Per patient  . Benadryl [Diphenhydramine Hcl]     itching  . Fish Oil     Makes hands raw, mouth and female area   . Influenza Vaccines     Gives pt the flu and site of injection swells and hot  . Latex     Burns skin  . Other Nausea Only     opiates  . Tape     Burns skin, band-aid, ekg stickers  . Sulfa Antibiotics Rash    Review of Systems negative except from HPI and PMH  Physical Exam There were no vitals taken for this visit. Well developed and well nourished in no acute distress HENT normal E scleral and icterus clear Neck Supple JVP flat; carotids brisk and full Clear to ausculation Regular rate and rhythm, no murmurs gallops or rub Soft with active bowel sounds No clubbing cyanosis  Edema Alert and oriented, grossly normal motor and sensory function Skin Warm and Dry  ECG  SR 57 17/08/41  Otherwise normal  Assessment and  Plan  Paroxysmal atrial fibrillation  Sinus bradycardia  Left ventricular enlargement  Hypothyroidism  Polycystic ovarian syndrome  Hypertension  Obstructive sleep apnea   On Anticoagulation;  No bleeding issues   Infrequent atrial fibrillation. We will keep her on  propafenone f 50 twice a day. She will take her metoprolol as needed.  Note that she has insurance she would like to consider dofetilide in her catheter ablation.   Lungs pressures improved and she's been able to down titrate her medications that she has lost weight.  It is very impressive. She has lost 10% of her body weight in the last 6 months  We will check potassium CBC thyroid status and renal status on her medications   We spent more than 50% of our >25 min visit in face to face counseling regarding the above

## 2017-02-12 NOTE — Patient Instructions (Addendum)
Medication Instructions: - Your physician recommends that you continue on your current medications as directed. Please refer to the Current Medication list given to you today.  Labwork: - Your physician recommends that you FASTING return for lab work : CMET/ CBC/ TSH/ Lipid ** enter in the [facility] entrance- 1st desk on the right to register for labs- you will then be directed straight down the hall way to the lab**   Procedures/Testing: - none ordered  Follow-Up: - Your physician wants you to follow-up in: 6 months [facility]. You will receive a reminder letter in the mail two months in advance. If you don't receive a letter, please call our office to schedule the follow-up appointment.   Any Additional Special Instructions Will Be Listed Below (If Applicable).     If you need a refill on your cardiac medications before your next appointment, please call your pharmacy.

## 2017-02-14 NOTE — Addendum Note (Signed)
Addended by: Bobby Rumpf C on: 02/14/2017 11:12 AM   Modules accepted: Orders

## 2017-03-14 ENCOUNTER — Ambulatory Visit: Payer: No Typology Code available for payment source | Admitting: Internal Medicine

## 2017-04-16 ENCOUNTER — Telehealth: Payer: Self-pay | Admitting: Internal Medicine

## 2017-04-16 MED ORDER — LEVOTHYROXINE SODIUM 150 MCG PO TABS: 150.0000 ug | ORAL_TABLET | Freq: Every day | ORAL | 2 refills | Status: DC

## 2017-04-16 NOTE — Telephone Encounter (Signed)
I called the pt and advised her that I have reached out to [facility] and am awaiting a call back from them at this time. She verbalized understanding and thanked me my help.

## 2017-04-16 NOTE — Telephone Encounter (Signed)
I spoke with the patient. She states she is no longer seeing Dr. Amil Amen as she could only see her with the [facility] card. She now has insurance.  She needs synthroid refilled at [facility] on [address]. I advised her this will need to be filled going forward with a PCP. Will send in refill today.  She is also needing patient assistance for propafenone and Xarelto.  She does have some prescription drug coverage, but co-pay for xarelto is $40/ month and propafenone is $80/ month.  I advised I would forward to pre-cert nurse to see if she can assist with this.   The patient voices understanding.

## 2017-04-16 NOTE — Telephone Encounter (Signed)
New message    Pt is calling stating she needs her synthroid switched to generic. She also asked if she can get her propafenone cheaper, she said she needs some assistance with this medication.  Please call pt.

## 2017-04-17 ENCOUNTER — Telehealth: Payer: Self-pay | Admitting: Internal Medicine

## 2017-04-17 ENCOUNTER — Other Ambulatory Visit: Payer: Self-pay | Admitting: *Deleted

## 2017-04-17 NOTE — Telephone Encounter (Signed)
I spoke with the patient who states that Dr. Caryl Comes agreed to fill her Glucophage-XR 500 MG 24 hr tablet because she just got insurance and does not have a primary care physician yet. I told her I would forward this to him to for permission to proceed with the refill.

## 2017-04-17 NOTE — Telephone Encounter (Signed)
New message   Patient calling   *STAT* If patient is at the pharmacy, call can be transferred to refill team.   1. Which medications need to be refilled? (please list name of each medication and dose if known) metFORMIN (GLUCOPHAGE-XR) 500 MG 24 hr tablet  2. Which pharmacy/location (including street and city if local pharmacy) is medication to be sent to? [facility] on [address]   3. Do they need a 30 day or 90 day supply? 30 days

## 2017-04-17 NOTE — Telephone Encounter (Signed)
I have not heard back from [facility] so I called the Provider Service # listed on the back of the pts [employer] insurance card. I s/w Catalina Lunger and requested to do a tier exception over the phone for Propafenone and Xarelto. Per Catalina Lunger they are faxing over forms to be filled out and faxed back to them. She states that it can take up to 24 hrs to receive the forms.  Awaiting forms to fill out and fax back to [facility].

## 2017-04-18 MED ORDER — METFORMIN HCL ER 500 MG PO TB24: 1000.0000 mg | ORAL_TABLET | Freq: Every day | ORAL | 1 refills | Status: DC

## 2017-04-18 NOTE — Telephone Encounter (Signed)
I spoke with the patient earlier this week- she is no longer on the [facility] card and therefore can't see Dr. Amil Amen- She was made aware she will need another PCP. RX for Glucophage sent in to [facility] on [address].  #60 w/ 1 refill- instructions to the pharmacy that any further refills will need to come from the patient's PCP.

## 2017-04-19 ENCOUNTER — Ambulatory Visit: Payer: BLUE CROSS/BLUE SHIELD | Admitting: Family Medicine

## 2017-04-22 NOTE — Telephone Encounter (Signed)
**Note De-Identified Heavin Sebree Obfuscation** [employer] did not fax me the tier exception forms needed to do a Tier exception on Xarelto and Propafenone. Because I was on hold for more than 45 mins waiting to request that form from [place] last Wednesday I have called the pts pharmacy, [facility], and asked them to generate a tier exception for these medications and fax it to me. Hopefully I will be able to do the tier exceptions once I receive.

## 2017-04-23 NOTE — Telephone Encounter (Signed)
**Note De-Identified Blaine Guiffre Obfuscation** Never received form from [place] either.  I called [employer] back today and was transferred from one department to another until I reached [place]. Per Antony Madura tier exceptions are not offered on these medications and a PA is not needed as they are both at the lowest cost possible.   Xarelto is $40/ month and Propafenone is $80/ month.  The pt states that she cannot afford theses medications.  Please advise.

## 2017-04-24 NOTE — Telephone Encounter (Signed)
Will need to review with Dr. Caryl Comes- he is out of the office until 05/03/17.

## 2017-05-01 NOTE — Telephone Encounter (Signed)
Spoke with the patient. I advised her of Lynn's comments below regarding Tier exception. I explained I had spoken with my manager and she recommended [place]- patient says she's tried this and its cheaper with her insurance. I advised her that "In[employer]" was also recommended, which is similar to [facility].  I explained to the patient that patient assistance from the company is usually not successful with patient who have RX drug coverage, but we could try this.   She is aware I will discuss further with Dr. Caryl Comes on Friday.  Per the patient, she has tried and failed flecainide, metoprolol, multaq, & plain profafenone. She is aware I will investigate further and call her back on Friday.  Form for xarelto patient assistance has been pulled. No patient assistance for insured patient's on profenone. Looking into the PAN foundation for propafenone.

## 2017-05-01 NOTE — Telephone Encounter (Signed)
I left a message for the patient to call. 

## 2017-05-01 NOTE — Telephone Encounter (Signed)
Follow up    Pt is calling to find out if discount was gotten for her two medications. Please call.

## 2017-05-02 ENCOUNTER — Encounter: Payer: Self-pay | Admitting: *Deleted

## 2017-05-02 NOTE — Telephone Encounter (Signed)
What an amazing effort efveryone I dont know what I can add

## 2017-05-07 ENCOUNTER — Encounter: Payer: Self-pay | Admitting: *Deleted

## 2017-05-07 NOTE — Telephone Encounter (Signed)
Xarelto patient assistance form, info for the PAN foundation for propafenone, Good RX card, and InCareRX card mailed to the patient.

## 2017-05-20 ENCOUNTER — Ambulatory Visit (INDEPENDENT_AMBULATORY_CARE_PROVIDER_SITE_OTHER): Payer: Self-pay | Admitting: Orthopaedic Surgery

## 2017-05-28 ENCOUNTER — Ambulatory Visit (INDEPENDENT_AMBULATORY_CARE_PROVIDER_SITE_OTHER): Payer: BLUE CROSS/BLUE SHIELD | Admitting: Orthopaedic Surgery

## 2017-05-28 ENCOUNTER — Encounter (INDEPENDENT_AMBULATORY_CARE_PROVIDER_SITE_OTHER): Payer: Self-pay | Admitting: Orthopaedic Surgery

## 2017-05-28 DIAGNOSIS — M25561 Pain in right knee: Secondary | ICD-10-CM

## 2017-05-28 DIAGNOSIS — G8929 Other chronic pain: Secondary | ICD-10-CM

## 2017-05-28 MED ORDER — METHYLPREDNISOLONE ACETATE 40 MG/ML IJ SUSP
40.0000 mg | INTRAMUSCULAR | Status: AC | PRN
  Administered 2017-05-28: 40 mg via INTRA_ARTICULAR

## 2017-05-28 MED ORDER — LIDOCAINE HCL 1 % IJ SOLN
2.0000 mL | INTRAMUSCULAR | Status: AC | PRN
  Administered 2017-05-28: 2 mL

## 2017-05-28 MED ORDER — BUPIVACAINE HCL 0.5 % IJ SOLN
2.0000 mL | INTRAMUSCULAR | Status: AC | PRN
  Administered 2017-05-28: 2 mL via INTRA_ARTICULAR

## 2017-05-28 NOTE — Progress Notes (Signed)
Office Visit Note   Patient: Desiree Schultz           Date of Birth: 05-01-1971           MRN: [id number] Visit Date: 05/28/2017              Requested by: Dian Queen, [address] [place], [place] [address] PCP: Dian Queen, MD   Assessment & Plan: Visit Diagnoses:  1. Chronic pain of right knee     Plan: Overall impression is degenerative lateral meniscal tear versus fat pad syndrome versus patellofemoral dysfunction. Cortisone injection was performed today. Questions encouraged and answered. Hopefully this will give her some relief. If not, we'll need to perform advanced imaging.  Follow-Up Instructions: Return if symptoms worsen or fail to improve.   Orders:  No orders of the defined types were placed in this encounter.  No orders of the defined types were placed in this encounter.     Procedures: Large Joint Inj Date/Time: 05/28/2017 10:27 AM Performed by: Leandrew Koyanagi Authorized by: Leandrew Koyanagi   Consent Given by:  Patient Timeout: prior to procedure the correct patient, procedure, and site was verified   Indications:  Pain Location:  Knee Site:  R knee Prep: patient was prepped and draped in usual sterile fashion   Needle Size:  22 G Approach:  Lateral Ultrasound Guidance: No   Fluoroscopic Guidance: No   Arthrogram: No   Medications:  2 mL bupivacaine 0.5 %; 40 mg methylPREDNISolone acetate 40 MG/ML; 2 mL lidocaine 1 % Patient tolerance:  Patient tolerated the procedure well with no immediate complications     Clinical Data: No additional findings.   Subjective: Chief Complaint  Patient presents with  . Right Knee - Pain    Patient 46 year old female who comes in today for right knee pain for 1 month. Her pain is mainly on the anterolateral aspect of the knee that is worse with getting up from a chair and going up and down stairs. She denies any injuries or swelling. Advil and Tylenol have failed to provide any  significant relief. Denies any numbness and tingling. She does endorse crepitus.    Review of Systems  Constitutional: Negative.   HENT: Negative.   Eyes: Negative.   Respiratory: Negative.   Cardiovascular: Negative.   Endocrine: Negative.   Musculoskeletal: Negative.   Neurological: Negative.   Hematological: Negative.   Psychiatric/Behavioral: Negative.   All other systems reviewed and are negative.    Objective: Vital Signs: There were no vitals taken for this visit.  Physical Exam  Constitutional: She is oriented to person, place, and time. She appears well-developed and well-nourished.  Pulmonary/Chest: Effort normal.  Neurological: She is alert and oriented to person, place, and time.  Skin: Skin is warm. Capillary refill takes less than 2 seconds.  Psychiatric: She has a normal mood and affect. Her behavior is normal. Judgment and thought content normal.  Nursing note and vitals reviewed.   Ortho Exam Right knee exam shows no joint effusion. Positive patellar crepitus. Collaterals and cruciates are stable. Mild lateral joint line tenderness. Specialty Comments:  No specialty comments available.  Imaging: No results found.   PMFS History: Patient Active Problem List   Diagnosis Date Noted  . Chronic pain of right knee 05/28/2017  . Controlled type 2 diabetes mellitus without complication, without long-term current use of insulin ([place]) 08/30/2016  . Menopause 08/30/2016  . Metabolic syndrome 72/53/6644  . Bilateral edema  of lower extremity 09/27/2015  . Proteinuria of undiagnosed cause 09/27/2015  . Buffalo hump 08/24/2015  . Prediabetes 02/10/2015  . Left knee pain 12/21/2014  . Depression 12/16/2014  . Hypothyroidism 10/05/2014  . Polycystic disease, ovaries 02/11/2014  . Obesity 11/14/2011  . OSA (obstructive sleep apnea) 01/09/2011  . Atrial fibrillation ([place]) 12/01/2010  . Healthcare maintenance 12/01/2010  . GAIT ABNORMALITY 01/16/2010   Past  Medical History:  Diagnosis Date  . Allergic rhinitis   . Anemia   . Atrial fibrillation ([place])    2006  . Bronchial breathing   . Chronic anxiety   . Depression   . Diabetes mellitus without complication ([place])   . Dysrhythmia   . Elevated blood pressure    On therapy. Probably hypertension  . Hx of cardiovascular stress test    a. Myoview 10/13:  no ischemia; EF 57%  . Hypothyroid   . Menorrhagia    2011  . Obesity (BMI 30-39.9)   . OSA (obstructive sleep apnea)    on CPAP  . PCOS (polycystic ovarian syndrome)   . PONV (postoperative nausea and vomiting)    C/o motion sickness  . Prediabetes   . Vitamin D deficiency     Family History  Problem Relation Age of Onset  . Cardiomyopathy Father   . Heart disease Father   . Hyperlipidemia Father   . Hypertension Father   . Cardiomyopathy Brother   . Hypertension Unknown        fathers side  . Hyperlipidemia Unknown        fathers side  . Heart failure Unknown        both grandparents - paternal  . Coronary artery disease Maternal Grandmother   . Heart attack Maternal Grandmother        and other maternal family members  . Heart attack Maternal Grandfather     Past Surgical History:  Procedure Laterality Date  . BLADDER SURGERY  1997   interstitial cystitis   . BREAST REDUCTION SURGERY  06/2013  . BREAST SURGERY     reduction  . DILATION AND CURETTAGE, DIAGNOSTIC / THERAPEUTIC    . ENDOMETRIAL ABLATION    . ENDOMETRIAL ABLATION  08/2010  . HAND SURGERY  2006   due to cat bite  . KNEE ARTHROSCOPY WITH MEDIAL MENISECTOMY Left 04/06/2015   Procedure: LEFT KNEE ARTHROSCOPY WITH PARTIAL MEDIAL MENISCECTOMY;  Surgeon: Leandrew Koyanagi, MD;  Location: [facility];  Service: Orthopedics;  Laterality: Left;  . OVARIAN CYST REMOVAL  04/28/13  . RENAL BIOPSY, OPEN  2006  . TONSILLECTOMY  2007   Social History   Occupational History  . [employer]   Social History Main Topics   . Smoking status: Former Smoker    Packs/day: 2.00    Years: 16.00    Types: Cigarettes    Quit date: 09/03/1998  . Smokeless tobacco: Never Used  . Alcohol use Yes     Comment: maybe 2 a mth  . Drug use: Yes    Types: Marijuana     Comment: occasional  . Sexual activity: Yes

## 2017-06-04 ENCOUNTER — Other Ambulatory Visit: Payer: Self-pay

## 2017-06-04 MED ORDER — SPIRONOLACTONE 50 MG PO TABS: ORAL_TABLET | ORAL | 11 refills | Status: DC

## 2017-06-14 ENCOUNTER — Ambulatory Visit: Payer: BLUE CROSS/BLUE SHIELD | Admitting: Family Medicine

## 2017-06-17 ENCOUNTER — Other Ambulatory Visit: Payer: Self-pay | Admitting: Internal Medicine

## 2017-06-17 ENCOUNTER — Other Ambulatory Visit: Payer: Self-pay | Admitting: *Deleted

## 2017-06-17 MED ORDER — RIVAROXABAN 20 MG PO TABS: 20.0000 mg | ORAL_TABLET | Freq: Every day | ORAL | 8 refills | Status: DC

## 2017-06-17 MED ORDER — SPIRONOLACTONE 50 MG PO TABS: ORAL_TABLET | ORAL | 2 refills | Status: DC

## 2017-06-17 MED ORDER — RYTHMOL SR 225 MG PO CP12: 225.0000 mg | ORAL_CAPSULE | Freq: Two times a day (BID) | ORAL | 2 refills | Status: DC

## 2017-06-17 NOTE — Telephone Encounter (Signed)
Xarelto 20mg  received; pt is 46 yrs old, Crea-0.80 on 09/17/16, Wt-131.5kg, last seen by Dr. Caryl Comes on 02/12/17, [place].79ml/min. Will send in refill request to requested Pharmacy.

## 2017-07-04 ENCOUNTER — Other Ambulatory Visit: Payer: Self-pay | Admitting: Obstetrics and Gynecology

## 2017-07-04 DIAGNOSIS — Z1231 Encounter for screening mammogram for malignant neoplasm of breast: Secondary | ICD-10-CM

## 2017-07-18 ENCOUNTER — Ambulatory Visit
Admission: RE | Admit: 2017-07-18 | Discharge: 2017-07-18 | Disposition: A | Discharge status: Home / Self Care | Payer: BLUE CROSS/BLUE SHIELD | Source: Ambulatory Visit | Attending: Obstetrics and Gynecology | Admitting: Obstetrics and Gynecology

## 2017-07-18 DIAGNOSIS — Z1231 Encounter for screening mammogram for malignant neoplasm of breast: Secondary | ICD-10-CM | POA: Diagnosis present

## 2017-07-24 ENCOUNTER — Other Ambulatory Visit: Payer: Self-pay | Admitting: Obstetrics and Gynecology

## 2017-07-24 DIAGNOSIS — N6489 Other specified disorders of breast: Secondary | ICD-10-CM

## 2017-07-24 DIAGNOSIS — R928 Other abnormal and inconclusive findings on diagnostic imaging of breast: Secondary | ICD-10-CM

## 2017-08-09 ENCOUNTER — Ambulatory Visit
Admission: RE | Admit: 2017-08-09 | Discharge: 2017-08-09 | Disposition: A | Discharge status: Home / Self Care | Payer: BLUE CROSS/BLUE SHIELD | Source: Ambulatory Visit | Attending: Obstetrics and Gynecology | Admitting: Obstetrics and Gynecology

## 2017-08-09 DIAGNOSIS — N6489 Other specified disorders of breast: Secondary | ICD-10-CM

## 2017-08-09 DIAGNOSIS — R928 Other abnormal and inconclusive findings on diagnostic imaging of breast: Secondary | ICD-10-CM

## 2017-08-09 DIAGNOSIS — N6001 Solitary cyst of right breast: Secondary | ICD-10-CM | POA: Diagnosis not present

## 2017-08-28 ENCOUNTER — Telehealth: Payer: Self-pay | Admitting: Internal Medicine

## 2017-08-28 NOTE — Telephone Encounter (Signed)
Patient calling, states that she needs a letter stating that the Propafenone ER was the only medication that is effective. Patient states that letter should also mention that she tried other medications but they were not effective. Patient asks that letter be sent to insurance asap, because they do not want to cover medication.

## 2017-08-29 NOTE — Telephone Encounter (Signed)
Prior authorization for RYTHMOL done through covermymeds/BCBS per patients call to office.

## 2017-09-02 NOTE — Telephone Encounter (Addendum)
**Note De-Identified Mikiah Demond Obfuscation** Form received from [facility] concerning the pts Rythmol PA. The form is requesting Dr Aquilla Hacker signature and to be faxed back to them at [phone] (there is no fax number listed on fax so I located this number online).  I have placed form in Dr Aquilla Hacker mail [place] and will fax back to [place] once signed.

## 2017-09-04 ENCOUNTER — Telehealth (INDEPENDENT_AMBULATORY_CARE_PROVIDER_SITE_OTHER): Payer: Self-pay | Admitting: Orthopaedic Surgery

## 2017-09-04 NOTE — Telephone Encounter (Signed)
Why urgent?

## 2017-09-04 NOTE — Telephone Encounter (Signed)
Not urgent.  R/o sturctural abnormalities

## 2017-09-04 NOTE — Telephone Encounter (Signed)
See message.

## 2017-09-04 NOTE — Telephone Encounter (Signed)
Dr Caryl Comes has signed form for Rythmol PA and I have faxed it back to [facility].

## 2017-09-04 NOTE — Telephone Encounter (Signed)
Called patient back to see why urgent and she states she is in so much pain.   Would you like for me to make order for MRI ? If so, would you like  R/o anything? Do you want order Urgent?

## 2017-09-04 NOTE — Telephone Encounter (Signed)
Patient called requesting to get an Urgent MRI order.  CB#[phone]

## 2017-09-05 ENCOUNTER — Other Ambulatory Visit (INDEPENDENT_AMBULATORY_CARE_PROVIDER_SITE_OTHER): Payer: Self-pay

## 2017-09-05 DIAGNOSIS — M25551 Pain in right hip: Secondary | ICD-10-CM

## 2017-09-05 NOTE — Telephone Encounter (Signed)
Order made for MRI. Someone will call her to make appt for MRI

## 2017-09-09 ENCOUNTER — Other Ambulatory Visit: Payer: Self-pay

## 2017-09-09 MED ORDER — PROPAFENONE HCL ER 225 MG PO CP12: 225.0000 mg | ORAL_CAPSULE | Freq: Two times a day (BID) | ORAL | 2 refills | Status: DC

## 2017-09-09 NOTE — Telephone Encounter (Signed)
**Note De-Identified Maanasa Aderhold Obfuscation** Fax received from [facility] stating that a PA is not required for Propafenone. Rythmol (DAW) is listed in the pts chart written by Dr Mack Hook.  I called the pt and she stated that to her knowledge she can take generic Propafenone as she has never had any problems taking it.  I called the pts pharmacy, [facility], and was advised that they cannot tell me the cost of either medication to the pt as it is too soon to re-order as the pt got her last refill on 08/22/17.  The pt is aware and states that she will contact us if her Propafenone is more expensive than she can afford.

## 2017-09-19 ENCOUNTER — Ambulatory Visit
Admission: RE | Admit: 2017-09-19 | Discharge: 2017-09-19 | Disposition: A | Discharge status: Home / Self Care | Payer: BLUE CROSS/BLUE SHIELD | Source: Ambulatory Visit | Attending: Orthopaedic Surgery | Admitting: Orthopaedic Surgery

## 2017-09-19 DIAGNOSIS — M25551 Pain in right hip: Secondary | ICD-10-CM

## 2017-09-20 ENCOUNTER — Other Ambulatory Visit (INDEPENDENT_AMBULATORY_CARE_PROVIDER_SITE_OTHER): Payer: Self-pay | Admitting: Orthopaedic Surgery

## 2017-09-20 ENCOUNTER — Ambulatory Visit: Payer: BLUE CROSS/BLUE SHIELD | Admitting: Pulmonary Disease

## 2017-09-20 ENCOUNTER — Encounter: Payer: Self-pay | Admitting: Pulmonary Disease

## 2017-09-20 VITALS — BP 120/82 | HR 58 | Ht 65.0 in | Wt 279.2 lb

## 2017-09-20 DIAGNOSIS — Z8659 Personal history of other mental and behavioral disorders: Secondary | ICD-10-CM | POA: Diagnosis not present

## 2017-09-20 DIAGNOSIS — Z6841 Body Mass Index (BMI) 40.0 and over, adult: Secondary | ICD-10-CM

## 2017-09-20 DIAGNOSIS — Z8679 Personal history of other diseases of the circulatory system: Secondary | ICD-10-CM | POA: Diagnosis not present

## 2017-09-20 DIAGNOSIS — Z9989 Dependence on other enabling machines and devices: Secondary | ICD-10-CM | POA: Diagnosis not present

## 2017-09-20 DIAGNOSIS — G4733 Obstructive sleep apnea (adult) (pediatric): Secondary | ICD-10-CM

## 2017-09-20 DIAGNOSIS — M25561 Pain in right knee: Principal | ICD-10-CM

## 2017-09-20 DIAGNOSIS — G8929 Other chronic pain: Secondary | ICD-10-CM

## 2017-09-20 NOTE — Patient Instructions (Signed)
Will arrange for new CPAP machine  Follow up in 2 to 3 months

## 2017-09-20 NOTE — Progress Notes (Signed)
[facility]  Chief Complaint  Patient presents with  . sleep consult    Pt not seen since 2015, using cpap machine needing new machine with new mask and supplies    Vital signs: BP 120/82 (BP Location: Left Arm, Cuff Size: Normal)   Pulse (!) 58   Ht 5\' 5"  (1.651 m)   Wt 279 lb 3.2 oz (126.6 kg)   SpO2 98%   BMI 46.46 kg/m   History of Present Illness: Desiree Schultz is a 47 y.o. female with obstructive sleep apnea.  I last saw her in 2015.  Her previous sleep study showed mild obstructive sleep apnea.  This was from 2007.  She has been using same CPAP since 2007.  She can't sleep w/o CPAP.  When her power went out and she couldn't use CPAP, she was miserable.  She snored, and would wake up feeling like she was choking.  She would toss and turn and feel miserable the next morning.  She has been buying supplies on her own for the past 3 years because she didn't have insurance.  She now has insurance.  Physical Exam:  General - pleasant Eyes - pupils reactive ENT - no sinus tenderness, no oral exudate, no LAN, MP 4, enlarged tongue Cardiac - regular, no murmur Chest - no wheeze, rales Abd - soft, non tender Ext - no edema Skin - no rashes Neuro - normal strength Psych - normal mood  Discussion: She has history of sleep apnea, and has been compliant with CPAP.  She has history of atrial fibrillation and depression.  Her CPAP is quite old and no longer working properly.    Assessment/Plan:  Obstructive sleep apnea. - will arrange for new auto CPAP machine - explained that she might need repeat sleep study prior to getting new CPAP >> if so, then can likely do home sleep study  Right knee pain. - she is followed by Dr. Erlinda Hong with orthopedics  History of atrial fibrillation. - she is followed by Dr. Caryl Comes   Patient Instructions  Will arrange for new CPAP machine  Follow up in 2 to 3 months    Chesley Mires, MD [place] 09/20/2017, 4:02 PM Pager:  [phone]  Flow Sheet   Sleep tests: PSG 05/03/06 >> AHI 9, REM 34, SpO2 low 84%. PSG 03/23/11 >> AHI 3, RDI 16, SpO2 low 90%, bruxism, REM AHI 5, no supine sleep.   Cardiac tests: Echo 02/22/14 >> EF 55 to 60%, mild AR, mild/mod MR  Past Medical History: She  has a past medical history of Allergic rhinitis, Anemia, Atrial fibrillation ([place]), Bronchial breathing, Chronic anxiety, Depression, Diabetes mellitus without complication ([place]), Dysrhythmia, Elevated blood pressure, cardiovascular stress test, Hypothyroid, Menorrhagia, Obesity (BMI 30-39.9), OSA (obstructive sleep apnea), PCOS (polycystic ovarian syndrome), PONV (postoperative nausea and vomiting), Prediabetes, and Vitamin D deficiency.  Past Surgical History: She  has a past surgical history that includes Endometrial ablation; Dilation and curettage, diagnostic / therapeutic; Tonsillectomy (2007); Bladder surgery (1997); Hand surgery (2006); Renal biopsy, open (2006); Endometrial ablation (08/2010); Ovarian cyst removal (04/28/13); Breast surgery; Knee arthroscopy with medial menisectomy (Left, 04/06/2015); Breast reduction surgery (06/2013); and Reduction mammaplasty (Bilateral, 2014).  Family History: Her family history includes Breast cancer in her maternal aunt; Cardiomyopathy in her brother and father; Coronary artery disease in her maternal grandmother; Heart attack in her maternal grandfather and maternal grandmother; Heart disease in her father; Heart failure in her unknown relative; Hyperlipidemia  in her father and unknown relative; Hypertension in her father and unknown relative.  Social History: She  reports that she quit smoking about 19 years ago. Her smoking use included cigarettes. She has a 32.00 pack-year smoking history. she has never used smokeless tobacco. She reports that she drinks alcohol. She reports that she uses drugs. Drug:  Marijuana.  Medications: Allergies as of 09/20/2017      Reactions   Multaq [dronedarone] Swelling   Per patient   Benadryl [diphenhydramine Hcl]    itching   Fish Oil    Makes hands raw, mouth and female area    Influenza Vaccines    Gives pt the flu and site of injection swells and hot   Latex    Burns skin   Other Nausea Only   opiates   Tape    Burns skin, band-aid, ekg stickers   Sulfa Antibiotics Rash      Medication List        Accurate as of 09/20/17  4:02 PM. Always use your most recent med list.          cetirizine 10 MG tablet Commonly known as:  ZYRTEC Take 1 tablet (10 mg total) by mouth daily.   escitalopram 20 MG tablet Commonly known as:  LEXAPRO Take 1 tablet (20 mg total) by mouth daily.   levothyroxine 150 MCG tablet Commonly known as:  SYNTHROID Take 1 tablet (150 mcg total) by mouth daily before breakfast.   metFORMIN 500 MG 24 hr tablet Commonly known as:  GLUCOPHAGE-XR Take 2 tablets (1,000 mg total) by mouth daily.   propafenone 225 MG 12 hr capsule Commonly known as:  RYTHMOL SR Take 1 capsule (225 mg total) by mouth 2 (two) times daily.   rivaroxaban 20 MG Tabs tablet Commonly known as:  XARELTO Take 1 tablet (20 mg total) by mouth daily with supper.   spironolactone 50 MG tablet Commonly known as:  ALDACTONE 1/2 tab by mouth daily in morning   vitamin C with rose hips 1000 MG tablet Take 2 mg by mouth daily.

## 2017-09-21 ENCOUNTER — Ambulatory Visit
Admission: RE | Admit: 2017-09-21 | Discharge: 2017-09-21 | Disposition: A | Discharge status: Home / Self Care | Payer: BLUE CROSS/BLUE SHIELD | Source: Ambulatory Visit | Attending: Orthopaedic Surgery | Admitting: Orthopaedic Surgery

## 2017-09-21 DIAGNOSIS — M25561 Pain in right knee: Principal | ICD-10-CM

## 2017-09-21 DIAGNOSIS — G8929 Other chronic pain: Secondary | ICD-10-CM

## 2017-09-23 ENCOUNTER — Other Ambulatory Visit (INDEPENDENT_AMBULATORY_CARE_PROVIDER_SITE_OTHER): Payer: Self-pay

## 2017-09-23 ENCOUNTER — Encounter (INDEPENDENT_AMBULATORY_CARE_PROVIDER_SITE_OTHER): Payer: Self-pay | Admitting: Orthopaedic Surgery

## 2017-09-23 ENCOUNTER — Ambulatory Visit (INDEPENDENT_AMBULATORY_CARE_PROVIDER_SITE_OTHER): Payer: BLUE CROSS/BLUE SHIELD | Admitting: Orthopaedic Surgery

## 2017-09-23 DIAGNOSIS — M1711 Unilateral primary osteoarthritis, right knee: Secondary | ICD-10-CM

## 2017-09-23 DIAGNOSIS — G8929 Other chronic pain: Secondary | ICD-10-CM

## 2017-09-23 DIAGNOSIS — M25561 Pain in right knee: Principal | ICD-10-CM

## 2017-09-23 MED ORDER — DICLOFENAC SODIUM 1 % TD GEL: 2.0000 g | Freq: Four times a day (QID) | TRANSDERMAL | 5 refills | Status: DC

## 2017-09-23 MED ORDER — TRAMADOL HCL 50 MG PO TABS: 50.0000 mg | ORAL_TABLET | Freq: Three times a day (TID) | ORAL | 2 refills | Status: DC | PRN

## 2017-09-23 NOTE — Progress Notes (Addendum)
Office Visit Note   Patient: Desiree Schultz           Date of Birth: Jan 08, 1971           MRN: [id number] Visit Date: 09/23/2017              Requested by: Dian Queen, [address], [place] [address] PCP: Dian Queen, MD   Assessment & Plan: Visit Diagnoses:  1. Unilateral primary osteoarthritis, right knee   2. Chronic pain of right knee     Plan: MRI findings are consistent with mild tricompartmental degenerative changes worse in the patellofemoral compartment with subchondral cysts.  These findings were discussed with the patient.  Uric acid level obtained today.  We had a long discussion regarding treatment options.  I think this is something that she is going to have to live with to a certain extent and will mainly need conservative treatment.  Preauthorization for Synvisc injection submitted today.  Patient is somewhat unrealistic with treatment expectations and progression of arthritis. Total face to face encounter time was greater than 25 minutes and over half of this time was spent in counseling and/or coordination of care.  Follow-Up Instructions: Return if symptoms worsen or fail to improve.   Orders:  Orders Placed This Encounter  Procedures  . Uric acid   Meds ordered this encounter  Medications  . diclofenac sodium (VOLTAREN) 1 % GEL    Sig: Apply 2 g topically 4 (four) times daily.    Dispense:  1 Tube    Refill:  5  . traMADol (ULTRAM) 50 MG tablet    Sig: Take 1-2 tablets (50-100 mg total) by mouth 3 (three) times daily as needed.    Dispense:  30 tablet    Refill:  2      Procedures: No procedures performed   Clinical Data: No additional findings.   Subjective: Chief Complaint  Patient presents with  . Right Knee - Pain, Follow-up    Patient follows up today to review her right knee MRI.  She states that her pain is still worse when she flexes her knee and when she stands on her knees.  She endorses  anterior knee pain.  Denies any numbness and tingling.      Review of Systems   Objective: Vital Signs: There were no vitals taken for this visit.  Physical Exam  Ortho Exam Right knee exam is stable Specialty Comments:  No specialty comments available.  Imaging: No results found.   PMFS History: Patient Active Problem List   Diagnosis Date Noted  . Chronic pain of right knee 05/28/2017  . Controlled type 2 diabetes mellitus without complication, without long-term current use of insulin ([place]) 08/30/2016  . Menopause 08/30/2016  . Metabolic syndrome 26/94/8546  . Bilateral edema of lower extremity 09/27/2015  . Proteinuria of undiagnosed cause 09/27/2015  . [place] hump 08/24/2015  . Prediabetes 02/10/2015  . Left knee pain 12/21/2014  . Depression 12/16/2014  . Hypothyroidism 10/05/2014  . Polycystic disease, ovaries 02/11/2014  . Obesity 11/14/2011  . OSA (obstructive sleep apnea) 01/09/2011  . Atrial fibrillation ([place]) 12/01/2010  . Healthcare maintenance 12/01/2010  . GAIT ABNORMALITY 01/16/2010   Past Medical History:  Diagnosis Date  . Allergic rhinitis   . Anemia   . Atrial fibrillation ([place])    2006  . Bronchial breathing   . Chronic anxiety   . Depression   . Diabetes mellitus without complication ([place])   .  Dysrhythmia   . Elevated blood pressure    On therapy. Probably hypertension  . Hx of cardiovascular stress test    a. Myoview 10/13:  no ischemia; EF 57%  . Hypothyroid   . Menorrhagia    2011  . Obesity (BMI 30-39.9)   . OSA (obstructive sleep apnea)    on CPAP  . PCOS (polycystic ovarian syndrome)   . PONV (postoperative nausea and vomiting)    C/o motion sickness  . Prediabetes   . Vitamin D deficiency     Family History  Problem Relation Age of Onset  . Cardiomyopathy Father   . Heart disease Father   . Hyperlipidemia Father   . Hypertension Father   . Cardiomyopathy Brother   . Hypertension Unknown        fathers side  .  Hyperlipidemia Unknown        fathers side  . Heart failure Unknown        both grandparents - paternal  . Coronary artery disease Maternal Grandmother   . Heart attack Maternal Grandmother        and other maternal family members  . Heart attack Maternal Grandfather   . Breast cancer Maternal Aunt        great    Past Surgical History:  Procedure Laterality Date  . BLADDER SURGERY  1997   interstitial cystitis   . BREAST REDUCTION SURGERY  06/2013  . BREAST SURGERY     reduction  . DILATION AND CURETTAGE, DIAGNOSTIC / THERAPEUTIC    . ENDOMETRIAL ABLATION    . ENDOMETRIAL ABLATION  08/2010  . HAND SURGERY  2006   due to cat bite  . KNEE ARTHROSCOPY WITH MEDIAL MENISECTOMY Left 04/06/2015   Procedure: LEFT KNEE ARTHROSCOPY WITH PARTIAL MEDIAL MENISCECTOMY;  Surgeon: Leandrew Koyanagi, MD;  Location: [facility];  Service: Orthopedics;  Laterality: Left;  . OVARIAN CYST REMOVAL  04/28/13  . REDUCTION MAMMAPLASTY Bilateral 2014  . RENAL BIOPSY, OPEN  2006  . TONSILLECTOMY  2007   Social History   Occupational History  . Occupation: Psychologist, educational: [employer]  Tobacco Use  . Smoking status: Former Smoker    Packs/day: 2.00    Years: 16.00    Pack years: 32.00    Types: Cigarettes    Last attempt to quit: 09/03/1998    Years since quitting: 19.0  . Smokeless tobacco: Never Used  Substance and Sexual Activity  . Alcohol use: Yes    Comment: maybe 2 a mth  . Drug use: Yes    Types: Marijuana    Comment: occasional  . Sexual activity: Yes

## 2017-09-24 ENCOUNTER — Telehealth (INDEPENDENT_AMBULATORY_CARE_PROVIDER_SITE_OTHER): Payer: Self-pay

## 2017-09-24 LAB — URIC ACID: Uric Acid, Serum: 5.7 mg/dL (ref 2.5–7.0)

## 2017-09-24 NOTE — Telephone Encounter (Signed)
PA for voltaren gel done through cover [email or web address] Pending Approval

## 2017-09-30 ENCOUNTER — Telehealth (INDEPENDENT_AMBULATORY_CARE_PROVIDER_SITE_OTHER): Payer: Self-pay

## 2017-09-30 NOTE — Telephone Encounter (Signed)
Merle Mattila (Key: [id number]) Voltaren 1% TD GEL Status: PA Response - Approved Created: January 22nd, 2019 Sent: January 22nd, 2019 Open  Archive   Rx approved. Called patient to advise.

## 2017-10-02 ENCOUNTER — Telehealth: Payer: Self-pay | Admitting: Internal Medicine

## 2017-10-02 NOTE — Telephone Encounter (Signed)
Pt c/o medication issue:  1. Name of Medication: rivaroxaban (XARELTO) 20 MG TABS tablet  2. How are you currently taking this medication (dosage and times per day)? Take 1 tablet (20 mg total) by mouth daily with supper.  3. Are you having a reaction (difficulty breathing--STAT)? no  4. What is your medication issue? Serious pain in both of her knees, pt took her today dose, pt want to know what else can she take

## 2017-10-02 NOTE — Telephone Encounter (Signed)
Spoke with pt and feels this is side effect from Xarelto  .Per pt has taken Eliquis in past and had kidney issues in combination with Multaq.  Pt is wanting to try differ med.Will forward to Dr Caryl Comes for recommendations .Adonis Housekeeper

## 2017-10-03 NOTE — Telephone Encounter (Signed)
I would stop Rivaroxaban for a few weeks and see if knee pains get better   And if so, try the med again to see if it is the drug

## 2017-10-04 ENCOUNTER — Telehealth: Payer: Self-pay | Admitting: Internal Medicine

## 2017-10-04 NOTE — Telephone Encounter (Signed)
LM for her to call back

## 2017-10-04 NOTE — Telephone Encounter (Signed)
Pt aware of recommendations and agrees ./cy 

## 2017-10-04 NOTE — Telephone Encounter (Signed)
New message  Patient request assistance getting medication cheaper using CoverMyMeds.   Pt c/o medication issue:  1. Name of Medication: propafenone (RYTHMOL SR) 225 MG 12 hr capsule 2. How are you currently taking this medication (dosage and times per day)? As prescribed 3. Are you having a reaction (difficulty breathing--STAT)? NO 4. What is your medication issue? Medication cost $80 (generic or brand name), Patient spoke with her insurance [employer]- [place] to see if  Cover My meds can be contacted to get cheaper price. Cover My Meds [phone] Fax# [phone]

## 2017-10-08 ENCOUNTER — Telehealth (INDEPENDENT_AMBULATORY_CARE_PROVIDER_SITE_OTHER): Payer: Self-pay

## 2017-10-08 NOTE — Telephone Encounter (Signed)
**Note De-Identified Payden Docter Obfuscation** I have done a Rythmol tier exception through covermymeds.

## 2017-10-08 NOTE — Telephone Encounter (Signed)
Called patient to let her know Synvisc Injection was approved. Needed a PA, that came back saying it was approved. Called patient she states how much that would be. I advised her to call her ins and ask them bc I cannot give her an exact amount.   SYNVISC APPROVED  BUY & BILL REFERENCE NUMBER: [id number] EFFECTIVE DATES 10/03/2017-10/03/2018   Sent approval form to scan.  Patient will call us back once she knows exactly how much she will have to pay.

## 2017-10-17 ENCOUNTER — Telehealth: Payer: Self-pay | Admitting: Internal Medicine

## 2017-10-17 NOTE — Telephone Encounter (Signed)
New message   Pt c/o medication issue:  1. Name of Medication: propafenone (RYTHMOL SR) 225 MG 12 hr capsule  2. How are you currently taking this medication (dosage and times per day)? Take 1 capsule (225 mg total) by mouth 2 (two) times daily.  3. Are you having a reaction (difficulty breathing--STAT)? NO  4. What is your medication issue? Denial from [facility]

## 2017-10-17 NOTE — Telephone Encounter (Signed)
I called BCBS regarding denial for tier exception previously submitted by Jeani Hawking Via, LPN, I discussed an appeal process for this, done as directed with documentation of allergy to Multaq and flecanide causes fatigue. Faxed to [phone] for physician review.

## 2017-11-18 ENCOUNTER — Telehealth: Payer: Self-pay | Admitting: Internal Medicine

## 2017-11-18 NOTE — Telephone Encounter (Signed)
**Note De-Identified Zurii Hewes Obfuscation** I called BC[employer] this am to discuss this tier exception. I s/w Mariann Laster who states that this tier exception has been denied X 2 by [employer] as [employer] is not allowed tiering exceptions.  I called the pt but she is requesting a call from Dr Aquilla Hacker nurse as she states that a PA or tier exception is not what she needs. She states that in order for her to get her Propafenone at a cost she can afford this medication needs a dose adjustment. I attempted to help her but she states that Dr Aquilla Hacker nurse needs to make the adjustment.  Will forward message to Lorren, Dr Aquilla Hacker nurse.

## 2017-11-18 NOTE — Telephone Encounter (Signed)
New Message    Pt c/o medication issue:  1. Name of Medication:  propafenone (RYTHMOL SR) 225 MG 12 hr capsule Take 1 capsule (225 mg total) by mouth 2 (two) times daily.      2. How are you currently taking this medication (dosage and times per day)?  2x a day  3. Are you having a reaction (difficulty breathing--STAT)? No   4. What is your medication issue? she can not afford the profafenone er, it is to expensive , you need to increase the regular so that it will work

## 2017-11-19 MED ORDER — PROPAFENONE HCL 225 MG PO TABS: 225.0000 mg | ORAL_TABLET | Freq: Three times a day (TID) | ORAL | 3 refills | Status: DC

## 2017-11-19 NOTE — Telephone Encounter (Signed)
Per Dr Caryl Comes, pt may change to Rythmol 225mg  tablet, 3 times daily. Per pt, the fast acting tablet is covered by her insurance.

## 2017-11-27 ENCOUNTER — Telehealth: Payer: Self-pay | Admitting: Internal Medicine

## 2017-11-27 NOTE — Telephone Encounter (Signed)
Per patient, she has been taking Propafenone 225mg  tid and it has not been helping. Dr Caryl Comes advised she begin taking 450mg  bid, which is max dose, to see if it helps. She is to have a RN visit in [place] for a repeat EKG in one week. If this is not helpful, Dr Caryl Comes would like her to follow up with the Afib clinic.

## 2017-11-27 NOTE — Telephone Encounter (Signed)
Pt c/o medication issue:  1. Name of Medication: propafenone (RYTHMOL SR) 225 MG 12 hr capsule  2. How are you currently taking this medication (dosage and times per day)? Take 1 capsule (225 mg total) by mouth 2 (two) times daily.  3. Are you having a reaction (difficulty breathing--STAT)? NO  4. What is your medication issue?Medication dosage not working

## 2017-11-27 NOTE — Telephone Encounter (Signed)
Patient calling,   Pt c/o medication issue:  1. Name of Medication: propafenone (RYTHMOL SR) 225 MG 12 hr capsule  2. How are you currently taking this medication (dosage and times per day)? Take 1 capsule (225 mg total) by mouth 2 (two) times daily.  3. Are you having a reaction (difficulty breathing--STAT)? NO  4. What is your medication issue? Denial from [place]. Patient would like office appeal BCBS decision.

## 2017-11-28 ENCOUNTER — Encounter: Payer: Self-pay | Admitting: *Deleted

## 2017-11-28 MED ORDER — PROPAFENONE HCL 225 MG PO TABS: 450.0000 mg | ORAL_TABLET | Freq: Two times a day (BID) | ORAL | 3 refills | Status: DC

## 2017-11-28 NOTE — Addendum Note (Signed)
Addended by: Dollene Primrose on: 11/28/2017 09:15 AM   Modules accepted: Orders

## 2017-11-28 NOTE — Telephone Encounter (Signed)
This encounter was created in error - please disregard.

## 2017-11-28 NOTE — Telephone Encounter (Signed)
I called regarding an appeal that was sent to [facility] for patients PROPAFENONE. I talked at length with them regarding patients cost on med. BCBS states a tier exception cannot be done on BCBS commercial plans and this is the type of insurance she has. BCBS states her co-pay is $80.00 per month and it is not possible to lower this cost.   I called and informed patient of this information. We discussed patient assistance with medication by [place]. I have printed this and will mail to patient.

## 2017-11-29 ENCOUNTER — Telehealth: Payer: Self-pay | Admitting: Internal Medicine

## 2017-11-29 NOTE — Telephone Encounter (Signed)
Pt c/o medication issue:  1. Name of Medication: Eliquis  2. How are you currently taking this medication (dosage and times per day)? n/a  3. Are you having a reaction (difficulty breathing--STAT)? no  4. What is your medication issue? Patient calling and would like to know if Dr.Klein would like her to resume Eliquis medication

## 2017-11-29 NOTE — Telephone Encounter (Signed)
Pt reports she stopped Xarelto over a month ago d/t knee pain.  She reports Dr Caryl Comes is aware.  She is wanting to know if he would like her to try Eliquis as she states she will be willing to.  Advised I will forward this information and request to Dr Caryl Comes and his nurse.  Pt aware she will be contacted with the decision.

## 2017-12-02 ENCOUNTER — Telehealth: Payer: Self-pay | Admitting: Internal Medicine

## 2017-12-02 NOTE — Telephone Encounter (Signed)
Spoke with patient who is concerned about her extended release Rythmol (cost $80)... She would like something else prescribed which she can afford please.Marland Kitchen Please advise, thank you

## 2017-12-02 NOTE — Telephone Encounter (Signed)
New Message  Pt c/o medication issue:  1. Name of Medication: propafenone (RYTHMOL) 225 MG tablet  2. How are you currently taking this medication (dosage and times per day)? Take 2 tablets (450 mg total) by mouth 2 (two) times daily.  3. Are you having a reaction (difficulty breathing--STAT)? no  4. What is your medication issue? Pt states that the rapid release was not working so she stopping taking it and went back to the extended release. Please call

## 2017-12-04 ENCOUNTER — Telehealth: Payer: Self-pay

## 2017-12-04 NOTE — Telephone Encounter (Signed)
Mychart message sent.

## 2017-12-04 NOTE — Telephone Encounter (Signed)
Called patient regarding her medication and her anticoagulant. She said she was willing to try Eliquis again despite the cost. I told her, after speaking with Dr Caryl Comes last week, he would like for her to be seen by the Afib clinic to try and manage her antiarrythmic and her anticoagulation. She is in agreeable to this. Information for the clinic will be sent to her via [place].  Secondly, we discussed Afib ablation treatment. I suggested she follow up with the Afib clinic first and discuss this option then. She will need to be seen in our office as well to discuss possible ablation. She was also agreeable to this.   She had no additional questions and thanked me for the call.

## 2017-12-04 NOTE — Telephone Encounter (Signed)
See additional encounter note.

## 2017-12-06 ENCOUNTER — Ambulatory Visit: Payer: BLUE CROSS/BLUE SHIELD

## 2017-12-12 NOTE — Telephone Encounter (Signed)
Issues are complicated    The role of anticoagulation is less clear with new guideline 2019 but think that rhythm control is best option for her and ablation would be a great choice

## 2017-12-13 ENCOUNTER — Ambulatory Visit (HOSPITAL_BASED_OUTPATIENT_CLINIC_OR_DEPARTMENT_OTHER)
Admission: RE | Admit: 2017-12-13 | Discharge: 2017-12-13 | Disposition: A | Discharge status: Home / Self Care | Payer: BLUE CROSS/BLUE SHIELD | Source: Ambulatory Visit | Attending: Nurse Practitioner | Admitting: Nurse Practitioner

## 2017-12-13 ENCOUNTER — Encounter (HOSPITAL_COMMUNITY): Payer: Self-pay | Admitting: Nurse Practitioner

## 2017-12-13 ENCOUNTER — Ambulatory Visit (HOSPITAL_COMMUNITY)
Admission: RE | Admit: 2017-12-13 | Discharge: 2017-12-13 | Disposition: A | Discharge status: Home / Self Care | Payer: BLUE CROSS/BLUE SHIELD | Source: Ambulatory Visit | Attending: Nurse Practitioner | Admitting: Nurse Practitioner

## 2017-12-13 VITALS — BP 122/86 | HR 59 | Ht 65.0 in | Wt 275.0 lb

## 2017-12-13 DIAGNOSIS — Z79890 Hormone replacement therapy (postmenopausal): Secondary | ICD-10-CM | POA: Insufficient documentation

## 2017-12-13 DIAGNOSIS — Z888 Allergy status to other drugs, medicaments and biological substances status: Secondary | ICD-10-CM | POA: Insufficient documentation

## 2017-12-13 DIAGNOSIS — Z885 Allergy status to narcotic agent status: Secondary | ICD-10-CM | POA: Diagnosis not present

## 2017-12-13 DIAGNOSIS — Z87891 Personal history of nicotine dependence: Secondary | ICD-10-CM | POA: Insufficient documentation

## 2017-12-13 DIAGNOSIS — I480 Paroxysmal atrial fibrillation: Secondary | ICD-10-CM | POA: Insufficient documentation

## 2017-12-13 DIAGNOSIS — Z8249 Family history of ischemic heart disease and other diseases of the circulatory system: Secondary | ICD-10-CM | POA: Diagnosis not present

## 2017-12-13 DIAGNOSIS — E282 Polycystic ovarian syndrome: Secondary | ICD-10-CM | POA: Insufficient documentation

## 2017-12-13 DIAGNOSIS — Z6841 Body Mass Index (BMI) 40.0 and over, adult: Secondary | ICD-10-CM | POA: Diagnosis not present

## 2017-12-13 DIAGNOSIS — Z887 Allergy status to serum and vaccine status: Secondary | ICD-10-CM | POA: Diagnosis not present

## 2017-12-13 DIAGNOSIS — I4891 Unspecified atrial fibrillation: Secondary | ICD-10-CM | POA: Diagnosis present

## 2017-12-13 DIAGNOSIS — Z91040 Latex allergy status: Secondary | ICD-10-CM | POA: Diagnosis not present

## 2017-12-13 DIAGNOSIS — F419 Anxiety disorder, unspecified: Secondary | ICD-10-CM | POA: Diagnosis not present

## 2017-12-13 DIAGNOSIS — E559 Vitamin D deficiency, unspecified: Secondary | ICD-10-CM | POA: Insufficient documentation

## 2017-12-13 DIAGNOSIS — F329 Major depressive disorder, single episode, unspecified: Secondary | ICD-10-CM | POA: Diagnosis not present

## 2017-12-13 DIAGNOSIS — E039 Hypothyroidism, unspecified: Secondary | ICD-10-CM | POA: Insufficient documentation

## 2017-12-13 DIAGNOSIS — E119 Type 2 diabetes mellitus without complications: Secondary | ICD-10-CM | POA: Diagnosis not present

## 2017-12-13 DIAGNOSIS — Z79899 Other long term (current) drug therapy: Secondary | ICD-10-CM | POA: Diagnosis not present

## 2017-12-13 DIAGNOSIS — Z882 Allergy status to sulfonamides status: Secondary | ICD-10-CM | POA: Diagnosis not present

## 2017-12-13 DIAGNOSIS — Z7984 Long term (current) use of oral hypoglycemic drugs: Secondary | ICD-10-CM | POA: Diagnosis not present

## 2017-12-13 DIAGNOSIS — E669 Obesity, unspecified: Secondary | ICD-10-CM | POA: Diagnosis not present

## 2017-12-13 DIAGNOSIS — G4733 Obstructive sleep apnea (adult) (pediatric): Secondary | ICD-10-CM | POA: Diagnosis not present

## 2017-12-13 LAB — ECHOCARDIOGRAM COMPLETE
Height: 65 in
Weight: 4400 oz

## 2017-12-13 MED ORDER — APIXABAN 5 MG PO TABS: 5.0000 mg | ORAL_TABLET | Freq: Two times a day (BID) | ORAL | 0 refills | Status: DC

## 2017-12-13 MED ORDER — APIXABAN 5 MG PO TABS: 5.0000 mg | ORAL_TABLET | Freq: Two times a day (BID) | ORAL | 3 refills | Status: DC

## 2017-12-13 NOTE — Progress Notes (Signed)
Primary Care Physician: Dian Queen, MD Referring Physician: Dr. Karle Barr Desiree Schultz is a 47 y.o. female with a h/o afib  In the afib clinic to discuss ablation. She has h/o paroxysmal afib that has been well controlled on Rythmol but is concerned re the cost of the drug. She had been on xarelto at one time but has a chadsvasc score of  1 currently is not on anticoagulation.  She is ready for an ablation. She had spoken to Dr. Rayann Heman in the past but was not ready for the procedure. She recently had knee surgery 4 weeks ago and had to avoid the gym since, but plans to get back soon.. She has lost 30 + lbs, usually exercises on a very regular basis.  Today, she denies symptoms of palpitations, chest pain, shortness of breath, orthopnea, PND, lower extremity edema, dizziness, presyncope, syncope, or neurologic sequela. The patient is tolerating medications without difficulties and is otherwise without complaint today.   Past Medical History:  Diagnosis Date  . Allergic rhinitis   . Anemia   . Atrial fibrillation ([place])    2006  . Bronchial breathing   . Chronic anxiety   . Depression   . Diabetes mellitus without complication ([place])   . Dysrhythmia   . Elevated blood pressure    On therapy. Probably hypertension  . Hx of cardiovascular stress test    a. Myoview 10/13:  no ischemia; EF 57%  . Hypothyroid   . Menorrhagia    2011  . Obesity (BMI 30-39.9)   . OSA (obstructive sleep apnea)    on CPAP  . PCOS (polycystic ovarian syndrome)   . PONV (postoperative nausea and vomiting)    C/o motion sickness  . Prediabetes   . Vitamin D deficiency    Past Surgical History:  Procedure Laterality Date  . BLADDER SURGERY  1997   interstitial cystitis   . BREAST REDUCTION SURGERY  06/2013  . BREAST SURGERY     reduction  . DILATION AND CURETTAGE, DIAGNOSTIC / THERAPEUTIC    . ENDOMETRIAL ABLATION    . ENDOMETRIAL ABLATION  08/2010  . HAND SURGERY  2006   due to cat bite  .  KNEE ARTHROSCOPY WITH MEDIAL MENISECTOMY Left 04/06/2015   Procedure: LEFT KNEE ARTHROSCOPY WITH PARTIAL MEDIAL MENISCECTOMY;  Surgeon: Leandrew Koyanagi, MD;  Location: [place];  Service: Orthopedics;  Laterality: Left;  . OVARIAN CYST REMOVAL  04/28/13  . REDUCTION MAMMAPLASTY Bilateral 2014  . RENAL BIOPSY, OPEN  2006  . TONSILLECTOMY  2007    Current Outpatient Medications  Medication Sig Dispense Refill  . Ascorbic Acid (VITAMIN C WITH ROSE HIPS) 1000 MG tablet Take 2 mg by mouth daily.    . cetirizine (ZYRTEC) 10 MG tablet Take 1 tablet (10 mg total) by mouth daily. 30 tablet 11  . escitalopram (LEXAPRO) 20 MG tablet Take 1 tablet (20 mg total) by mouth daily. 30 tablet 11  . levothyroxine (SYNTHROID) 150 MCG tablet Take 1 tablet (150 mcg total) by mouth daily before breakfast. 30 tablet 2  . metFORMIN (GLUCOPHAGE-XR) 500 MG 24 hr tablet Take 2 tablets (1,000 mg total) by mouth daily. (Patient taking differently: Take 1,500 mg by mouth daily. ) 60 tablet 1  . propafenone (RYTHMOL SR) 225 MG 12 hr capsule Take 225 mg by mouth 2 (two) times daily.    Marland Kitchen spironolactone (ALDACTONE) 50 MG tablet 1/2 tab by mouth daily in morning 45 tablet 2  .  traMADol (ULTRAM) 50 MG tablet Take 1-2 tablets (50-100 mg total) by mouth 3 (three) times daily as needed. 30 tablet 2  . apixaban (ELIQUIS) 5 MG TABS tablet Take 1 tablet (5 mg total) by mouth 2 (two) times daily. 60 tablet 0   No current facility-administered medications for this encounter.     Allergies  Allergen Reactions  . Multaq [Dronedarone] Swelling    Per patient  . Benadryl [Diphenhydramine Hcl]     itching  . Fish Oil     Makes hands raw, mouth and female area   . Influenza Vaccines     Gives pt the flu and site of injection swells and hot  . Latex     Burns skin  . Other Nausea Only    opiates  . Tape     Burns skin, band-aid, ekg stickers  . Sulfa Antibiotics Rash    Social History   Socioeconomic History  . Marital status:  Significant Other    Spouse name: Christell Faith  . Number of children: Not on file  . Years of education: associates in IT  . Highest education level: Not on file  Occupational History  . Occupation: Psychologist, educational: [employer]  Social Needs  . Financial resource strain: Not on file  . Food insecurity:    Worry: Not on file    Inability: Not on file  . Transportation needs:    Medical: Not on file    Non-medical: Not on file  Tobacco Use  . Smoking status: Former Smoker    Packs/day: 2.00    Years: 16.00    Pack years: 32.00    Types: Cigarettes    Last attempt to quit: 09/03/1998    Years since quitting: 19.2  . Smokeless tobacco: Never Used  Substance and Sexual Activity  . Alcohol use: Yes    Comment: maybe 2 a mth  . Drug use: Yes    Types: Marijuana    Comment: occasional  . Sexual activity: Yes  Lifestyle  . Physical activity:    Days per week: Not on file    Minutes per session: Not on file  . Stress: Not on file  Relationships  . Social connections:    Talks on phone: Not on file    Gets together: Not on file    Attends religious service: Not on file    Active member of club or organization: Not on file    Attends meetings of clubs or organizations: Not on file    Relationship status: Not on file  . Intimate partner violence:    Fear of current or ex partner: Not on file    Emotionally abused: Not on file    Physically abused: Not on file    Forced sexual activity: Not on file  Other Topics Concern  . Not on file  Social History Narrative   Long term partner, Ortencia Kick from [place]   Has lived in [place] since age 13 yo.    Family History  Problem Relation Age of Onset  . Cardiomyopathy Father   . Heart disease Father   . Hyperlipidemia Father   . Hypertension Father   . Cardiomyopathy Brother   . Hypertension Unknown        fathers side  . Hyperlipidemia Unknown         fathers side  . Heart failure Unknown  both grandparents - paternal  . Coronary artery disease Maternal Grandmother   . Heart attack Maternal Grandmother        and other maternal family members  . Heart attack Maternal Grandfather   . Breast cancer Maternal Aunt        great    ROS- All systems are reviewed and negative except as per the HPI above  Physical Exam: Vitals:   12/13/17 0839  BP: 122/86  Pulse: (!) 59  Weight: 275 lb (124.7 kg)  Height: 5\' 5"  (1.651 m)   Wt Readings from Last 3 Encounters:  12/13/17 275 lb (124.7 kg)  09/20/17 279 lb 3.2 oz (126.6 kg)  02/12/17 290 lb (131.5 kg)    Labs: Lab Results  Component Value Date   NA 138 05/21/2016   K 4.4 05/21/2016   CL 99 05/21/2016   CO2 24 05/21/2016   GLUCOSE 82 05/21/2016   BUN 9 05/21/2016   CREATININE 0.80 09/17/2016   CALCIUM 9.1 05/21/2016   No results found for: INR Lab Results  Component Value Date   CHOL 198 05/21/2016   HDL 32 (L) 05/21/2016   LDLCALC 124 (H) 05/21/2016   TRIG 212 (H) 05/21/2016     GEN- The patient is well appearing, alert and oriented x 3 today.   Head- normocephalic, atraumatic Eyes-  Sclera clear, conjunctiva pink Ears- hearing intact Oropharynx- clear Neck- supple, no JVP Lymph- no cervical lymphadenopathy Lungs- Clear to ausculation bilaterally, normal work of breathing Heart- Regular rate and rhythm, no murmurs, rubs or gallops, PMI not laterally displaced GI- soft, NT, ND, + BS Extremities- no clubbing, cyanosis, or edema MS- no significant deformity or atrophy Skin- no rash or lesion Psych- euthymic mood, full affect Neuro- strength and sensation are intact  EKG-NSR at 59 ms, PR int 180 ms, qtc, 405 ms   Assessment and Plan: 1. Paroxysmal afib  Pt is wanting ablation and will send to to Dr. Rayann Heman to further discuss Will plan to start on eliquis 5 mg bid so she will br ready to go, wants ASAP, explained she will have to be on for 3 weeks prior  and 4 weeks after Chadsvasc score of 1(female), in the chart it mentions HTN, but pt says she does not have HTN, so may not have to be on anticoagulation long term  Update echo  Continue Rythmol for now   [place]. [place], [facility] [address] [place], [place] [address] [phone]

## 2017-12-13 NOTE — Progress Notes (Signed)
  Echocardiogram 2D Echocardiogram has been performed.  Jennette Dubin 12/13/2017, 10:59 AM

## 2017-12-13 NOTE — Patient Instructions (Signed)
Start Eliquis 5mg  twice a day  Scheduling will be in touch for appt with allred.

## 2017-12-18 ENCOUNTER — Telehealth: Payer: Self-pay

## 2017-12-18 NOTE — Telephone Encounter (Signed)
Forwarded to Lorren Swanson °

## 2017-12-19 ENCOUNTER — Other Ambulatory Visit (HOSPITAL_COMMUNITY): Payer: BLUE CROSS/BLUE SHIELD

## 2017-12-31 ENCOUNTER — Encounter: Payer: Self-pay | Admitting: Cardiology

## 2017-12-31 ENCOUNTER — Ambulatory Visit: Payer: BLUE CROSS/BLUE SHIELD | Admitting: Cardiology

## 2017-12-31 VITALS — BP 114/70 | HR 62 | Ht 65.0 in | Wt 282.2 lb

## 2017-12-31 DIAGNOSIS — Z01812 Encounter for preprocedural laboratory examination: Secondary | ICD-10-CM

## 2017-12-31 DIAGNOSIS — G4733 Obstructive sleep apnea (adult) (pediatric): Secondary | ICD-10-CM | POA: Diagnosis not present

## 2017-12-31 DIAGNOSIS — I480 Paroxysmal atrial fibrillation: Secondary | ICD-10-CM

## 2017-12-31 MED ORDER — METOPROLOL TARTRATE 50 MG PO TABS: ORAL_TABLET | ORAL | 0 refills | Status: DC

## 2017-12-31 NOTE — Patient Instructions (Addendum)
Medication Instructions:  Your physician recommends that you continue on your current medications as directed. Please refer to the Current Medication list given to you today.  Labwork: Your physician recommends that you stop by a [facility] in [place] between 5/3 -5/9 for pre procedure blood work (BMET & CBC)  Testing/Procedures: Your physician has requested that you have cardiac CT within 7 days PRIOR to your ablation. Cardiac computed tomography (CT) is a painless test that uses an x-ray machine to take clear, detailed pictures of your heart. For further information please visit [email or web address]. Please follow instruction below under special instructions.  *The office will call you to arrange this testing after we have pre-certified with your  Insurance*  Your physician has recommended that you have an ablation. Catheter ablation is a medical procedure used to treat some cardiac arrhythmias (irregular heartbeats). During catheter ablation, a long, thin, flexible tube is put into a blood vessel in your groin (upper thigh), or neck. This tube is called an ablation catheter. It is then guided to your heart through the blood vessel. Radio frequency waves destroy small areas of heart tissue where abnormal heartbeats may cause an arrhythmia to start. Please see the instruction sheet given to you today. Instructions for your ablation: 1. Please arrive at the [facility], Main Entrance "A", of [facility] at 5:30 a.m. on 01/17/2018. 2. Do not eat or drink after midnight the night prior to the procedure. 3. Do not miss any doses of ELIQUIS prior to the morning of the procedure.  4. Do not take any medications the morning of the procedure. 5. You will shaved for this procedure (if needed). Typically both groins, chest and back  are shaven. We ask that you shave these areas yourself at home 1-2 days prior  to the procedure. If you are uncomfortable/unable, then hospital staff will shave  these areas  the morning of your procedure. 6. Plan for an overnight stay in the hospital. 7. You will need someone to drive you home at discharge.   Follow-Up: Your physician recommends that you schedule a follow-up appointment in: 4 weeks, after your procedure on 01/17/2018, with Roderic Palau in the AFib clinic.  Your physician recommends that you schedule a follow-up appointment in: 3 months, after your procedure on 01/17/2018, with Dr. Curt Bears.   * If you need a refill on your cardiac medications before your next appointment, please call your pharmacy.   *Please note that any paperwork needing to be filled out by the provider will need to be addressed at the front desk prior to seeing the provider. Please note that any FMLA, disability or other documents regarding health condition is subject to a $25.00 charge that must be received prior to completion of paperwork in the form of a money order or check.  Thank you for choosing [facility]!!   Trinidad Curet, RN [phone]  Any Other Special Instructions Will Be Listed Below (If Applicable).   CT INSTRUCTIONS Please arrive at the [facility] main entrance of [facility] at _____ AM (30-45 minutes prior to test start time)  [facility] [place], [place] [address] [phone]  Proceed to the [facility] Radiology Department (First Floor).  Please follow these instructions carefully (unless otherwise directed):  On the Night Before the Test: . Drink plenty of water. . Do not consume any caffeinated/decaffeinated beverages or chocolate 12 hours prior to your test. . Do not take any antihistamines 12 hours prior to  your test. . If you take Metformin do not take 24 hours prior to test.  On the Day of the Test: . Drink plenty of water. Do not drink any water within one hour of the test. . Do not eat any food 4 hours prior to the test. . You may take your regular medications prior to the test. . IF  NOT ON A BETA BLOCKER - Take 50 mg of lopressor (metoprolol) one hour before the test. . HOLD Furosemide morning of the test.  After the Test: . Drink plenty of water. . After receiving IV contrast, you may experience a mild flushed feeling. This is normal. . On occasion, you may experience a mild rash up to 24 hours after the test. This is not dangerous. If this occurs, you can take Benadryl 25 mg and increase your fluid intake. . If you experience trouble breathing, this can be serious. If it is severe call 911 IMMEDIATELY. If it is mild, please call our office. . If you take any of these medications: Glipizide/Metformin, Avandament, Glucavance, please do not take 48 hours after completing test.

## 2017-12-31 NOTE — Progress Notes (Signed)
Electrophysiology Office Note   Date:  12/31/2017   ID:  Desiree Schultz, DOB Jul 01, 1971, MRN [id number]  PCP:  Dian Queen, MD  Cardiologist:  Gaynelle Cage Electrophysiologist: Barnetta Hammersmith Meredith Leeds, MD    No chief complaint on file.    History of Present Illness: Desiree Schultz is a 47 y.o. female who is being seen today for the evaluation of atrial fibrillaiton at the request of Desiree Schultz. Presenting today for electrophysiology evaluation.  She has a history of paroxysmal atrial fibrillation that is been controlled on Rythmol.  She is concerned about the cost of the medication.   she has been on Xarelto in the past but due to low stroke risk has been taken off.  She exercises on a regular basis tendons recently lost 30+ pounds.    Today, she denies symptoms of palpitations, chest pain, shortness of breath, orthopnea, PND, lower extremity edema, claudication, dizziness, presyncope, syncope, bleeding, or neurologic sequela. The patient is tolerating medications without difficulties.    Past Medical History:  Diagnosis Date  . Allergic rhinitis   . Anemia   . Atrial fibrillation ([place])    2006  . Bronchial breathing   . Chronic anxiety   . Depression   . Diabetes mellitus without complication ([place])   . Dysrhythmia   . Elevated blood pressure    On therapy. Probably hypertension  . Hx of cardiovascular stress test    a. Myoview 10/13:  no ischemia; EF 57%  . Hypothyroid   . Menorrhagia    2011  . Obesity (BMI 30-39.9)   . OSA (obstructive sleep apnea)    on CPAP  . PCOS (polycystic ovarian syndrome)   . PONV (postoperative nausea and vomiting)    C/o motion sickness  . Prediabetes   . Vitamin D deficiency    Past Surgical History:  Procedure Laterality Date  . BLADDER SURGERY  1997   interstitial cystitis   . BREAST REDUCTION SURGERY  06/2013  . BREAST SURGERY     reduction  . DILATION AND CURETTAGE, DIAGNOSTIC / THERAPEUTIC    . ENDOMETRIAL  ABLATION    . ENDOMETRIAL ABLATION  08/2010  . HAND SURGERY  2006   due to cat bite  . KNEE ARTHROSCOPY WITH MEDIAL MENISECTOMY Left 04/06/2015   Procedure: LEFT KNEE ARTHROSCOPY WITH PARTIAL MEDIAL MENISCECTOMY;  Surgeon: Leandrew Koyanagi, MD;  Location: [facility];  Service: Orthopedics;  Laterality: Left;  . OVARIAN CYST REMOVAL  04/28/13  . REDUCTION MAMMAPLASTY Bilateral 2014  . RENAL BIOPSY, OPEN  2006  . TONSILLECTOMY  2007     Current Outpatient Medications  Medication Sig Dispense Refill  . apixaban (ELIQUIS) 5 MG TABS tablet Take 1 tablet (5 mg total) by mouth 2 (two) times daily. 60 tablet 0  . Ascorbic Acid (VITAMIN C WITH ROSE HIPS) 1000 MG tablet Take 2 mg by mouth daily.    . cetirizine (ZYRTEC) 10 MG tablet Take 1 tablet (10 mg total) by mouth daily. 30 tablet 11  . escitalopram (LEXAPRO) 20 MG tablet Take 1 tablet (20 mg total) by mouth daily. 30 tablet 11  . levothyroxine (SYNTHROID) 150 MCG tablet Take 1 tablet (150 mcg total) by mouth daily before breakfast. 30 tablet 2  . metFORMIN (GLUCOPHAGE-XR) 500 MG 24 hr tablet Take 2 tablets (1,000 mg total) by mouth daily. 60 tablet 1  . mometasone (NASONEX) 50 MCG/ACT nasal spray Place 2 sprays into the nose daily as needed.    Marland Kitchen  propafenone (RYTHMOL SR) 225 MG 12 hr capsule Take 225 mg by mouth 2 (two) times daily.    Marland Kitchen spironolactone (ALDACTONE) 50 MG tablet 1/2 tab by mouth daily in morning 45 tablet 2  . metoprolol tartrate (LOPRESSOR) 50 MG tablet Take one tablet (50 mg total) one hour before cardiac CT testing 1 tablet 0  . traMADol (ULTRAM) 50 MG tablet Take 1-2 tablets (50-100 mg total) by mouth 3 (three) times daily as needed. (Patient not taking: Reported on 12/31/2017) 30 tablet 2   No current facility-administered medications for this visit.     Allergies:   Multaq [dronedarone]; Benadryl [diphenhydramine hcl]; Fish oil; Influenza vaccines; Latex; Other; Tape; and Sulfa antibiotics   Social History:  The patient  reports  that she quit smoking about 19 years ago. Her smoking use included cigarettes. She has a 32.00 pack-year smoking history. She has never used smokeless tobacco. She reports that she drinks alcohol. She reports that she has current or past drug history. Drug: Marijuana.   Family History:  The patient's family history includes Breast cancer in her maternal aunt; Cardiomyopathy in her brother and father; Coronary artery disease in her maternal grandmother; Heart attack in her maternal grandfather and maternal grandmother; Heart disease in her father; Heart failure in her unknown relative; Hyperlipidemia in her father and unknown relative; Hypertension in her father and unknown relative.    ROS:  Please see the history of present illness.   Otherwise, review of systems is positive for none.   All other systems are reviewed and negative.    PHYSICAL EXAM: VS:  BP 114/70   Pulse 62   Ht 5\' 5"  (1.651 m)   Wt 282 lb 3.2 oz (128 kg)   SpO2 95%   BMI 46.96 kg/m  , BMI Body mass index is 46.96 kg/m. GEN: Well nourished, well developed, in no acute distress  HEENT: normal  Neck: no JVD, carotid bruits, or masses Cardiac: RRR; no murmurs, rubs, or gallops,no edema  Respiratory:  clear to auscultation bilaterally, normal work of breathing GI: soft, nontender, nondistended, + BS MS: no deformity or atrophy  Skin: warm and dry Neuro:  Strength and sensation are intact Psych: euthymic mood, full affect  EKG:  EKG is not ordered today. Personal review of the ekg ordered 12/13/17 shows SR, rate 59  Recent Labs: No results found for requested labs within last 8760 hours.    Lipid Panel     Component Value Date/Time   CHOL 198 05/21/2016 1350   TRIG 212 (H) 05/21/2016 1350   HDL 32 (L) 05/21/2016 1350   CHOLHDL 5.6 (H) 09/27/2015 0933   VLDL 37 (H) 09/27/2015 0933   LDLCALC 124 (H) 05/21/2016 1350     Wt Readings from Last 3 Encounters:  12/31/17 282 lb 3.2 oz (128 kg)  12/13/17 275 lb  (124.7 kg)  09/20/17 279 lb 3.2 oz (126.6 kg)      Other studies Reviewed: Additional studies/ records that were reviewed today include: TTE 12/13/17  Review of the above records today demonstrates:  - Left ventricle: The cavity size was normal. Systolic function was   normal. The estimated ejection fraction was in the range of 50%   to 55%. Images were inadequate for LV wall motion assessment.   Left ventricular diastolic function parameters were normal. - Tricuspid valve: There was trivial regurgitation. - Recommendations: Suggest limited study with defininty contrast to   accurately assess EF and wall motion as endocardial segments are  not well visualized.  - Left atrium:  The atrium was normal in size.  ASSESSMENT AND PLAN:  1.  Paroxysmal atrial fibrillation: Currently on Eliquis.  Also on Rythmol.  She would prefer ablation at this point.  Is on Rythmol but would prefer not to be on medications at this time.   Discussed risks and benefits of ablation.  Risks include bleeding, tamponade, heart block, stroke, damage to surrounding organs.  She understands these risks and is agreed to the procedure.  This patients CHA2DS2-VASc Score and unadjusted Ischemic Stroke Rate (% per year) is equal to 0.6 % stroke rate/year from a score of 1  Above score calculated as 1 point each if present [CHF, HTN, DM, Vascular=MI/PAD/Aortic Plaque, Age if 65-74, or Female] Above score calculated as 2 points each if present [Age > 75, or Stroke/TIA/TE]  2.  OSA: CPAP compliance encouraged   Current medicines are reviewed at length with the patient today.   The patient does not have concerns regarding her medicines.  The following changes were made today:  none  Labs/ tests ordered today include:  Orders Placed This Encounter  Procedures  . CT CARDIAC MORPH/PULM VEIN W/CM&W/O CA SCORE  . CT CORONARY FRACTIONAL FLOW RESERVE DATA PREP  . CT CORONARY FRACTIONAL FLOW RESERVE FLUID ANALYSIS  . Basic  Metabolic Panel (BMET)  . CBC   Case discussed with primary cardiology  Disposition:   FU with Delano Scardino 3 months  Signed, Tomer Chalmers Meredith Leeds, MD  12/31/2017 9:37 AM     [facility] [address] [place] [address] [phone] (office) [phone] (fax)

## 2018-01-08 ENCOUNTER — Other Ambulatory Visit: Payer: Self-pay | Admitting: Cardiology

## 2018-01-08 ENCOUNTER — Telehealth: Payer: Self-pay | Admitting: Cardiology

## 2018-01-08 NOTE — Telephone Encounter (Signed)
Informed pt I had not received any fax.  She replied that she had not faxed yet. Pt going to fax over some paperwork about time out of work (this will not be official FMLA paperwork)  EP fax # given to patient.

## 2018-01-08 NOTE — Telephone Encounter (Signed)
New message  Pt verbalzied that she is calling for RN  She is sending a document on fax [phone]  Attention to [employer]

## 2018-01-09 LAB — CBC WITH DIFFERENTIAL/PLATELET
BASOS ABS: 0 10*3/uL (ref 0.0–0.2)
BASOS: 0 %
EOS (ABSOLUTE): 0.2 10*3/uL (ref 0.0–0.4)
EOS: 3 %
HEMATOCRIT: 39.7 % (ref 34.0–46.6)
HEMOGLOBIN: 13.2 g/dL (ref 11.1–15.9)
Immature Grans (Abs): 0 10*3/uL (ref 0.0–0.1)
Immature Granulocytes: 0 %
Lymphocytes Absolute: 2 10*3/uL (ref 0.7–3.1)
Lymphs: 31 %
MCH: 31.5 pg (ref 26.6–33.0)
MCHC: 33.2 g/dL (ref 31.5–35.7)
MCV: 95 fL (ref 79–97)
MONOCYTES: 8 %
Monocytes Absolute: 0.5 10*3/uL (ref 0.1–0.9)
NEUTROS ABS: 3.7 10*3/uL (ref 1.4–7.0)
Neutrophils: 58 %
Platelets: 297 10*3/uL (ref 150–379)
RBC: 4.19 x10E6/uL (ref 3.77–5.28)
RDW: 13.4 % (ref 12.3–15.4)
WBC: 6.5 10*3/uL (ref 3.4–10.8)

## 2018-01-09 LAB — BASIC METABOLIC PANEL
BUN / CREAT RATIO: 16 (ref 9–23)
BUN: 11 mg/dL (ref 6–24)
CO2: 23 mmol/L (ref 20–29)
CREATININE: 0.7 mg/dL (ref 0.57–1.00)
Calcium: 8.7 mg/dL (ref 8.7–10.2)
Chloride: 104 mmol/L (ref 96–106)
GFR calc Af Amer: 118 mL/min/{1.73_m2} (ref >59)
GFR, EST NON AFRICAN AMERICAN: 103 mL/min/{1.73_m2} (ref >59)
Glucose: 92 mg/dL (ref 65–99)
Potassium: 4.1 mmol/L (ref 3.5–5.2)
SODIUM: 140 mmol/L (ref 134–144)

## 2018-01-12 ENCOUNTER — Other Ambulatory Visit (HOSPITAL_COMMUNITY): Payer: Self-pay | Admitting: Nurse Practitioner

## 2018-01-14 ENCOUNTER — Ambulatory Visit (HOSPITAL_COMMUNITY)
Admission: RE | Admit: 2018-01-14 | Discharge: 2018-01-14 | Disposition: A | Discharge status: Home / Self Care | Payer: BLUE CROSS/BLUE SHIELD | Source: Ambulatory Visit | Attending: Cardiology | Admitting: Cardiology

## 2018-01-14 ENCOUNTER — Ambulatory Visit (HOSPITAL_COMMUNITY): Payer: BLUE CROSS/BLUE SHIELD

## 2018-01-14 ENCOUNTER — Encounter: Payer: Self-pay | Admitting: Cardiology

## 2018-01-14 DIAGNOSIS — I480 Paroxysmal atrial fibrillation: Secondary | ICD-10-CM | POA: Insufficient documentation

## 2018-01-14 DIAGNOSIS — I4891 Unspecified atrial fibrillation: Secondary | ICD-10-CM | POA: Diagnosis not present

## 2018-01-14 MED ORDER — IOPAMIDOL (ISOVUE-370) INJECTION 76%
100.0000 mL | Freq: Once | INTRAVENOUS | Status: AC | PRN
  Administered 2018-01-14: 80 mL via INTRAVENOUS

## 2018-01-14 MED ORDER — NITROGLYCERIN 0.4 MG SL SUBL
SUBLINGUAL_TABLET | SUBLINGUAL | Status: AC
  Administered 2018-01-14: 0.4 mg via SUBLINGUAL
  Filled 2018-01-14: qty 1

## 2018-01-14 MED ORDER — NITROGLYCERIN 0.4 MG SL SUBL
0.4000 mg | SUBLINGUAL_TABLET | SUBLINGUAL | Status: DC | PRN
  Administered 2018-01-14: 0.4 mg via SUBLINGUAL

## 2018-01-14 MED ORDER — IOPAMIDOL (ISOVUE-370) INJECTION 76%
INTRAVENOUS | Status: AC
  Filled 2018-01-14: qty 100

## 2018-01-14 NOTE — Telephone Encounter (Signed)
This encounter was created in error - please disregard.

## 2018-01-14 NOTE — Telephone Encounter (Signed)
Informed pt that Dr. Curt Bears will complete FMLA paperwork and leave will be for one week post procedure. Explained that if patient is having issues recovering from procedure end of next week to call me and he will extend her time off another week. Patient understands that I will fax paperwork Thursday. Patient verbalized understanding and agreeable to plan.

## 2018-01-14 NOTE — Telephone Encounter (Signed)
New message    Patient calling, requesting to speak with nurse regarding FMLA paperwork

## 2018-01-14 NOTE — Telephone Encounter (Signed)
[place], [place] H at 01/14/2018 3:12 PM   Status: Signed    New message  Patient calling, requesting to speak with nurse regarding FMLA paperwork

## 2018-01-16 NOTE — Telephone Encounter (Signed)
Completed FMLA paperwork faxed this morning.  Confirmation received. Left detailed message informing patient request has been completed.

## 2018-01-17 ENCOUNTER — Other Ambulatory Visit: Payer: Self-pay

## 2018-01-17 ENCOUNTER — Ambulatory Visit (HOSPITAL_COMMUNITY): Payer: BLUE CROSS/BLUE SHIELD | Admitting: Certified Registered"

## 2018-01-17 ENCOUNTER — Encounter (HOSPITAL_COMMUNITY): Payer: Self-pay

## 2018-01-17 ENCOUNTER — Ambulatory Visit (HOSPITAL_COMMUNITY)
Admission: RE | Admit: 2018-01-17 | Discharge: 2018-01-18 | Disposition: A | Discharge status: Home / Self Care | Payer: BLUE CROSS/BLUE SHIELD | Source: Ambulatory Visit | Attending: Cardiology | Admitting: Cardiology

## 2018-01-17 ENCOUNTER — Ambulatory Visit (HOSPITAL_COMMUNITY)
Admission: RE | Disposition: A | Discharge status: Home / Self Care | Payer: Self-pay | Source: Ambulatory Visit | Attending: Cardiology

## 2018-01-17 DIAGNOSIS — G4733 Obstructive sleep apnea (adult) (pediatric): Secondary | ICD-10-CM | POA: Diagnosis not present

## 2018-01-17 DIAGNOSIS — Z7901 Long term (current) use of anticoagulants: Secondary | ICD-10-CM | POA: Insufficient documentation

## 2018-01-17 DIAGNOSIS — Z6841 Body Mass Index (BMI) 40.0 and over, adult: Secondary | ICD-10-CM | POA: Insufficient documentation

## 2018-01-17 DIAGNOSIS — D649 Anemia, unspecified: Secondary | ICD-10-CM | POA: Diagnosis not present

## 2018-01-17 DIAGNOSIS — Z882 Allergy status to sulfonamides status: Secondary | ICD-10-CM | POA: Diagnosis not present

## 2018-01-17 DIAGNOSIS — E8881 Metabolic syndrome: Secondary | ICD-10-CM | POA: Insufficient documentation

## 2018-01-17 DIAGNOSIS — E119 Type 2 diabetes mellitus without complications: Secondary | ICD-10-CM | POA: Insufficient documentation

## 2018-01-17 DIAGNOSIS — Z7984 Long term (current) use of oral hypoglycemic drugs: Secondary | ICD-10-CM | POA: Insufficient documentation

## 2018-01-17 DIAGNOSIS — E282 Polycystic ovarian syndrome: Secondary | ICD-10-CM | POA: Diagnosis not present

## 2018-01-17 DIAGNOSIS — Z87891 Personal history of nicotine dependence: Secondary | ICD-10-CM | POA: Insufficient documentation

## 2018-01-17 DIAGNOSIS — F329 Major depressive disorder, single episode, unspecified: Secondary | ICD-10-CM | POA: Insufficient documentation

## 2018-01-17 DIAGNOSIS — I10 Essential (primary) hypertension: Secondary | ICD-10-CM | POA: Diagnosis not present

## 2018-01-17 DIAGNOSIS — Z885 Allergy status to narcotic agent status: Secondary | ICD-10-CM | POA: Diagnosis not present

## 2018-01-17 DIAGNOSIS — I480 Paroxysmal atrial fibrillation: Secondary | ICD-10-CM | POA: Diagnosis present

## 2018-01-17 DIAGNOSIS — E669 Obesity, unspecified: Secondary | ICD-10-CM | POA: Diagnosis not present

## 2018-01-17 DIAGNOSIS — R7303 Prediabetes: Secondary | ICD-10-CM | POA: Diagnosis present

## 2018-01-17 HISTORY — PX: ATRIAL FIBRILLATION ABLATION: EP1191

## 2018-01-17 LAB — POCT ACTIVATED CLOTTING TIME
Activated Clotting Time: 257 seconds
Activated Clotting Time: 290 seconds
Activated Clotting Time: 296 seconds
Activated Clotting Time: 164 seconds

## 2018-01-17 LAB — GLUCOSE, CAPILLARY
GLUCOSE-CAPILLARY: 100 mg/dL — AB (ref 65–99)
GLUCOSE-CAPILLARY: 111 mg/dL — AB (ref 65–99)
Glucose-Capillary: 143 mg/dL — ABNORMAL HIGH (ref 65–99)
Glucose-Capillary: 190 mg/dL — ABNORMAL HIGH (ref 65–99)

## 2018-01-17 LAB — HEMOGLOBIN A1C
Hgb A1c MFr Bld: 5.4 % (ref 4.8–5.6)
Mean Plasma Glucose: 108.28 mg/dL

## 2018-01-17 LAB — PREGNANCY, URINE: PREG TEST UR: NEGATIVE

## 2018-01-17 SURGERY — ATRIAL FIBRILLATION ABLATION
Anesthesia: General

## 2018-01-17 MED ORDER — DEXAMETHASONE SODIUM PHOSPHATE 4 MG/ML IJ SOLN
INTRAMUSCULAR | Status: DC | PRN
  Administered 2018-01-17: 8 mg via INTRAVENOUS

## 2018-01-17 MED ORDER — ACETAMINOPHEN 325 MG PO TABS: 650.0000 mg | ORAL_TABLET | ORAL | Status: DC | PRN

## 2018-01-17 MED ORDER — BUPIVACAINE HCL (PF) 0.25 % IJ SOLN
INTRAMUSCULAR | Status: DC | PRN
  Administered 2018-01-17: 30 mL

## 2018-01-17 MED ORDER — LACTATED RINGERS IV SOLN
INTRAVENOUS | Status: DC | PRN
Start: 2018-01-17 — End: 2018-01-17
  Administered 2018-01-17 (×2): via INTRAVENOUS

## 2018-01-17 MED ORDER — BUPIVACAINE HCL (PF) 0.25 % IJ SOLN
INTRAMUSCULAR | Status: AC
  Filled 2018-01-17: qty 30

## 2018-01-17 MED ORDER — HEPARIN SODIUM (PORCINE) 1000 UNIT/ML IJ SOLN
INTRAMUSCULAR | Status: DC | PRN
  Administered 2018-01-17: 1000 [IU] via INTRAVENOUS

## 2018-01-17 MED ORDER — ONDANSETRON HCL 4 MG/2ML IJ SOLN
INTRAMUSCULAR | Status: DC | PRN
  Administered 2018-01-17: 4 mg via INTRAVENOUS

## 2018-01-17 MED ORDER — PHENYLEPHRINE 40 MCG/ML (10ML) SYRINGE FOR IV PUSH (FOR BLOOD PRESSURE SUPPORT)
PREFILLED_SYRINGE | INTRAVENOUS | Status: DC | PRN
  Administered 2018-01-17: 80 ug via INTRAVENOUS
  Administered 2018-01-17: 40 ug via INTRAVENOUS

## 2018-01-17 MED ORDER — HEPARIN (PORCINE) IN NACL 1000-0.9 UT/500ML-% IV SOLN
INTRAVENOUS | Status: AC
  Filled 2018-01-17: qty 500

## 2018-01-17 MED ORDER — SPIRONOLACTONE 25 MG PO TABS
25.0000 mg | ORAL_TABLET | Freq: Every day | ORAL | Status: DC
  Administered 2018-01-17 – 2018-01-18 (×2): 25 mg via ORAL
  Filled 2018-01-17 (×2): qty 1

## 2018-01-17 MED ORDER — SUCCINYLCHOLINE CHLORIDE 20 MG/ML IJ SOLN
INTRAMUSCULAR | Status: DC | PRN
  Administered 2018-01-17: 120 mg via INTRAVENOUS

## 2018-01-17 MED ORDER — MIDAZOLAM HCL 5 MG/5ML IJ SOLN
INTRAMUSCULAR | Status: DC | PRN
  Administered 2018-01-17: 2 mg via INTRAVENOUS

## 2018-01-17 MED ORDER — SODIUM CHLORIDE 0.9 % IV SOLN: 250.0000 mL | INTRAVENOUS | Status: DC | PRN

## 2018-01-17 MED ORDER — ESCITALOPRAM OXALATE 10 MG PO TABS
20.0000 mg | ORAL_TABLET | Freq: Every day | ORAL | Status: DC
  Administered 2018-01-17 – 2018-01-18 (×2): 20 mg via ORAL
  Filled 2018-01-17 (×2): qty 2

## 2018-01-17 MED ORDER — SCOPOLAMINE 1 MG/3DAYS TD PT72
MEDICATED_PATCH | TRANSDERMAL | Status: DC | PRN
  Administered 2018-01-17: 1 via TRANSDERMAL

## 2018-01-17 MED ORDER — SODIUM CHLORIDE 0.9% FLUSH
3.0000 mL | Freq: Two times a day (BID) | INTRAVENOUS | Status: DC
  Administered 2018-01-17: 3 mL via INTRAVENOUS

## 2018-01-17 MED ORDER — SODIUM CHLORIDE 0.9 % IV SOLN
INTRAVENOUS | Status: DC
  Administered 2018-01-17: 06:00:00 via INTRAVENOUS

## 2018-01-17 MED ORDER — LORATADINE 10 MG PO TABS
10.0000 mg | ORAL_TABLET | Freq: Every day | ORAL | Status: DC
  Administered 2018-01-17 – 2018-01-18 (×2): 10 mg via ORAL
  Filled 2018-01-17 (×2): qty 1

## 2018-01-17 MED ORDER — SODIUM CHLORIDE 0.9 % IV SOLN: INTRAVENOUS | Status: DC

## 2018-01-17 MED ORDER — DOBUTAMINE IN D5W 4-5 MG/ML-% IV SOLN
INTRAVENOUS | Status: DC | PRN
  Administered 2018-01-17: 20 ug/kg/min via INTRAVENOUS

## 2018-01-17 MED ORDER — SUGAMMADEX SODIUM 200 MG/2ML IV SOLN
INTRAVENOUS | Status: DC | PRN
  Administered 2018-01-17: 250 mg via INTRAVENOUS

## 2018-01-17 MED ORDER — VITAMIN D 1000 UNITS PO TABS
5000.0000 [IU] | ORAL_TABLET | Freq: Every day | ORAL | Status: DC
  Administered 2018-01-18: 5000 [IU] via ORAL
  Filled 2018-01-17 (×3): qty 5

## 2018-01-17 MED ORDER — APIXABAN 5 MG PO TABS
5.0000 mg | ORAL_TABLET | Freq: Two times a day (BID) | ORAL | Status: DC
  Administered 2018-01-17 – 2018-01-18 (×2): 5 mg via ORAL
  Filled 2018-01-17 (×2): qty 1

## 2018-01-17 MED ORDER — PROTAMINE SULFATE 10 MG/ML IV SOLN
INTRAVENOUS | Status: DC | PRN
  Administered 2018-01-17: 5 mg via INTRAVENOUS
  Administered 2018-01-17: 15 mg via INTRAVENOUS
  Administered 2018-01-17 (×2): 10 mg via INTRAVENOUS

## 2018-01-17 MED ORDER — METOPROLOL TARTRATE 50 MG PO TABS
50.0000 mg | ORAL_TABLET | Freq: Two times a day (BID) | ORAL | Status: DC
  Filled 2018-01-17: qty 1

## 2018-01-17 MED ORDER — PROPOFOL 10 MG/ML IV BOLUS
INTRAVENOUS | Status: DC | PRN
Start: 2018-01-17 — End: 2018-01-17
  Administered 2018-01-17: 180 mg via INTRAVENOUS
  Administered 2018-01-17: 70 mg via INTRAVENOUS

## 2018-01-17 MED ORDER — HEPARIN (PORCINE) IN NACL 2-0.9 UNITS/ML
INTRAMUSCULAR | Status: AC | PRN
  Administered 2018-01-17 (×5): 500 mL

## 2018-01-17 MED ORDER — LEVOTHYROXINE SODIUM 75 MCG PO TABS
150.0000 ug | ORAL_TABLET | Freq: Every day | ORAL | Status: DC
  Administered 2018-01-18: 150 ug via ORAL
  Filled 2018-01-17: qty 2

## 2018-01-17 MED ORDER — LIDOCAINE 2% (20 MG/ML) 5 ML SYRINGE
INTRAMUSCULAR | Status: DC | PRN
  Administered 2018-01-17: 70 mg via INTRAVENOUS

## 2018-01-17 MED ORDER — ONDANSETRON HCL 4 MG/2ML IJ SOLN
4.0000 mg | Freq: Four times a day (QID) | INTRAMUSCULAR | Status: DC | PRN
Start: 2018-01-17 — End: 2018-01-18
  Filled 2018-01-17: qty 2

## 2018-01-17 MED ORDER — INSULIN ASPART 100 UNIT/ML ~~LOC~~ SOLN
0.0000 [IU] | Freq: Three times a day (TID) | SUBCUTANEOUS | Status: DC
  Administered 2018-01-17: 3 [IU] via SUBCUTANEOUS

## 2018-01-17 MED ORDER — METFORMIN HCL ER 500 MG PO TB24
500.0000 mg | ORAL_TABLET | Freq: Three times a day (TID) | ORAL | Status: DC
  Administered 2018-01-17 – 2018-01-18 (×2): 500 mg via ORAL
  Filled 2018-01-17 (×2): qty 1

## 2018-01-17 MED ORDER — SODIUM CHLORIDE 0.9% FLUSH: 3.0000 mL | INTRAVENOUS | Status: DC | PRN

## 2018-01-17 MED ORDER — PROPAFENONE HCL ER 225 MG PO CP12
225.0000 mg | ORAL_CAPSULE | Freq: Two times a day (BID) | ORAL | Status: DC
  Administered 2018-01-17 – 2018-01-18 (×2): 225 mg via ORAL
  Filled 2018-01-17 (×4): qty 1

## 2018-01-17 MED ORDER — DOBUTAMINE IN D5W 4-5 MG/ML-% IV SOLN
INTRAVENOUS | Status: AC
  Filled 2018-01-17: qty 250

## 2018-01-17 MED ORDER — ROCURONIUM BROMIDE 100 MG/10ML IV SOLN
INTRAVENOUS | Status: DC | PRN
  Administered 2018-01-17: 50 mg via INTRAVENOUS

## 2018-01-17 MED ORDER — HEPARIN SODIUM (PORCINE) 1000 UNIT/ML IJ SOLN
INTRAMUSCULAR | Status: DC | PRN
  Administered 2018-01-17: 6000 [IU] via INTRAVENOUS
  Administered 2018-01-17: 4000 [IU] via INTRAVENOUS
  Administered 2018-01-17: 14000 [IU] via INTRAVENOUS

## 2018-01-17 MED ORDER — FENTANYL CITRATE (PF) 100 MCG/2ML IJ SOLN
INTRAMUSCULAR | Status: DC | PRN
  Administered 2018-01-17: 25 ug via INTRAVENOUS
  Administered 2018-01-17: 75 ug via INTRAVENOUS

## 2018-01-17 MED ORDER — VITAMIN C 500 MG PO TABS
1000.0000 mg | ORAL_TABLET | Freq: Every day | ORAL | Status: DC
  Administered 2018-01-18: 1000 mg via ORAL
  Filled 2018-01-17 (×2): qty 2

## 2018-01-17 MED ORDER — HEPARIN SODIUM (PORCINE) 1000 UNIT/ML IJ SOLN
INTRAMUSCULAR | Status: AC
  Filled 2018-01-17: qty 1

## 2018-01-17 MED ORDER — PHENYLEPHRINE HCL 10 MG/ML IJ SOLN
INTRAVENOUS | Status: DC | PRN
  Administered 2018-01-17: 60 ug/min via INTRAVENOUS

## 2018-01-17 SURGICAL SUPPLY — 20 items
BAG SNAP BAND KOVER 36X36 (MISCELLANEOUS) ×3 IMPLANT
BLANKET WARM UNDERBOD FULL ACC (MISCELLANEOUS) ×3 IMPLANT
CATH MAPPNG PENTARAY F 2-6-2MM (CATHETERS) ×1 IMPLANT
CATH SMTCH THERMOCOOL SF DF (CATHETERS) ×3 IMPLANT
CATH SOUNDSTAR 3D IMAGING (CATHETERS) ×3 IMPLANT
CATH WEBSTER BI DIR CS D-F CRV (CATHETERS) ×3 IMPLANT
COVER SWIFTLINK CONNECTOR (BAG) ×3 IMPLANT
PACK EP LATEX FREE (CUSTOM PROCEDURE TRAY) ×2
PACK EP LF (CUSTOM PROCEDURE TRAY) ×1 IMPLANT
PAD DEFIB LIFELINK (PAD) ×3 IMPLANT
PATCH CARTO3 (PAD) ×3 IMPLANT
PENTARAY F 2-6-2MM (CATHETERS) ×3
SHEATH AVANTI 11F 11CM (SHEATH) ×3 IMPLANT
SHEATH BAYLIS SUREFLEX  M 8.5 (SHEATH) ×4
SHEATH BAYLIS SUREFLEX M 8.5 (SHEATH) ×2 IMPLANT
SHEATH BAYLIS TRANSSEPTAL 98CM (NEEDLE) ×3 IMPLANT
SHEATH PINNACLE 7F 10CM (SHEATH) ×3 IMPLANT
SHEATH PINNACLE 8F 10CM (SHEATH) ×6 IMPLANT
SHEATH PINNACLE 9F 10CM (SHEATH) ×6 IMPLANT
TUBING SMART ABLATE COOLFLOW (TUBING) ×3 IMPLANT

## 2018-01-17 NOTE — H&P (Signed)
Desiree Schultz has presented today for surgery, with the diagnosis of atrial fibrillation.  The various methods of treatment have been discussed with the patient and family. After consideration of risks, benefits and other options for treatment, the patient has consented to  Procedure(s): Catheter ablation as a surgical intervention .  Risks include but not limited to bleeding, tamponade, heart block, stroke, damage to surrounding organs, among others. The patient's history has been reviewed, patient examined, no change in status, stable for surgery.  I have reviewed the patient's chart and labs.  Questions were answered to the patient's satisfaction.    Will Curt Bears, MD 01/17/2018 7:11 AM

## 2018-01-17 NOTE — Progress Notes (Signed)
Site area: Left groin fem venous sheath Site prior: Level 0 Manual Pressure applied for: 20 min Patient Status During pull: Calm, stable Post Pull site: Level 0 Post pull instructions given Post pull pulses present: Yes Dressing applied Bedrest begins at: 12:00 Comments: No complications

## 2018-01-17 NOTE — Anesthesia Postprocedure Evaluation (Signed)
Anesthesia Post Note  Patient: Desiree Schultz  Procedure(s) Performed: ATRIAL FIBRILLATION ABLATION (N/A )     Patient location during evaluation: PACU Anesthesia Type: General Level of consciousness: sedated Pain management: pain level controlled Vital Signs Assessment: post-procedure vital signs reviewed and stable Respiratory status: spontaneous breathing and respiratory function stable Cardiovascular status: stable Postop Assessment: no apparent nausea or vomiting Anesthetic complications: no    Last Vitals:  Vitals:   01/17/18 1130 01/17/18 1135  BP: 103/62 103/61  Pulse: 73 73  Resp: (!) 28 (!) 26  Temp:    SpO2: 92% 93%    Last Pain:  Vitals:   01/17/18 1129  TempSrc: Temporal  PainSc:                  Horton Ellithorpe DANIEL

## 2018-01-17 NOTE — Progress Notes (Addendum)
Patient placed on nasal CPAP for HS using room air and 11 cmH20.  Patient is familiar with equipment and procedure and tolerated well.

## 2018-01-17 NOTE — Transfer of Care (Signed)
Immediate Anesthesia Transfer of Care Note  Patient: Desiree Schultz  Procedure(s) Performed: ATRIAL FIBRILLATION ABLATION (N/A )  Patient Location: Cath Lab  Anesthesia Type:General  Level of Consciousness: drowsy and patient cooperative  Airway & Oxygen Therapy: Patient Spontanous Breathing and Patient connected to nasal cannula oxygen  Post-op Assessment: Report given to RN and Post -op Vital signs reviewed and stable  Post vital signs: Reviewed and stable  Last Vitals:  Vitals Value Taken Time  BP 114/68 01/17/2018 10:55 AM  Temp    Pulse 78 01/17/2018 10:55 AM  Resp 24 01/17/2018 10:55 AM  SpO2 91 % 01/17/2018 10:55 AM  Vitals shown include unvalidated device data.  Last Pain:  Vitals:   01/17/18 1056  TempSrc:   PainSc: 0-No pain      Patients Stated Pain Goal: 3 (99/77/41 4239)  Complications: No apparent anesthesia complications

## 2018-01-17 NOTE — Anesthesia Procedure Notes (Addendum)
Procedure Name: Intubation Date/Time: 01/17/2018 7:45 AM Performed by: Orlie Dakin, CRNA Pre-anesthesia Checklist: Patient identified, Emergency Drugs available, Suction available, Patient being monitored and Timeout performed Patient Re-evaluated:Patient Re-evaluated prior to induction Oxygen Delivery Method: Circle system utilized Preoxygenation: Pre-oxygenation with 100% oxygen Induction Type: IV induction Ventilation: Mask ventilation without difficulty Laryngoscope Size: Miller and 3 Grade View: Grade I Tube type: Oral Tube size: 7.0 mm Number of attempts: 1 Airway Equipment and Method: Stylet Placement Confirmation: ETT inserted through vocal cords under direct vision,  positive ETCO2 and breath sounds checked- equal and bilateral Secured at: 23 cm Tube secured with: Tape Dental Injury: Teeth and Oropharynx as per pre-operative assessment  Comments: Pre-op noted large tongue, thick short neck, opens wide, MP 2.  IV succ for induction.  DL noted large tongue, tonsils.  4x4s bite block used.

## 2018-01-17 NOTE — Progress Notes (Signed)
Site area: rt groin fv sheaths x2 Site Prior to Removal:  Level 0 Pressure Applied For: 20 minutes Manual:   yes Patient Status During Pull:  stable Post Pull Site:  Level 0 Post Pull Instructions Given:  yes Post Pull Pulses Present: palpable Dressing Applied:  Gauze and tegaderm Bedrest begins @  Comments:

## 2018-01-17 NOTE — Anesthesia Preprocedure Evaluation (Addendum)
Anesthesia Evaluation  Patient identified by MRN, date of birth, ID band Patient awake    Reviewed: Allergy & Precautions, NPO status , Patient's Chart, lab work & pertinent test results  History of Anesthesia Complications (+) PONV  Airway Mallampati: II  TM Distance: >3 FB     Dental  (+) Teeth Intact, Dental Advisory Given   Pulmonary sleep apnea , former smoker,    breath sounds clear to auscultation       Cardiovascular + dysrhythmias  Rhythm:Regular Rate:Normal     Neuro/Psych    GI/Hepatic negative GI ROS, Neg liver ROS,   Endo/Other  diabetesHypothyroidism   Renal/GU negative Renal ROS     Musculoskeletal   Abdominal   Peds  Hematology  (+) anemia ,   Anesthesia Other Findings   Reproductive/Obstetrics                            Anesthesia Physical Anesthesia Plan  ASA: III  Anesthesia Plan: General   Post-op Pain Management:    Induction: Intravenous  PONV Risk Score and Plan: Treatment may vary due to age or medical condition, Ondansetron, Dexamethasone and Midazolam  Airway Management Planned: Oral ETT  Additional Equipment:   Intra-op Plan:   Post-operative Plan: Extubation in OR  Informed Consent: I have reviewed the patients History and Physical, chart, labs and discussed the procedure including the risks, benefits and alternatives for the proposed anesthesia with the patient or authorized representative who has indicated his/her understanding and acceptance.   Dental advisory given  Plan Discussed with: CRNA and Anesthesiologist  Anesthesia Plan Comments:         Anesthesia Quick Evaluation

## 2018-01-18 DIAGNOSIS — G4733 Obstructive sleep apnea (adult) (pediatric): Secondary | ICD-10-CM | POA: Diagnosis not present

## 2018-01-18 DIAGNOSIS — E669 Obesity, unspecified: Secondary | ICD-10-CM | POA: Diagnosis not present

## 2018-01-18 DIAGNOSIS — I480 Paroxysmal atrial fibrillation: Secondary | ICD-10-CM

## 2018-01-18 DIAGNOSIS — I10 Essential (primary) hypertension: Secondary | ICD-10-CM | POA: Diagnosis not present

## 2018-01-18 LAB — GLUCOSE, CAPILLARY
GLUCOSE-CAPILLARY: 101 mg/dL — AB (ref 65–99)
GLUCOSE-CAPILLARY: 111 mg/dL — AB (ref 65–99)

## 2018-01-18 NOTE — Progress Notes (Signed)
Progress Note  Patient Name: Desiree Schultz Date of Encounter: 01/18/2018  Primary Cardiologist: SK  Primary Electrophysiologist: SK/WC   Patient Profile     46 y.o. female  With PCOS and atrial fib  Now s/p RFCA  Subjective   witout complaint and chest pain  Questions re diabetes    Inpatient Medications    Scheduled Meds: . apixaban  5 mg Oral BID  . cholecalciferol  5,000 Units Oral Daily  . escitalopram  20 mg Oral Daily  . insulin aspart  0-15 Units Subcutaneous TID WC  . levothyroxine  150 mcg Oral QAC breakfast  . loratadine  10 mg Oral Daily  . metFORMIN  500 mg Oral TID WC  . metoprolol tartrate  50 mg Oral BID  . propafenone  225 mg Oral BID  . sodium chloride flush  3 mL Intravenous Q12H  . spironolactone  25 mg Oral Daily  . vitamin C with rose hips  1,000 mg Oral Daily   Continuous Infusions: . sodium chloride    . sodium chloride     PRN Meds: sodium chloride, acetaminophen, ondansetron (ZOFRAN) IV, sodium chloride flush   Vital Signs    Vitals:   01/17/18 1745 01/17/18 2106 01/17/18 2121 01/18/18 0617  BP: 122/72 130/67  121/71  Pulse: 78 70 72 64  Resp: (!) 22 19 18 11   Temp:  98.6 F (37 C)  98.2 F (36.8 C)  TempSrc:  Oral  Oral  SpO2: 96% 97% 96% 98%  Weight:    275 lb 14.4 oz (125.1 kg)  Height:        Intake/Output Summary (Last 24 hours) at 01/18/2018 0848 Last data filed at 01/17/2018 1056 Gross per 24 hour  Intake 550 ml  Output -  Net 550 ml   Filed Weights   01/17/18 0551 01/17/18 1557 01/18/18 0617  Weight: 275 lb (124.7 kg) 281 lb 12 oz (127.8 kg) 275 lb 14.4 oz (125.1 kg)    Telemetry    Sinus - Personally Reviewed  ECG     d  Physical Exam    GEN: No acute distress.   Neck: JVD flat Cardiac: RRR, no  murmurs, rubs, or gallops.  Respiratory: Clear to auscultation bilaterally. GI: Soft, nontender, non-distended  MS:  edema; No deformity. Neuro:  Nonfocal  Psych: Normal affect  Skin Warm and dry   Labs     ChemistryNo results for input(s): NA, K, CL, CO2, GLUCOSE, BUN, CREATININE, CALCIUM, PROT, ALBUMIN, AST, ALT, ALKPHOS, BILITOT, GFRNONAA, GFRAA, ANIONGAP in the last 168 hours.   HematologyNo results for input(s): WBC, RBC, HGB, HCT, MCV, MCH, MCHC, RDW, PLT in the last 168 hours.  Cardiac EnzymesNo results for input(s): TROPONINI in the last 168 hours. No results for input(s): TROPIPOC in the last 168 hours.   BNPNo results for input(s): BNP, PROBNP in the last 168 hours.   DDimer No results for input(s): DDIMER in the last 168 hours.   Radiology    No results found.  Cardiac Studies     Device Interrogation          Assessment & Plan    Atrial fib  S/p PVI  Sinus bradycardia  Left ventricular enlargement   Polycystic ovarian syndrome  Hypertension  Obstructive sleep apnea   For discharge today  Continue propafenone for about 6 weeks   Has questions about DM  Referred to PCP  Return to Clinic already scheduled     Signed, Virl Axe,  MD  01/18/2018, 8:48 AM

## 2018-01-18 NOTE — Discharge Summary (Addendum)
Discharge Summary    Patient ID: Desiree Schultz,  MRN: [id number], DOB/AGE: 48-Dec-1972 48 y.o.  Admit date: 01/17/2018 Discharge date: 01/18/2018  Primary Care Provider: Dian Queen Primary Cardiologist: Virl Axe, MD  Discharge Diagnoses    Principal Problem:   Paroxysmal A-fib [facility]) Active Problems:   OSA (obstructive sleep apnea)   Obesity   Polycystic disease, ovaries   Prediabetes   Metabolic syndrome   Controlled type 2 diabetes mellitus without complication, without long-term current use of insulin (HCC)   Allergies Allergies  Allergen Reactions  . Multaq [Dronedarone] Swelling    Per patient  . Benadryl [Diphenhydramine Hcl] Itching  . Fish Oil     Makes hands raw, mouth and female area   . Influenza Vaccines     Gives pt the flu and site of injection swells and hot  . Latex     Burns skin  . Oxycodone Nausea And Vomiting  . Tape     Burns skin, band-aid, ekg stickers  . Sulfa Antibiotics Rash    Diagnostic Studies/Procedures    Afib ablation 01/17/18: PROCEDURES: 1. Comprehensive electrophysiologic study. 2. Coronary sinus pacing and recording. 3. Three-dimensional mapping of atrial fibrillation with additional mapping and ablation of a second discrete focus 4. Ablation of atrial fibrillation with additional mapping and ablation of a second discrete focus 5. Intracardiac echocardiography. 6. Transseptal puncture of an intact septum. 7. Arrhythmia induction with pacing with dobutamine infusion    History of Present Illness     Desiree Schultz is a 47 y.o. female with a PMH significant for Afib, previously on xarelto and controlled on rythmol, DM, metabolic syndrome, PCOS, and depression. She was seen in EP clinic for consideration of atrial fibrillaiton ablation. Her Afib has been controlled on Rythmol.  She was concerned about the cost of the medication. She has been on Xarelto in the past but due to low stroke risk has been taken off.  She  exercises on a regular basis and recently lost 30+ pounds.  At her last clinic visit with Dr. Curt Bears, she denied symptoms of palpitations, chest pain, shortness of breath, orthopnea, PND, lower extremity edema, claudication, dizziness, presyncope, syncope, bleeding, or neurologic sequela. The patient was tolerating medications without difficulties.   She presented to [facility] on [facility]/17[facility]18 for afib ablation.   Hospital Course     Consultants: none  She tolerated the procedure well. She was discharged on propafenone for about 6 weeks. She was discharged on eliquis post ablation.  Groin site is C/D/I without hematoma.   HTN is well-controlled on lopressor and spironolactone.   Will defer DM management to PCP.  _____________  Discharge Vitals Blood pressure 121/71, pulse 64, temperature 98.2 F (36.8 C), temperature source Oral, resp. rate 11, height 5\' 5"  (1.651 m), weight 275 lb 14.4 oz (125.1 kg), last menstrual period 01/07/2018, SpO2 98 %.  Filed Weights   01/17/18 0551 01/17/18 1557 01/18/18 0617  Weight: 275 lb (124.7 kg) 281 lb 12 oz (127.8 kg) 275 lb 14.4 oz (125.1 kg)    Labs & Radiologic Studies    CBC No results for input(s): WBC, NEUTROABS, HGB, HCT, MCV, PLT in the last 72 hours. Basic Metabolic Panel No results for input(s): NA, K, CL, CO2, GLUCOSE, BUN, CREATININE, CALCIUM, MG, PHOS in the last 72 hours. Liver Function Tests No results for input(s): AST, ALT, ALKPHOS, BILITOT, PROT, ALBUMIN in the last 72 hours. No results for input(s): LIPASE, AMYLASE in the last 72  hours. Cardiac Enzymes No results for input(s): CKTOTAL, CKMB, CKMBINDEX, TROPONINI in the last 72 hours. BNP Invalid input(s): POCBNP D-Dimer No results for input(s): DDIMER in the last 72 hours. Hemoglobin A1C Recent Labs    01/17/18 1653  HGBA1C 5.4   Fasting Lipid Panel No results for input(s): CHOL, HDL, LDLCALC, TRIG, CHOLHDL, LDLDIRECT in the last 72 hours. Thyroid Function Tests No results  for input(s): TSH, T4TOTAL, T3FREE, THYROIDAB in the last 72 hours.  Invalid input(s): FREET3 _____________  Ct Cardiac Morph/pulm Vein W/cm&w/o Ca Score  Addendum Date: 01/14/2018   ADDENDUM REPORT: 01/14/2018 12:14 CLINICAL DATA:  47 year old obese female with atrial fibrillation scheduled for an ablation. EXAM: Cardiac CT/CTA TECHNIQUE: The patient was scanned on a Siemens Somatom scanner. FINDINGS: A 120 kV prospective scan was triggered in the descending thoracic aorta at 111 HU's. Gantry rotation speed was 280 msecs and collimation was .9 mm. No beta blockade and no NTG was given. The 3D data set was reconstructed in 5% intervals of the 60-80 % of the R-R cycle. Diastolic phases were analyzed on a dedicated work station using MPR, MIP and VRT modes. The patient received 80 cc of contrast. There is normal pulmonary vein drainage into the left atrium (2 on the right and 2 on the left) with ostial measurements as follows: RUPV: 22.9 x 17.4 mm RLPV: 21.4 x 19.4 mm LUPV: 20.5 x 10.5 mm LLPV: 22.1 x 8.4 mm The left atrial appendage is medium size with chicken wing morphology and one lobe. There is no thrombus in the left atrial appendage. The esophagus runs to the left from the left atrial midline and is in the proximity to the [place] and LLPV. Aorta:  Normal caliber.  No dissection or calcifications. Aortic Valve:  Trileaflet.  No calcifications. Coronary Arteries: Normal coronary origin. Right dominance. Calcium score is 0. Proximal and mid portion of the vessels have no obvious plaque. More detailed evaluation is limited secondary to patients size. IMPRESSION: 1. There is normal pulmonary vein drainage into the left atrium. 2. The left atrial appendage is medium size with chicken wing morphology and one lobe. There is no thrombus in the left atrial appendage. 3. The esophagus runs to the left from the left atrial midline and is in the proximity to the [place] and LLPV. 4. Normal coronary origin. Right dominance.  Calcium score is 0. Proximal and mid portion of the vessels have no obvious plaque. More detailed evaluation is limited secondary to patients size. Electronically Signed   By: Ena Dawley   On: 01/14/2018 12:14   Result Date: 01/14/2018 EXAM: OVER-READ INTERPRETATION  CT CHEST The following report is an over-read performed by radiologist Dr. Rolm Baptise of [facility], [place] on 01/14/2018. This over-read does not include interpretation of cardiac or coronary anatomy or pathology. The coronary CTA interpretation by the cardiologist is attached. COMPARISON:  04/02/2014 FINDINGS: Vascular: Heart is normal size.  Visualized aorta is normal caliber. Mediastinum/Nodes: No adenopathy in the lower mediastinum or hila. Lungs/Pleura: Minimal dependent atelectasis.  No effusions. Upper Abdomen: Imaging into the upper abdomen shows no acute findings. Musculoskeletal: Chest wall soft tissues are unremarkable. No acute bony abnormality. IMPRESSION: No acute or significant extracardiac abnormality. Electronically Signed: By: Rolm Baptise M.D. On: 01/14/2018 11:57   Disposition   Pt is being discharged home today in good condition.  Follow-up Plans & Appointments    Follow-up Information    [place] Follow up on 02/13/2018.   Specialty:  Cardiology Why:  at 8:30AM Contact information: [address] [id number] [place] [id number]         Discharge Instructions    Diet - low sodium heart healthy   Complete by:  As directed    Increase activity slowly   Complete by:  As directed       Discharge Medications   Allergies as of 01/18/2018      Reactions   Multaq [dronedarone] Swelling   Per patient   Benadryl [diphenhydramine Hcl] Itching   Fish Oil    Makes hands raw, mouth and female area    Influenza Vaccines    Gives pt the flu and site of injection swells and hot   Latex    Burns skin   Oxycodone Nausea And Vomiting    Tape    Burns skin, band-aid, ekg stickers   Sulfa Antibiotics Rash      Medication List    TAKE these medications   cetirizine 10 MG tablet Commonly known as:  ZYRTEC Take 1 tablet (10 mg total) by mouth daily.   ELIQUIS 5 MG Tabs tablet Generic drug:  apixaban TAKE 1 TABLET BY MOUTH TWICE DAILY   escitalopram 20 MG tablet Commonly known as:  LEXAPRO Take 1 tablet (20 mg total) by mouth daily.   levothyroxine 150 MCG tablet Commonly known as:  SYNTHROID Take 1 tablet (150 mcg total) by mouth daily before breakfast.   metFORMIN 500 MG 24 hr tablet Commonly known as:  GLUCOPHAGE-XR Take 2 tablets (1,000 mg total) by mouth daily. What changed:    how much to take  when to take this   metoprolol tartrate 50 MG tablet Commonly known as:  LOPRESSOR Take one tablet (50 mg total) one hour before cardiac CT testing   mometasone 50 MCG/ACT nasal spray Commonly known as:  NASONEX Place 2 sprays into the nose daily as needed (allergies).   propafenone 225 MG 12 hr capsule Commonly known as:  RYTHMOL SR Take 225 mg by mouth 2 (two) times daily.   spironolactone 50 MG tablet Commonly known as:  ALDACTONE 1/2 tab by mouth daily in morning What changed:    how much to take  how to take this  when to take this  additional instructions   traMADol 50 MG tablet Commonly known as:  ULTRAM Take 1-2 tablets (50-100 mg total) by mouth 3 (three) times daily as needed.   vitamin C with rose hips 1000 MG tablet Take 1,000 mg by mouth daily.   Vitamin D3 5000 units Caps Take 5,000 Units by mouth daily.        Outstanding Labs/Studies   Follow up appt, office will call  Duration of Discharge Encounter   Greater than 30 minutes including physician time.  Signed, [place], [place] 01/18/2018, 9:29 AM  See separate note

## 2018-01-18 NOTE — Discharge Instructions (Signed)
No driving for 4 days. No lifting over 5 lbs for 1 week. No sexual activity for 1 week. You may return to work in 1 week. Keep procedure site clean & dry. If you notice increased pain, swelling, bleeding or pus, call/return!  You may shower, but no soaking baths/hot tubs/pools for 1 week.  ° ° °You have an appointment set up with the Atrial Fibrillation Clinic.  Multiple studies have shown that being followed by a dedicated atrial fibrillation clinic in addition to the standard care you receive from your other physicians improves health. We believe that enrollment in the atrial fibrillation clinic will allow us to better care for you.  ° °The phone number to the Atrial Fibrillation Clinic is [phone]. The clinic is staffed Monday through Friday from 8:30am to 5pm. ° °Parking Directions: The clinic is located in the Heart and Vascular Building connected to [facility]. °1)From [address] turn on to [address] and go to the 3rd entrance  (Heart and Vascular entrance) on the right. °2)Look to the right for Heart &Vascular Parking Garage. °3)A code for the entrance is required please call the clinic to receive this.   °4)Take the elevators to the 1st floor. Registration is in the room with the glass walls at the end of the hallway. ° °If you have any trouble parking or locating the clinic, please don’t hesitate to call [phone]. ° ° °

## 2018-01-20 ENCOUNTER — Telehealth: Payer: Self-pay | Admitting: Cardiology

## 2018-01-20 ENCOUNTER — Encounter (HOSPITAL_COMMUNITY): Payer: Self-pay | Admitting: Cardiology

## 2018-01-20 MED FILL — Heparin Sod (Porcine)-NaCl IV Soln 1000 Unit/500ML-0.9%: INTRAVENOUS | Qty: 2500 | Status: AC

## 2018-01-20 NOTE — Telephone Encounter (Signed)
New message    Pt wants to know if she will pass a drug test after having anesthesia?

## 2018-01-20 NOTE — Telephone Encounter (Signed)
Pt concerned about passing a drug test tomorrow for a job she really wants.  Advised anesthesia should not cause her to fail a drug test.  Pt appreciative for call back.

## 2018-01-22 ENCOUNTER — Telehealth: Payer: Self-pay | Admitting: Cardiology

## 2018-01-22 NOTE — Telephone Encounter (Signed)
Pt reports pain/discomfort at groin site post ablation. Denies bruising, swelling or knot formation at the area. Pt advised this is not abnormal and to continue to monitor.  Advised to call office back if pain changes/worsens, bruising begins and begins to spead down leg/up abdomen, knot forms and grows larger than nickel/quarter size. Patient verbalized understanding and agreeable to plan.    She also asks for a note to return to work on 02/03/18.  We put her out of work thru the 24th and she took the next week off herself for recovery (she has extended recovery needs post anesthesia). I will fax a note, tomorrow, informing her office she is cleared to return to work on 6/3.  She is very appreciative of the help with this.

## 2018-01-22 NOTE — Telephone Encounter (Signed)
New Message   Pain on right side groin area. Pt would like to know what she should do. Please call

## 2018-01-24 ENCOUNTER — Encounter: Payer: Self-pay | Admitting: *Deleted

## 2018-01-24 NOTE — Telephone Encounter (Signed)
Note faxed, confirmation received.

## 2018-02-01 IMAGING — US US BREAST*R* LIMITED INC AXILLA
1 series · 10 of 10 positions shown · non-contrast
Comparison: Previous exam(s).

CLINICAL DATA: Screening recall for possible mass in the right
breast.

EXAM:
2D DIGITAL DIAGNOSTIC RIGHT MAMMOGRAM WITH CAD AND ADJUNCT TOMO
ULTRASOUND RIGHT BREAST

[Series 1: us breast*right* limited inc axilla · 0.07mm/px · 10 of 10 slices shown]
[im 1/10]
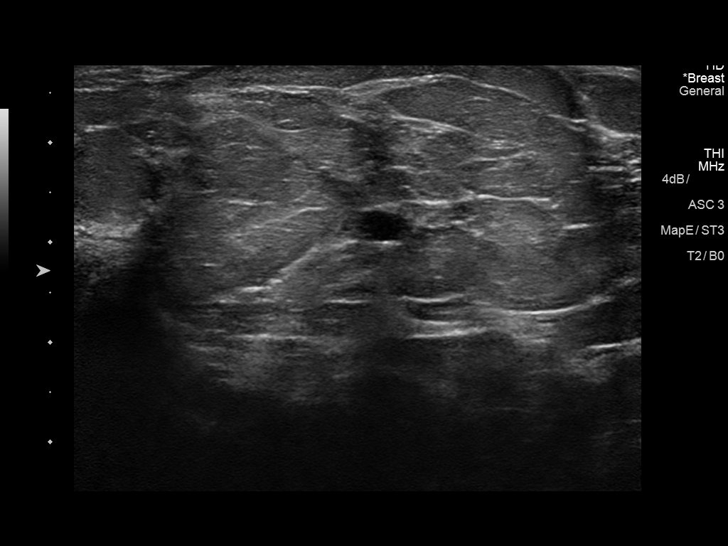
[im 2/10]
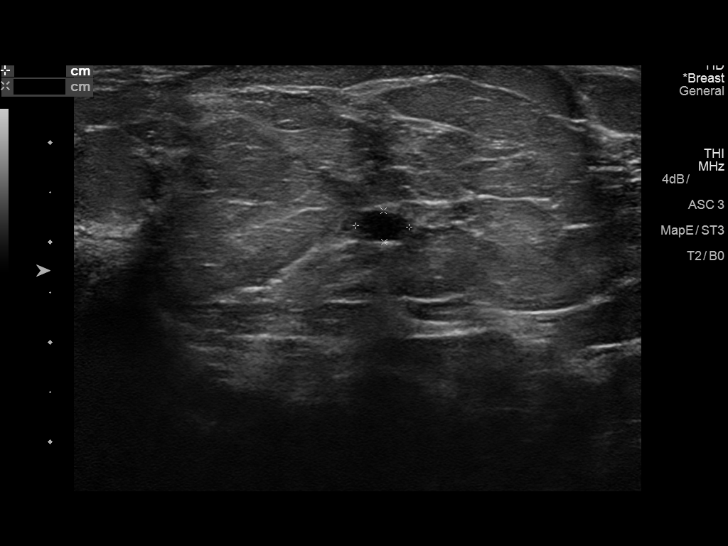
[im 3/10]
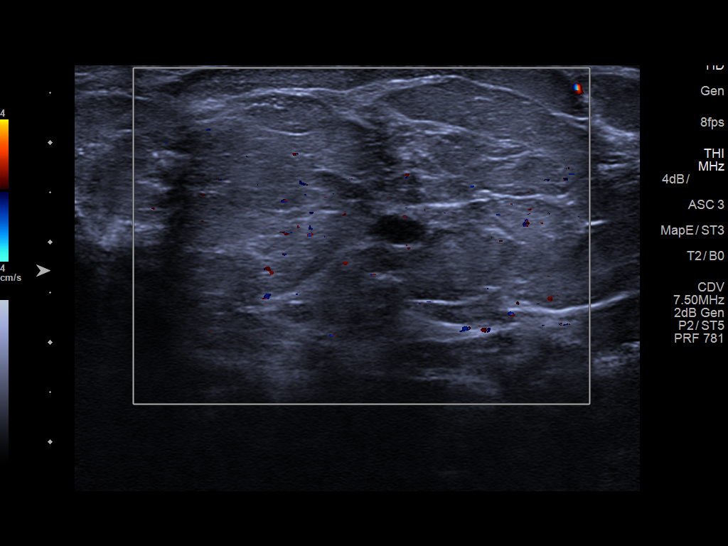
[im 4/10]
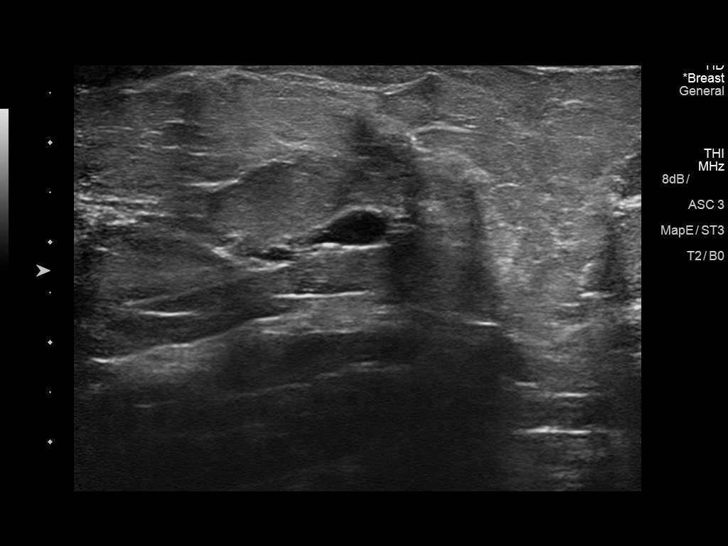
[im 5/10]
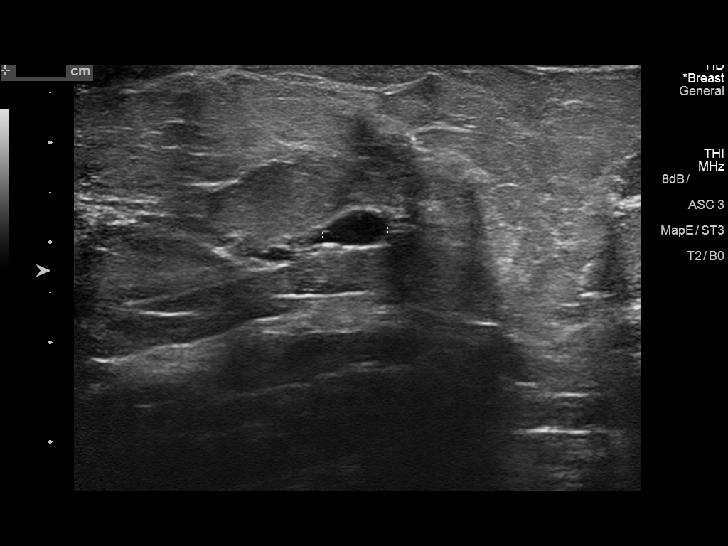
[im 6/10]
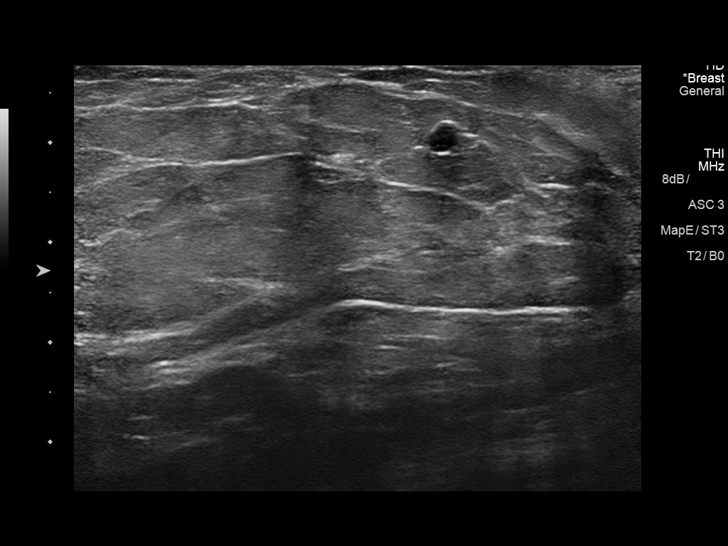
[im 7/10]
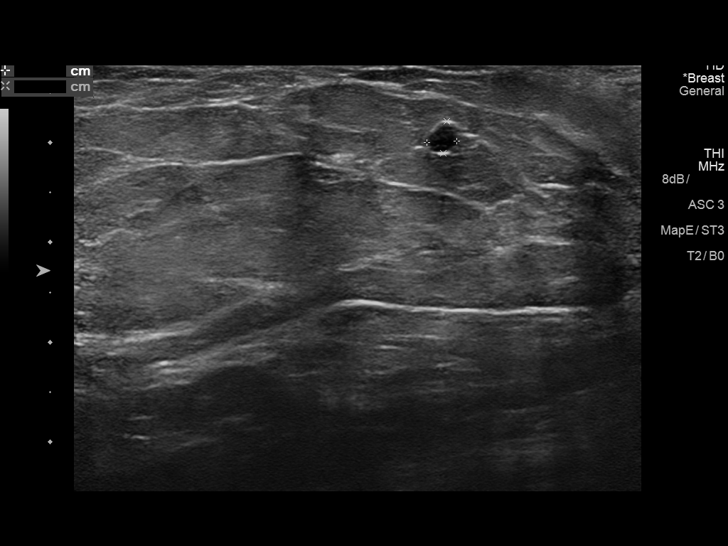
[im 8/10]
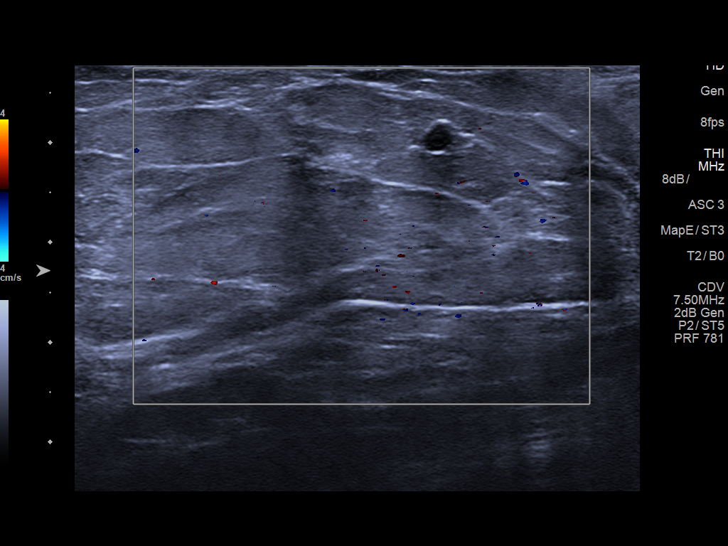
[im 9/10]
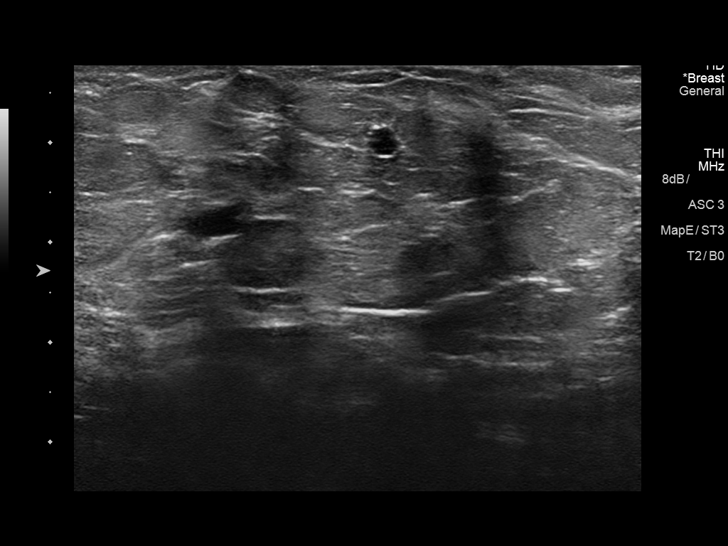
[im 10/10]
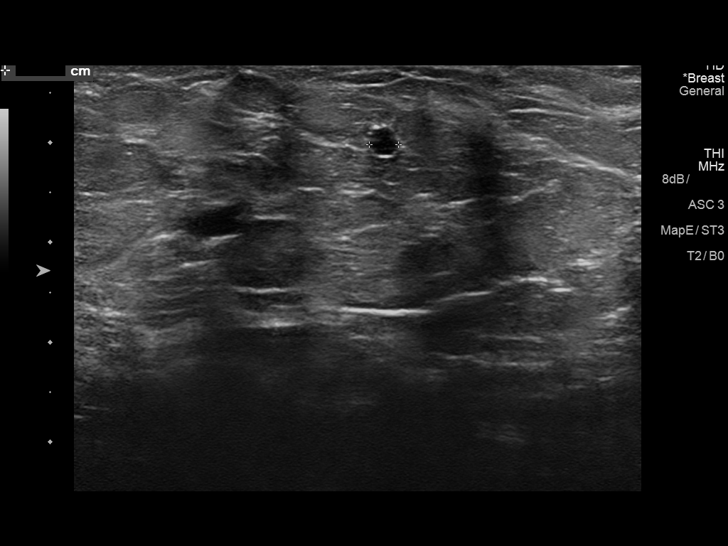

[10 of 10 positions shown; findings below may reference images not displayed]

ACR Breast Density Category b: There are scattered areas of
fibroglandular density.
FINDINGS: The possible mass in the lower inner aspect of the right breast
persists on the diagnostic images, best seen on the MLO image as an
oval circumscribed mass in the lower inner quadrant.

There are no other suspicious masses, no areas of architectural
distortion and no suspicious or new breast calcifications.

Mammographic images were processed with CAD.

On physical exam, no mass is palpated in the inner right breast.

Targeted ultrasound is performed, showing several small cysts. In
the 3 o'clock position, 2 cm the nipple, there is an oval cyst
measuring 7 x 3 x 5 mm, consistent in size, shape and location to
the mammographic finding. There are no solid masses or suspicious
lesions.
IMPRESSION: 1. No evidence of breast malignancy.
2. Small benign right breast cysts.

RECOMMENDATION:
Screening mammogram in one year.(Code:[id number])

I have discussed the findings and recommendations with the patient.
Results were also provided in writing at the conclusion of the
visit. If applicable, a reminder letter will be sent to the patient
regarding the next appointment.

BI-RADS CATEGORY  2: Benign.

## 2018-02-13 ENCOUNTER — Ambulatory Visit (HOSPITAL_COMMUNITY)
Admission: RE | Admit: 2018-02-13 | Discharge: 2018-02-13 | Disposition: A | Discharge status: Home / Self Care | Payer: BLUE CROSS/BLUE SHIELD | Source: Ambulatory Visit | Attending: Nurse Practitioner | Admitting: Nurse Practitioner

## 2018-02-13 ENCOUNTER — Other Ambulatory Visit: Payer: Self-pay

## 2018-02-13 ENCOUNTER — Encounter (HOSPITAL_COMMUNITY): Payer: Self-pay | Admitting: Nurse Practitioner

## 2018-02-13 VITALS — BP 142/84 | HR 72 | Ht 65.0 in | Wt 279.0 lb

## 2018-02-13 DIAGNOSIS — E282 Polycystic ovarian syndrome: Secondary | ICD-10-CM | POA: Insufficient documentation

## 2018-02-13 DIAGNOSIS — Z6841 Body Mass Index (BMI) 40.0 and over, adult: Secondary | ICD-10-CM | POA: Insufficient documentation

## 2018-02-13 DIAGNOSIS — I480 Paroxysmal atrial fibrillation: Secondary | ICD-10-CM | POA: Insufficient documentation

## 2018-02-13 DIAGNOSIS — Z87891 Personal history of nicotine dependence: Secondary | ICD-10-CM | POA: Diagnosis not present

## 2018-02-13 DIAGNOSIS — E669 Obesity, unspecified: Secondary | ICD-10-CM | POA: Insufficient documentation

## 2018-02-13 DIAGNOSIS — Z79890 Hormone replacement therapy (postmenopausal): Secondary | ICD-10-CM | POA: Insufficient documentation

## 2018-02-13 DIAGNOSIS — Z7901 Long term (current) use of anticoagulants: Secondary | ICD-10-CM | POA: Insufficient documentation

## 2018-02-13 DIAGNOSIS — Z7951 Long term (current) use of inhaled steroids: Secondary | ICD-10-CM | POA: Insufficient documentation

## 2018-02-13 DIAGNOSIS — E119 Type 2 diabetes mellitus without complications: Secondary | ICD-10-CM | POA: Insufficient documentation

## 2018-02-13 DIAGNOSIS — G4733 Obstructive sleep apnea (adult) (pediatric): Secondary | ICD-10-CM | POA: Insufficient documentation

## 2018-02-13 DIAGNOSIS — E559 Vitamin D deficiency, unspecified: Secondary | ICD-10-CM | POA: Insufficient documentation

## 2018-02-13 DIAGNOSIS — E039 Hypothyroidism, unspecified: Secondary | ICD-10-CM | POA: Diagnosis not present

## 2018-02-13 DIAGNOSIS — Z7984 Long term (current) use of oral hypoglycemic drugs: Secondary | ICD-10-CM | POA: Diagnosis not present

## 2018-02-13 DIAGNOSIS — Z79899 Other long term (current) drug therapy: Secondary | ICD-10-CM | POA: Diagnosis not present

## 2018-02-13 DIAGNOSIS — F419 Anxiety disorder, unspecified: Secondary | ICD-10-CM | POA: Diagnosis not present

## 2018-02-13 DIAGNOSIS — F329 Major depressive disorder, single episode, unspecified: Secondary | ICD-10-CM | POA: Insufficient documentation

## 2018-02-13 NOTE — Progress Notes (Signed)
Electrophysiology Office Note   Date:  02/13/2018   ID:  Desiree Schultz, DOB 02-06-1971, MRN [id number]  PCP:  Dian Queen, MD  Cardiologist:  Gaynelle Cage Electrophysiologist: Barnetta Hammersmith Meredith Leeds, MD    No chief complaint on file.    History of Present Illness: Desiree Schultz is a 47 y.o. female who is being seen today for the evaluation of atrial fibrillaiton at the request of Desiree Schultz. Presenting today for electrophysiology evaluation.  She has a history of paroxysmal atrial fibrillation that is been controlled on Rythmol.  She is concerned about the cost of the medication.   she has been on Xarelto in the past but due to low stroke risk has been taken off.  She exercises on a regular basis tendons recently lost 30+ pounds. She had an AF ablation 01/17/18.  She has done well since ablation.  She does note that 8 days after her procedure, she was working in the yard and had irregular heartbeats.  This reverted back to normal rhythm and she has had no further episodes of atrial fibrillation.  She says that her energy level has improved.  She is a little bit more jittery, but it is also improved over the last few weeks.  Today, denies symptoms of palpitations, chest pain, shortness of breath, orthopnea, PND, lower extremity edema, claudication, dizziness, presyncope, syncope, bleeding, or neurologic sequela. The patient is tolerating medications without difficulties.    Past Medical History:  Diagnosis Date  . Allergic rhinitis   . Anemia   . Atrial fibrillation ([place])    2006  . Bronchial breathing   . Chronic anxiety   . Depression   . Diabetes mellitus without complication ([place])   . Dysrhythmia   . Elevated blood pressure    On therapy. Probably hypertension  . Hx of cardiovascular stress test    a. Myoview 10/13:  no ischemia; EF 57%  . Hypothyroid   . Menorrhagia    2011  . Obesity (BMI 30-39.9)   . OSA (obstructive sleep apnea)    on CPAP  . PCOS  (polycystic ovarian syndrome)   . PONV (postoperative nausea and vomiting)    C/o motion sickness  . Prediabetes   . Vitamin D deficiency    Past Surgical History:  Procedure Laterality Date  . ATRIAL FIBRILLATION ABLATION N/A 01/17/2018   Procedure: ATRIAL FIBRILLATION ABLATION;  Surgeon: Constance Haw, MD;  Location: [facility];  Service: Cardiovascular;  Laterality: N/A;  . BLADDER SURGERY  1997   interstitial cystitis   . BREAST REDUCTION SURGERY  06/2013  . BREAST SURGERY     reduction  . DILATION AND CURETTAGE, DIAGNOSTIC / THERAPEUTIC    . ENDOMETRIAL ABLATION    . ENDOMETRIAL ABLATION  08/2010  . HAND SURGERY  2006   due to cat bite  . KNEE ARTHROSCOPY WITH MEDIAL MENISECTOMY Left 04/06/2015   Procedure: LEFT KNEE ARTHROSCOPY WITH PARTIAL MEDIAL MENISCECTOMY;  Surgeon: Leandrew Koyanagi, MD;  Location: [facility];  Service: Orthopedics;  Laterality: Left;  . OVARIAN CYST REMOVAL  04/28/13  . REDUCTION MAMMAPLASTY Bilateral 2014  . RENAL BIOPSY, OPEN  2006  . TONSILLECTOMY  2007     Current Outpatient Medications  Medication Sig Dispense Refill  . Ascorbic Acid (VITAMIN C WITH ROSE HIPS) 1000 MG tablet Take 1,000 mg by mouth daily.     . cetirizine (ZYRTEC) 10 MG tablet Take 1 tablet (10 mg total) by mouth daily. [place]  tablet 11  . Cholecalciferol (VITAMIN D3) 5000 units CAPS Take 5,000 Units by mouth daily.    Marland Kitchen ELIQUIS 5 MG TABS tablet TAKE 1 TABLET BY MOUTH TWICE DAILY 60 tablet 3  . escitalopram (LEXAPRO) 20 MG tablet Take 1 tablet (20 mg total) by mouth daily. 30 tablet 11  . levothyroxine (SYNTHROID) 150 MCG tablet Take 1 tablet (150 mcg total) by mouth daily before breakfast. 30 tablet 2  . metFORMIN (GLUCOPHAGE-XR) 500 MG 24 hr tablet Take 2 tablets (1,000 mg total) by mouth daily. (Patient taking differently: Take 1,000 mg by mouth 2 (two) times daily. ) 60 tablet 1  . mometasone (NASONEX) 50 MCG/ACT nasal spray Place 2 sprays into the nose daily as needed  (allergies).     . propafenone (RYTHMOL SR) 225 MG 12 hr capsule Take 225 mg by mouth 2 (two) times daily.    Marland Kitchen spironolactone (ALDACTONE) 50 MG tablet 1/2 tab by mouth daily in morning (Patient taking differently: Take 25 mg by mouth daily. ) 45 tablet 2  . traMADol (ULTRAM) 50 MG tablet Take 1-2 tablets (50-100 mg total) by mouth 3 (three) times daily as needed. 30 tablet 2   No current facility-administered medications for this encounter.     Allergies:   Multaq [dronedarone]; Benadryl [diphenhydramine hcl]; Fish oil; Influenza vaccines; Latex; Oxycodone; Tape; and Sulfa antibiotics   Social History:  The patient  reports that she quit smoking about 19 years ago. Her smoking use included cigarettes. She has a 32.00 pack-year smoking history. She has never used smokeless tobacco. She reports that she drinks alcohol. She reports that she has current or past drug history. Drug: Marijuana.   Family History:  The patient's family history includes Breast cancer in her maternal aunt; Cardiomyopathy in her brother and father; Coronary artery disease in her maternal grandmother; Heart attack in her maternal grandfather and maternal grandmother; Heart disease in her father; Heart failure in her unknown relative; Hyperlipidemia in her father and unknown relative; Hypertension in her father and unknown relative.   ROS:  Please see the history of present illness.   Otherwise, review of systems is positive for none.   All other systems are reviewed and negative.   PHYSICAL EXAM: VS:  BP (!) 142/84   Pulse 72   Ht 5\' 5"  (1.651 m)   Wt 279 lb (126.6 kg)   BMI 46.43 kg/m  , BMI Body mass index is 46.43 kg/m. GEN: Well nourished, well developed, in no acute distress  HEENT: normal  Neck: no JVD, carotid bruits, or masses Cardiac: RRR; no murmurs, rubs, or gallops,no edema  Respiratory:  clear to auscultation bilaterally, normal work of breathing GI: soft, nontender, nondistended, + BS MS: no deformity  or atrophy  Skin: warm and dry Neuro:  Strength and sensation are intact Psych: euthymic mood, full affect  EKG:  EKG is ordered today. Personal review of the ekg ordered  shows sinus rhythm, rate 72   Recent Labs: 01/08/2018: BUN 11; Creatinine, Ser 0.70; Hemoglobin 13.2; Platelets 297; Potassium 4.1; Sodium 140    Lipid Panel     Component Value Date/Time   CHOL 198 05/21/2016 1350   TRIG 212 (H) 05/21/2016 1350   HDL 32 (L) 05/21/2016 1350   CHOLHDL 5.6 (H) 09/27/2015 0933   VLDL 37 (H) 09/27/2015 0933   LDLCALC 124 (H) 05/21/2016 1350     Wt Readings from Last 3 Encounters:  02/13/18 279 lb (126.6 kg)  01/18/18 275 lb 14.4  oz (125.1 kg)  12/31/17 282 lb 3.2 oz (128 kg)      Other studies Reviewed: Additional studies/ records that were reviewed today include: TTE 12/13/17  Review of the above records today demonstrates:  - Left ventricle: The cavity size was normal. Systolic function was   normal. The estimated ejection fraction was in the range of 50%   to 55%. Images were inadequate for LV wall motion assessment.   Left ventricular diastolic function parameters were normal. - Tricuspid valve: There was trivial regurgitation. - Recommendations: Suggest limited study with defininty contrast to   accurately assess EF and wall motion as endocardial segments are   not well visualized.  - Left atrium:  The atrium was normal in size.  ASSESSMENT AND PLAN:  1.  Paroxysmal atrial fibrillation: On Eliquis. S/p AF ablation 01/17/18.  She has done well with only one further episode of atrial fibrillation that she notes.  She feels that she was out of rhythm 8 days after her procedure.  She is in normal rhythm today.  We will make no further changes.  This patients CHA2DS2-VASc Score and unadjusted Ischemic Stroke Rate (% per year) is equal to 0.6 % stroke rate/year from a score of 1  Above score calculated as 1 point each if present [CHF, HTN, DM, Vascular=MI/PAD/Aortic  Plaque, Age if 65-74, or Female] Above score calculated as 2 points each if present [Age > 75, or Stroke/TIA/TE]  2.  OSA: CPAP compliance encouraged   Current medicines are reviewed at length with the patient today.   The patient does not have concerns regarding her medicines.  The following changes were made today: None  Labs/ tests ordered today include:  No orders of the defined types were placed in this encounter.  Case discussed with primary cardiology  Disposition:   FU with Will Camnitz 2 months  Signed, Will Meredith Leeds, MD  02/13/2018 8:37 AM     [facility] [address] [place] [address] [phone] (office) [phone] (fax)

## 2018-04-15 ENCOUNTER — Encounter: Payer: Self-pay | Admitting: Cardiology

## 2018-04-15 ENCOUNTER — Ambulatory Visit: Payer: PRIVATE HEALTH INSURANCE | Admitting: Cardiology

## 2018-04-15 VITALS — BP 120/72 | HR 69 | Ht 65.0 in | Wt 276.0 lb

## 2018-04-15 DIAGNOSIS — I480 Paroxysmal atrial fibrillation: Secondary | ICD-10-CM

## 2018-04-15 DIAGNOSIS — G4733 Obstructive sleep apnea (adult) (pediatric): Secondary | ICD-10-CM | POA: Diagnosis not present

## 2018-04-15 NOTE — Addendum Note (Signed)
Addended by: Stanton Kidney on: 04/15/2018 08:54 AM   Modules accepted: Orders

## 2018-04-15 NOTE — Progress Notes (Signed)
Electrophysiology Office Note   Date:  04/15/2018   ID:  Desiree Schultz, DOB Jan 17, 1971, MRN [id number]  PCP:  Dian Queen, MD  Cardiologist:  Gaynelle Cage Electrophysiologist: Barnetta Hammersmith Meredith Leeds, MD    No chief complaint on file.    History of Present Illness: Desiree Schultz is a 47 y.o. female who is being seen today for the evaluation of atrial fibrillaiton at the request of Roderic Palau. Presenting today for electrophysiology evaluation.  She has a history of paroxysmal atrial fibrillation that is been controlled on Rythmol.  She is concerned about the cost of the medication.   she has been on Xarelto in the past but due to low stroke risk has been taken off.  She exercises on a regular basis tendons recently lost 30+ pounds. She had an AF ablation 01/17/18.    Today, denies symptoms of palpitations, chest pain, shortness of breath, orthopnea, PND, lower extremity edema, claudication, dizziness, presyncope, syncope, bleeding, or neurologic sequela. The patient is tolerating medications without difficulties.  Overall she is doing well.  She has noted no further episodes of atrial fibrillation.  She wishes to come off her Eliquis at this time.  She has been exercising and doing daily activities.  She has found out that she has a milk allergy and she feels much improved since stopping dairy products.  Past Medical History:  Diagnosis Date  . Allergic rhinitis   . Anemia   . Atrial fibrillation ([place])    2006  . Bronchial breathing   . Chronic anxiety   . Depression   . Diabetes mellitus without complication ([place])   . Dysrhythmia   . Elevated blood pressure    On therapy. Probably hypertension  . Hx of cardiovascular stress test    a. Myoview 10/13:  no ischemia; EF 57%  . Hypothyroid   . Menorrhagia    2011  . Obesity (BMI 30-39.9)   . OSA (obstructive sleep apnea)    on CPAP  . PCOS (polycystic ovarian syndrome)   . PONV (postoperative nausea and vomiting)      C/o motion sickness  . Prediabetes   . Vitamin D deficiency    Past Surgical History:  Procedure Laterality Date  . ATRIAL FIBRILLATION ABLATION N/A 01/17/2018   Procedure: ATRIAL FIBRILLATION ABLATION;  Surgeon: Constance Haw, MD;  Location: [facility];  Service: Cardiovascular;  Laterality: N/A;  . BLADDER SURGERY  1997   interstitial cystitis   . BREAST REDUCTION SURGERY  06/2013  . BREAST SURGERY     reduction  . DILATION AND CURETTAGE, DIAGNOSTIC / THERAPEUTIC    . ENDOMETRIAL ABLATION    . ENDOMETRIAL ABLATION  08/2010  . HAND SURGERY  2006   due to cat bite  . KNEE ARTHROSCOPY WITH MEDIAL MENISECTOMY Left 04/06/2015   Procedure: LEFT KNEE ARTHROSCOPY WITH PARTIAL MEDIAL MENISCECTOMY;  Surgeon: Leandrew Koyanagi, MD;  Location: [facility];  Service: Orthopedics;  Laterality: Left;  . OVARIAN CYST REMOVAL  04/28/13  . REDUCTION MAMMAPLASTY Bilateral 2014  . RENAL BIOPSY, OPEN  2006  . TONSILLECTOMY  2007     Current Outpatient Medications  Medication Sig Dispense Refill  . cetirizine (ZYRTEC) 10 MG tablet Take 1 tablet (10 mg total) by mouth daily. 30 tablet 11  . ELIQUIS 5 MG TABS tablet TAKE 1 TABLET BY MOUTH TWICE DAILY 60 tablet 3  . escitalopram (LEXAPRO) 20 MG tablet Take 1 tablet (20 mg total) by mouth daily.  30 tablet 11  . levothyroxine (SYNTHROID) 150 MCG tablet Take 1 tablet (150 mcg total) by mouth daily before breakfast. 30 tablet 2  . metFORMIN (GLUCOPHAGE-XR) 500 MG 24 hr tablet Take 4 tablets by mouth daily.    . mometasone (NASONEX) 50 MCG/ACT nasal spray Place 2 sprays into the nose daily as needed (allergies).     . Multiple Vitamin (MULTIVITAMIN) tablet Take 1 tablet by mouth 3 (three) times daily.    . propafenone (RYTHMOL SR) 225 MG 12 hr capsule Take 225 mg by mouth 2 (two) times daily.    Marland Kitchen spironolactone (ALDACTONE) 50 MG tablet Take 1 tablet by mouth daily.     No current facility-administered medications for this visit.     Allergies:    Multaq [dronedarone]; Benadryl [diphenhydramine hcl]; Fish oil; Influenza vaccines; Latex; Oxycodone; Tape; and Sulfa antibiotics   Social History:  The patient  reports that she quit smoking about 19 years ago. Her smoking use included cigarettes. She has a 32.00 pack-year smoking history. She has never used smokeless tobacco. She reports that she drinks alcohol. She reports that she has current or past drug history. Drug: Marijuana.   Family History:  The patient's family history includes Breast cancer in her maternal aunt; Cardiomyopathy in her brother and father; Coronary artery disease in her maternal grandmother; Heart attack in her maternal grandfather and maternal grandmother; Heart disease in her father; Heart failure in her unknown relative; Hyperlipidemia in her father and unknown relative; Hypertension in her father and unknown relative.   ROS:  Please see the history of present illness.   Otherwise, review of systems is positive for none.   All other systems are reviewed and negative.   PHYSICAL EXAM: VS:  BP 120/72   Pulse 69   Ht 5\' 5"  (1.651 m)   Wt 276 lb (125.2 kg)   SpO2 96%   BMI 45.93 kg/m  , BMI Body mass index is 45.93 kg/m. GEN: Well nourished, well developed, in no acute distress  HEENT: normal  Neck: no JVD, carotid bruits, or masses Cardiac: RRR; no murmurs, rubs, or gallops,no edema  Respiratory:  clear to auscultation bilaterally, normal work of breathing GI: soft, nontender, nondistended, + BS MS: no deformity or atrophy  Skin: warm and dry Neuro:  Strength and sensation are intact Psych: euthymic mood, full affect  EKG:  EKG is ordered today. Personal review of the ekg ordered shows sinus rhythm, low voltage, rate 69  Recent Labs: 01/08/2018: BUN 11; Creatinine, Ser 0.70; Hemoglobin 13.2; Platelets 297; Potassium 4.1; Sodium 140    Lipid Panel     Component Value Date/Time   CHOL 198 05/21/2016 1350   TRIG 212 (H) 05/21/2016 1350   HDL 32 (L)  05/21/2016 1350   CHOLHDL 5.6 (H) 09/27/2015 0933   VLDL 37 (H) 09/27/2015 0933   LDLCALC 124 (H) 05/21/2016 1350     Wt Readings from Last 3 Encounters:  04/15/18 276 lb (125.2 kg)  02/13/18 279 lb (126.6 kg)  01/18/18 275 lb 14.4 oz (125.1 kg)      Other studies Reviewed: Additional studies/ records that were reviewed today include: TTE 12/13/17  Review of the above records today demonstrates:  - Left ventricle: The cavity size was normal. Systolic function was   normal. The estimated ejection fraction was in the range of 50%   to 55%. Images were inadequate for LV wall motion assessment.   Left ventricular diastolic function parameters were normal. - Tricuspid valve:  There was trivial regurgitation. - Recommendations: Suggest limited study with defininty contrast to   accurately assess EF and wall motion as endocardial segments are   not well visualized.  - Left atrium:  The atrium was normal in size.  ASSESSMENT AND PLAN:  1.  Paroxysmal atrial fibrillation: On Eliquis. Status post atrial fibrillation ablation 01/17/2018.  Due to her low stroke risk, we Cheston Coury plan to stop her Eliquis today.  She remains on per path unknown and has had minimal atrial fibrillation since ablation.   This patients CHA2DS2-VASc Score and unadjusted Ischemic Stroke Rate (% per year) is equal to 0.6 % stroke rate/year from a score of 1  Above score calculated as 1 point each if present [CHF, HTN, DM, Vascular=MI/PAD/Aortic Plaque, Age if 65-74, or Female] Above score calculated as 2 points each if present [Age > 75, or Stroke/TIA/TE]  2.  OSA: CPAP compliance encouraged  Current medicines are reviewed at length with the patient today.   The patient does not have concerns regarding her medicines.  The following changes were made today: Stop Eliquis  Labs/ tests ordered today include:  Orders Placed This Encounter  Procedures  . EKG 12-Lead    Disposition:   FU with Kellan Boehlke 3  months  Signed, Felicia Bloomquist Meredith Leeds, MD  04/15/2018 8:46 AM     [facility] [address] [place] [address] [phone] (office) [phone] (fax)

## 2018-04-15 NOTE — Patient Instructions (Addendum)
Medication Instructions:  Your physician has recommended you make the following change in your medication:  1. STOP Eliquis  * If you need a refill on your cardiac medications before your next appointment, please call your pharmacy.   Labwork: None ordered  Testing/Procedures: None ordered  Follow-Up: Your physician recommends that you schedule a follow-up appointment in: 3 months with Dr. Curt Bears.  *Please note that any paperwork needing to be filled out by the provider will need to be addressed at the front desk prior to seeing the provider. Please note that any FMLA, disability or other documents regarding health condition is subject to a $25.00 charge that must be received prior to completion of paperwork in the form of a money order or check.  Thank you for choosing [facility]!!   Trinidad Curet, RN [phone]  Any Other Special Instructions Will Be Listed Below (If Applicable).

## 2018-05-19 ENCOUNTER — Telehealth: Payer: Self-pay | Admitting: Cardiology

## 2018-05-19 DIAGNOSIS — I480 Paroxysmal atrial fibrillation: Secondary | ICD-10-CM

## 2018-05-19 DIAGNOSIS — R002 Palpitations: Secondary | ICD-10-CM

## 2018-05-19 NOTE — Telephone Encounter (Signed)
New message   Pt c/o of Chest Pain: STAT if CP now or developed within 24 hours  1. Are you having CP right now?no   2. Are you experiencing any other symptoms (ex. SOB, nausea, vomiting, sweating)? Nausea   3. How long have you been experiencing CP? 2 to 3 weeks   4. Is your CP continuous or coming and going? Comes and goes, dull pain  5. Have you taken Nitroglycerin? No

## 2018-05-19 NOTE — Telephone Encounter (Signed)
Patient is in the process of moving.  She has been doing this for the past 2 weekends. Weekend before last: She had a lot of friends come to help, 7/8.  They were in the attic that day.  Pt reports being exhausted after this, even though her friends did most of the work.  That night she felt like she was going to "puke".  She questioned dehydration.  One of her friends helping is a DO, they recommended some natural dissolvable tablets to help.  Pt believes this was probably some kind of electrolyte tablets. She reports noticing dark circles under her eyes - this is new to her. States that her "fatigue is extreme".  This past weekend she was moving some items out of storage unit.  About 1pm she started feeling sick on her stomach again.  She reports that while moving things she did experience  "like a chest tightness". During these reported issues/concerns pt does not believe it was r/t AFib. Pt aware I will discuss w/ Dr. Curt Bears and let her know recommendation/s.

## 2018-05-19 NOTE — Telephone Encounter (Addendum)
Discussed w/ Dr. Curt Bears. Pt further reports irregular rhythm occasionally. Especially with the heat. She reports that it is not AFib.  Pt aware I will re-discuss options w/ Dr. Curt Bears and call her back this week w/ advisement.  (if we order another monitor - pt has sensitive skin)

## 2018-05-22 NOTE — Telephone Encounter (Signed)
Dr. Curt Bears -  Don't think they occur daily.  Would you be agreeable to 2 week Zio?

## 2018-05-22 NOTE — Telephone Encounter (Signed)
Dr. Curt Bears - please read previous note where pt reports irregular rhythm occasionally "that is not AFib".  Pt interested in a monitor to determine what it is.  Ok to order monitor for further determination of her irregular heart rhythm?

## 2018-05-22 NOTE — Telephone Encounter (Signed)
OK for monitor. Would start with 48 hour if symptoms occur daily.

## 2018-05-23 NOTE — Telephone Encounter (Signed)
Plan for 30 day monitor.

## 2018-05-26 NOTE — Telephone Encounter (Signed)
Advised pt Dr. Curt Bears recommends a 30 day monitor. Pt agreeable to plan. She understands office will call to arrange monitor

## 2018-05-26 NOTE — Telephone Encounter (Signed)
Follow up  ° ° °Patient is returning your call. °

## 2018-05-30 IMAGING — MG 2D DIGITAL DIAGNOSTIC UNILATERAL RIGHT MAMMOGRAM WITH CAD AND AD
6 series · 6 of 14 positions shown · non-contrast
Comparison: Previous exam(s).

CLINICAL DATA: Screening recall for possible mass in the right
breast.

EXAM:
2D DIGITAL DIAGNOSTIC RIGHT MAMMOGRAM WITH CAD AND ADJUNCT TOMO
ULTRASOUND RIGHT BREAST

[R CC]
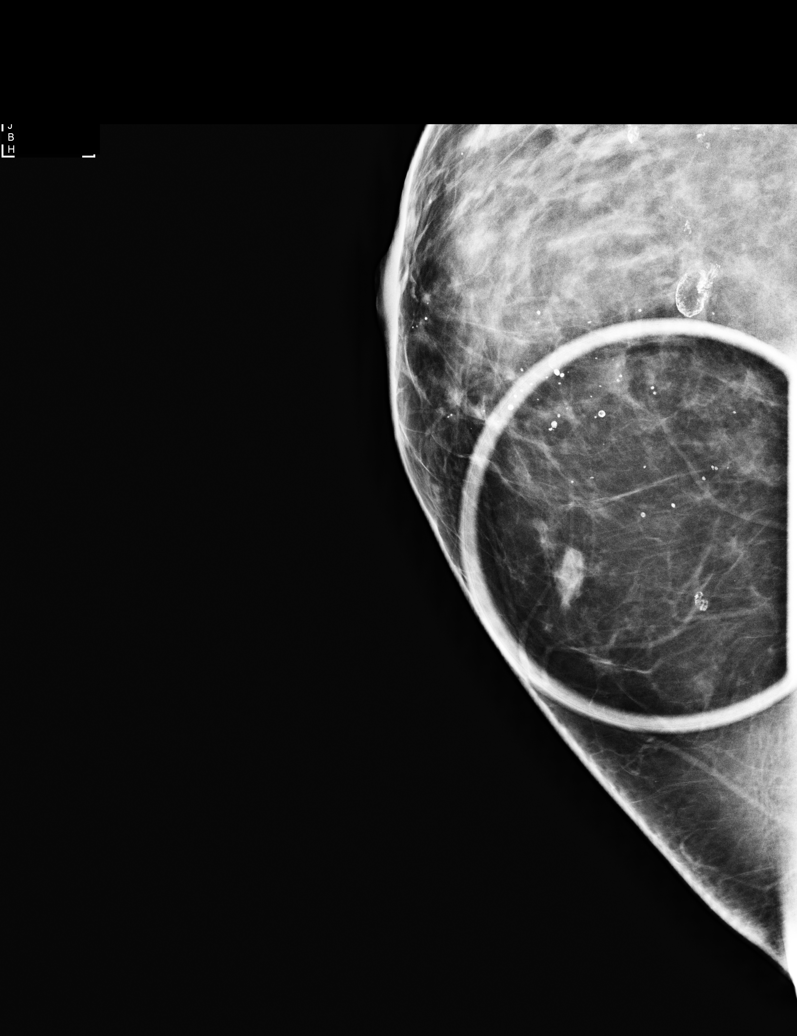

[R CC synth-2D]
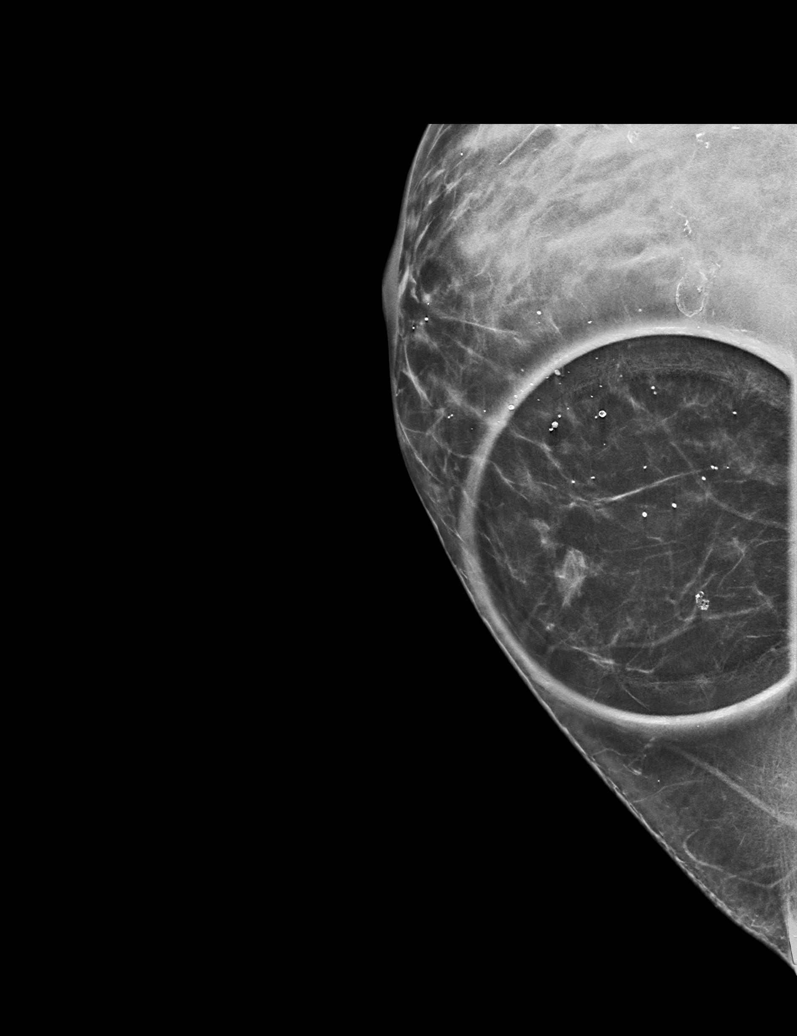

[R MLO synth-2D]
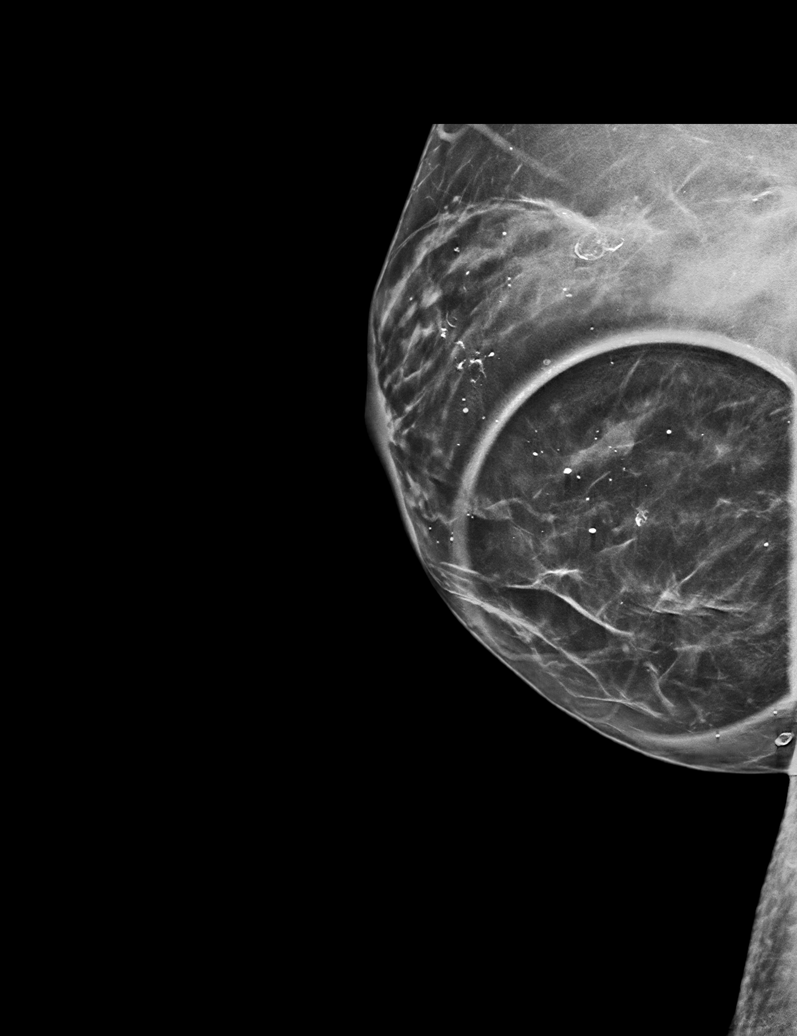

[R MLO]
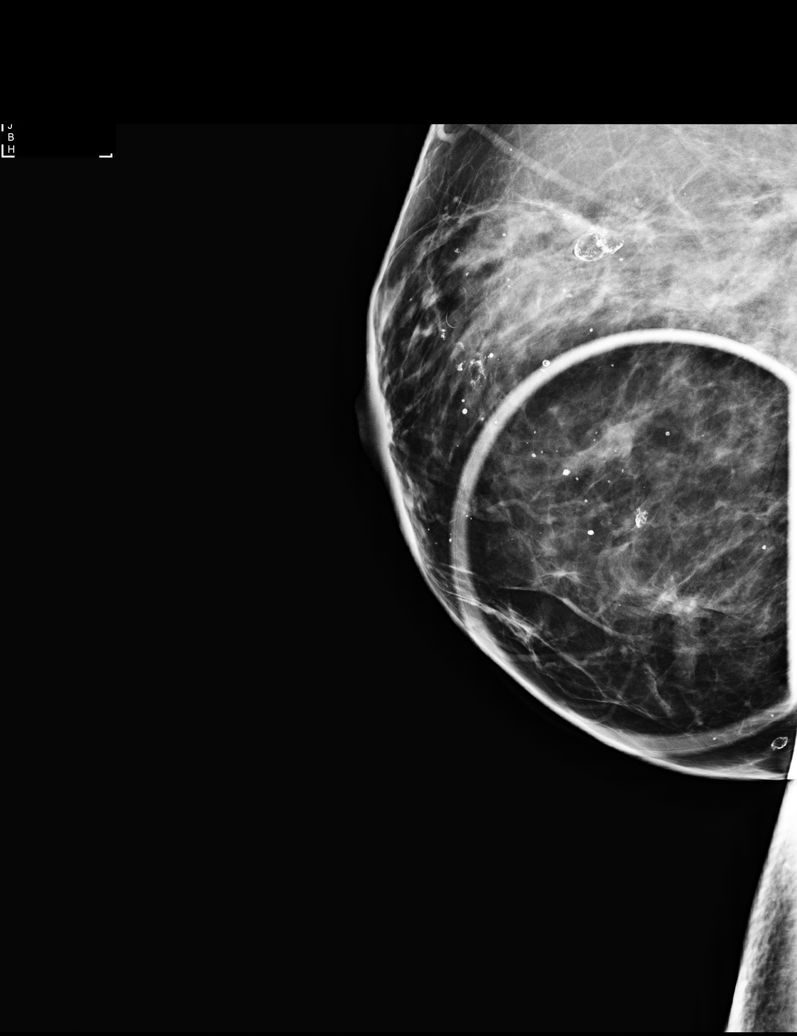

[R CC tomo · tomo slice 30/59.0]
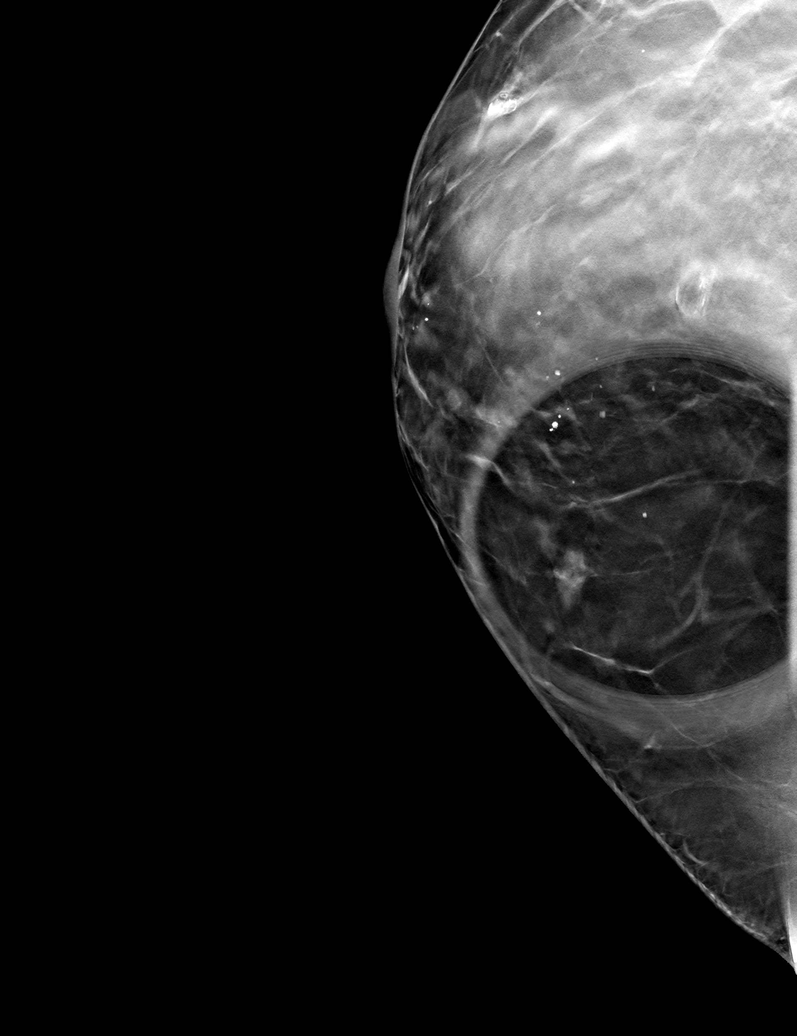

[R MLO tomo · tomo slice 39/77.0]
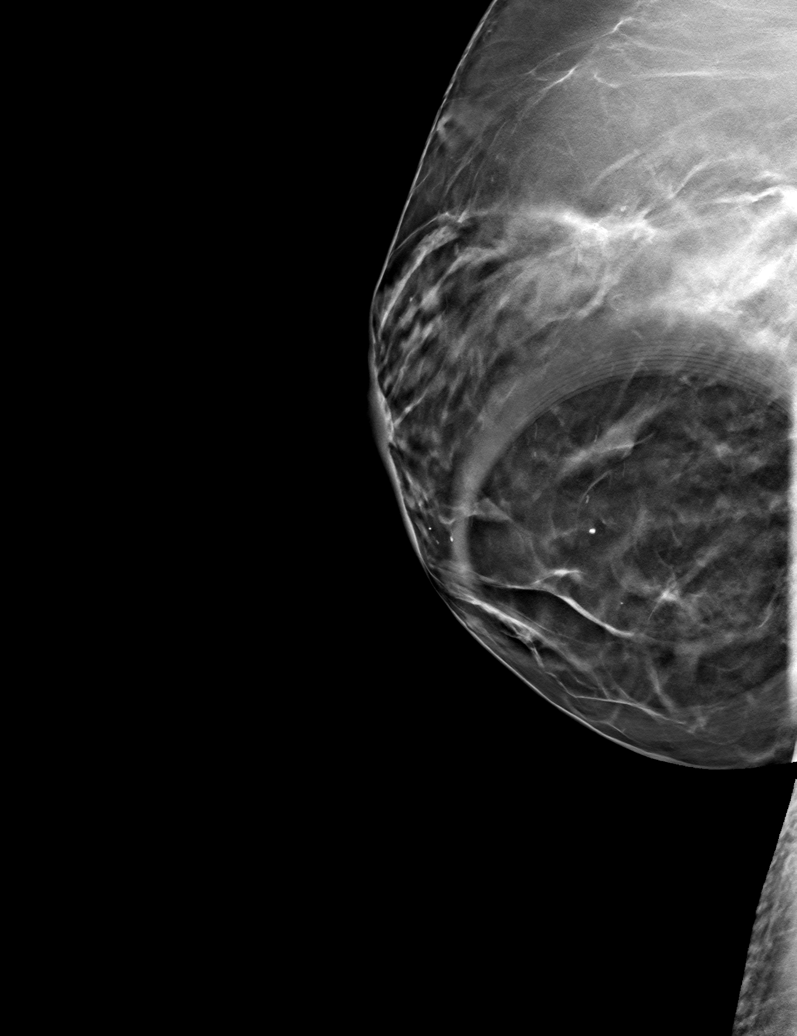

[6 of 14 positions shown; findings below may reference images not displayed]

ACR Breast Density Category b: There are scattered areas of
fibroglandular density.
FINDINGS: The possible mass in the lower inner aspect of the right breast
persists on the diagnostic images, best seen on the MLO image as an
oval circumscribed mass in the lower inner quadrant.

There are no other suspicious masses, no areas of architectural
distortion and no suspicious or new breast calcifications.

Mammographic images were processed with CAD.

On physical exam, no mass is palpated in the inner right breast.

Targeted ultrasound is performed, showing several small cysts. In
the 3 o'clock position, 2 cm the nipple, there is an oval cyst
measuring 7 x 3 x 5 mm, consistent in size, shape and location to
the mammographic finding. There are no solid masses or suspicious
lesions.
IMPRESSION: 1. No evidence of breast malignancy.
2. Small benign right breast cysts.

RECOMMENDATION:
Screening mammogram in one year.(Code:[id number]-[id number])

I have discussed the findings and recommendations with the patient.
Results were also provided in writing at the conclusion of the
visit. If applicable, a reminder letter will be sent to the patient
regarding the next appointment.

BI-RADS CATEGORY  2: Benign.

## 2018-06-11 ENCOUNTER — Ambulatory Visit (INDEPENDENT_AMBULATORY_CARE_PROVIDER_SITE_OTHER): Payer: PRIVATE HEALTH INSURANCE

## 2018-06-11 DIAGNOSIS — R002 Palpitations: Secondary | ICD-10-CM | POA: Diagnosis not present

## 2018-07-02 ENCOUNTER — Other Ambulatory Visit: Payer: Self-pay | Admitting: Internal Medicine

## 2018-07-02 DIAGNOSIS — Z1231 Encounter for screening mammogram for malignant neoplasm of breast: Secondary | ICD-10-CM

## 2018-07-08 ENCOUNTER — Telehealth: Payer: Self-pay | Admitting: Cardiology

## 2018-07-08 NOTE — Telephone Encounter (Signed)
Patient needed one more patch for monitor.  EOS date is 07/12/18.   Will leave a patch at check in desk. Patient will pick up 07/09/18 before 6pm.

## 2018-07-08 NOTE — Telephone Encounter (Signed)
New Message:   Patient need some one to call her concerning something she needs for her monitor.

## 2018-07-15 ENCOUNTER — Other Ambulatory Visit: Payer: Self-pay | Admitting: Cardiology

## 2018-07-15 ENCOUNTER — Encounter: Payer: Self-pay | Admitting: Cardiology

## 2018-07-15 MED ORDER — PROPAFENONE HCL ER 225 MG PO CP12
225.0000 mg | ORAL_CAPSULE | Freq: Two times a day (BID) | ORAL | 2 refills | Status: DC
Start: 2018-07-15 — End: 2019-11-23

## 2018-07-24 ENCOUNTER — Ambulatory Visit
Admission: RE | Admit: 2018-07-24 | Discharge: 2018-07-24 | Disposition: A | Discharge status: Home / Self Care | Payer: PRIVATE HEALTH INSURANCE | Source: Ambulatory Visit | Attending: Internal Medicine | Admitting: Internal Medicine

## 2018-07-24 DIAGNOSIS — Z1231 Encounter for screening mammogram for malignant neoplasm of breast: Secondary | ICD-10-CM | POA: Insufficient documentation

## 2018-08-04 NOTE — Progress Notes (Signed)
Electrophysiology Office Note   Date:  08/05/2018   ID:  Desiree Schultz, DOB [birth year], MRN [id number]  PCP:  Dian Queen, MD  Cardiologist:  Gaynelle Cage Electrophysiologist: Barnetta Hammersmith Meredith Leeds, MD    No chief complaint on file.    History of Present Illness: Desiree Schultz is a 47 y.o. female who is being seen today for the evaluation of atrial fibrillaiton at the request of Roderic Palau. Presenting today for electrophysiology evaluation.  She has a history of paroxysmal atrial fibrillation that is been controlled on Rythmol.  She is concerned about the cost of the medication.   she has been on Xarelto in the past but due to low stroke risk has been taken off.  She exercises on a regular basis tendons recently lost 30+ pounds. She had an AF ablation 01/17/18.    Today, denies symptoms of palpitations, chest pain, shortness of breath, orthopnea, PND, lower extremity edema, claudication, dizziness, presyncope, syncope, bleeding, or neurologic sequela. The patient is tolerating medications without difficulties.  Overall she is doing well.  She has no chest pain or shortness of breath.  She is able to do all of her daily activities.  She was having some chest tightness when she was moving, picking up boxes.  She has since exerted herself with no chest tightness noted.  Her CT scan did show a calcium score of 0.  Past Medical History:  Diagnosis Date  . Allergic rhinitis   . Anemia   . Atrial fibrillation ([place])    2006  . Bronchial breathing   . Chronic anxiety   . Depression   . Diabetes mellitus without complication ([place])   . Dysrhythmia   . Elevated blood pressure    On therapy. Probably hypertension  . Hx of cardiovascular stress test    a. Myoview 10/13:  no ischemia; EF 57%  . Hypothyroid   . Menorrhagia    2011  . Obesity (BMI 30-39.9)   . OSA (obstructive sleep apnea)    on CPAP  . PCOS (polycystic ovarian syndrome)   . PONV (postoperative nausea and  vomiting)    C/o motion sickness  . Prediabetes   . Vitamin D deficiency    Past Surgical History:  Procedure Laterality Date  . ATRIAL FIBRILLATION ABLATION N/A 01/17/2018   Procedure: ATRIAL FIBRILLATION ABLATION;  Surgeon: Constance Haw, MD;  Location: [facility];  Service: Cardiovascular;  Laterality: N/A;  . BLADDER SURGERY  1997   interstitial cystitis   . BREAST REDUCTION SURGERY  06/2013  . BREAST SURGERY     reduction  . DILATION AND CURETTAGE, DIAGNOSTIC / THERAPEUTIC    . ENDOMETRIAL ABLATION    . ENDOMETRIAL ABLATION  08/2010  . HAND SURGERY  2006   due to cat bite  . KNEE ARTHROSCOPY WITH MEDIAL MENISECTOMY Left 04/06/2015   Procedure: LEFT KNEE ARTHROSCOPY WITH PARTIAL MEDIAL MENISCECTOMY;  Surgeon: Leandrew Koyanagi, MD;  Location: [facility];  Service: Orthopedics;  Laterality: Left;  . OVARIAN CYST REMOVAL  04/28/13  . REDUCTION MAMMAPLASTY Bilateral 2014  . RENAL BIOPSY, OPEN  2006  . TONSILLECTOMY  2007     Current Outpatient Medications  Medication Sig Dispense Refill  . cetirizine (ZYRTEC) 10 MG tablet Take 1 tablet (10 mg total) by mouth daily. 30 tablet 11  . escitalopram (LEXAPRO) 20 MG tablet Take 1 tablet (20 mg total) by mouth daily. 30 tablet 11  . levothyroxine (SYNTHROID) 150 MCG tablet Take  1 tablet (150 mcg total) by mouth daily before breakfast. 30 tablet 2  . metFORMIN (GLUCOPHAGE-XR) 500 MG 24 hr tablet Take 4 tablets by mouth daily.    . mometasone (NASONEX) 50 MCG/ACT nasal spray Place 2 sprays into the nose daily as needed (allergies).     . Multiple Vitamin (MULTIVITAMIN) tablet Take 1 tablet by mouth 3 (three) times daily.    . propafenone (RYTHMOL SR) 225 MG 12 hr capsule Take 1 capsule (225 mg total) by mouth 2 (two) times daily. 180 capsule 2  . spironolactone (ALDACTONE) 50 MG tablet Take 1 tablet by mouth daily.     No current facility-administered medications for this visit.     Allergies:   Multaq [dronedarone]; Benadryl  [diphenhydramine hcl]; Fish oil; Influenza vaccines; Latex; Oxycodone; Tape; and Sulfa antibiotics   Social History:  The patient  reports that she quit smoking about 19 years ago. Her smoking use included cigarettes. She has a 32.00 pack-year smoking history. She has never used smokeless tobacco. She reports that she drinks alcohol. She reports that she has current or past drug history. Drug: Marijuana.   Family History:  The patient's family history includes Breast cancer in her other; Cardiomyopathy in her brother and father; Coronary artery disease in her maternal grandmother; Heart attack in her maternal grandfather and maternal grandmother; Heart disease in her father; Heart failure in her unknown relative; Hyperlipidemia in her father and unknown relative; Hypertension in her father and unknown relative.   ROS:  Please see the history of present illness.   Otherwise, review of systems is positive for cough.   All other systems are reviewed and negative.   PHYSICAL EXAM: VS:  BP 116/70   Pulse 73   Ht 5\' 5"  (1.651 m)   Wt 276 lb 12.8 oz (125.6 kg)   SpO2 97%   BMI 46.06 kg/m  , BMI Body mass index is 46.06 kg/m. GEN: Well nourished, well developed, in no acute distress  HEENT: normal  Neck: no JVD, carotid bruits, or masses Cardiac: RRR; no murmurs, rubs, or gallops,no edema  Respiratory:  clear to auscultation bilaterally, normal work of breathing GI: soft, nontender, nondistended, + BS MS: no deformity or atrophy  Skin: warm and dry Neuro:  Strength and sensation are intact Psych: euthymic mood, full affect  EKG:  EKG is not ordered today. Personal review of the ekg ordered 04/15/18 shows SR, rate 69, low voltage  Recent Labs: 01/08/2018: BUN 11; Creatinine, Ser 0.70; Hemoglobin 13.2; Platelets 297; Potassium 4.1; Sodium 140    Lipid Panel     Component Value Date/Time   CHOL 198 05/21/2016 1350   TRIG 212 (H) 05/21/2016 1350   HDL 32 (L) 05/21/2016 1350   CHOLHDL 5.6  (H) 09/27/2015 0933   VLDL 37 (H) 09/27/2015 0933   LDLCALC 124 (H) 05/21/2016 1350     Wt Readings from Last 3 Encounters:  08/05/18 276 lb 12.8 oz (125.6 kg)  04/15/18 276 lb (125.2 kg)  02/13/18 279 lb (126.6 kg)      Other studies Reviewed: Additional studies/ records that were reviewed today include: TTE 12/13/17  Review of the above records today demonstrates:  - Left ventricle: The cavity size was normal. Systolic function was   normal. The estimated ejection fraction was in the range of 50%   to 55%. Images were inadequate for LV wall motion assessment.   Left ventricular diastolic function parameters were normal. - Tricuspid valve: There was trivial regurgitation. -  Recommendations: Suggest limited study with defininty contrast to   accurately assess EF and wall motion as endocardial segments are   not well visualized.  - Left atrium:  The atrium was normal in size.  30 day monitor 07/15/18 - personally reviewed Sinus rhythm with sinus tachycardia. Symptoms of palpitations associated with sinus rhythm and rare PVCs No atrial fibrillation noted  ASSESSMENT AND PLAN:  1.  Paroxysmal atrial fibrillation: Currently not anticoagulated due to her low stroke risk.  Is status post AF ablation 01/17/2018.  She wore a 30-day monitor that showed no further episodes of atrial fibrillation.  We Shyann Hefner continue her path known for now but she may be able to come off of it after her 40-month prescription is up.    This patients CHA2DS2-VASc Score and unadjusted Ischemic Stroke Rate (% per year) is equal to 0.6 % stroke rate/year from a score of 1  Above score calculated as 1 point each if present [CHF, HTN, DM, Vascular=MI/PAD/Aortic Plaque, Age if 65-74, or Female] Above score calculated as 2 points each if present [Age > 75, or Stroke/TIA/TE]   2.  OSA: CPAP compliance encouraged  Current medicines are reviewed at length with the patient today.   The patient does not have concerns  regarding her medicines.  The following changes were made today: None  Labs/ tests ordered today include:  No orders of the defined types were placed in this encounter.   Disposition:   FU with Derrik Mceachern 6 months  Signed, Ryaan Vanwagoner Meredith Leeds, MD  08/05/2018 8:22 AM     [facility] [address] [place] [address] [phone] (office) [phone] (fax)

## 2018-08-05 ENCOUNTER — Encounter: Payer: Self-pay | Admitting: Cardiology

## 2018-08-05 ENCOUNTER — Ambulatory Visit: Payer: PRIVATE HEALTH INSURANCE | Admitting: Cardiology

## 2018-08-05 VITALS — BP 116/70 | HR 73 | Ht 65.0 in | Wt 276.8 lb

## 2018-08-05 DIAGNOSIS — G4733 Obstructive sleep apnea (adult) (pediatric): Secondary | ICD-10-CM | POA: Diagnosis not present

## 2018-08-05 DIAGNOSIS — I480 Paroxysmal atrial fibrillation: Secondary | ICD-10-CM | POA: Diagnosis not present

## 2018-08-05 NOTE — Patient Instructions (Signed)
Medication Instructions:  Your physician recommends that you continue on your current medications as directed. Please refer to the Current Medication list given to you today.  * If you need a refill on your cardiac medications before your next appointment, please call your pharmacy.   Labwork: None ordered  Testing/Procedures: None ordered  Follow-Up: Your physician wants you to follow-up in: 6 month with Dr. Curt Bears.  You will receive a reminder letter in the mail two months in advance. If you don't receive a letter, please call our office to schedule the follow-up appointment.   Thank you for choosing [facility]!!   Trinidad Curet, RN [phone]

## 2019-06-25 ENCOUNTER — Other Ambulatory Visit: Payer: Self-pay | Admitting: Physician Assistant

## 2019-06-25 DIAGNOSIS — Z1231 Encounter for screening mammogram for malignant neoplasm of breast: Secondary | ICD-10-CM

## 2019-07-28 ENCOUNTER — Ambulatory Visit
Admission: RE | Admit: 2019-07-28 | Discharge: 2019-07-28 | Disposition: A | Discharge status: Home / Self Care | Payer: PRIVATE HEALTH INSURANCE | Source: Ambulatory Visit | Attending: Physician Assistant | Admitting: Physician Assistant

## 2019-07-28 DIAGNOSIS — Z1231 Encounter for screening mammogram for malignant neoplasm of breast: Secondary | ICD-10-CM | POA: Diagnosis not present

## 2019-08-04 ENCOUNTER — Other Ambulatory Visit: Payer: Self-pay | Admitting: Physician Assistant

## 2019-08-04 DIAGNOSIS — R928 Other abnormal and inconclusive findings on diagnostic imaging of breast: Secondary | ICD-10-CM

## 2019-08-04 DIAGNOSIS — N6320 Unspecified lump in the left breast, unspecified quadrant: Secondary | ICD-10-CM

## 2019-08-07 ENCOUNTER — Ambulatory Visit
Admission: RE | Admit: 2019-08-07 | Discharge: 2019-08-07 | Disposition: A | Discharge status: Home / Self Care | Payer: PRIVATE HEALTH INSURANCE | Source: Ambulatory Visit | Attending: Physician Assistant | Admitting: Physician Assistant

## 2019-08-07 DIAGNOSIS — R928 Other abnormal and inconclusive findings on diagnostic imaging of breast: Secondary | ICD-10-CM | POA: Diagnosis present

## 2019-08-07 DIAGNOSIS — N6320 Unspecified lump in the left breast, unspecified quadrant: Secondary | ICD-10-CM | POA: Diagnosis present

## 2019-09-02 ENCOUNTER — Telehealth: Payer: Self-pay | Admitting: Pulmonary Disease

## 2019-09-02 NOTE — Telephone Encounter (Signed)
Called and spoke with pt and confirmed the MD that she was needing to have the sleep study sent to. Located the fax number and have faxed it to provider. Nothing further needed.

## 2019-09-21 ENCOUNTER — Encounter: Payer: Self-pay | Admitting: Gastroenterology

## 2019-10-20 ENCOUNTER — Encounter: Payer: Self-pay | Admitting: Emergency Medicine

## 2019-10-20 ENCOUNTER — Other Ambulatory Visit: Payer: Self-pay | Admitting: Emergency Medicine

## 2019-10-23 ENCOUNTER — Encounter: Payer: Self-pay | Admitting: Gastroenterology

## 2019-10-23 ENCOUNTER — Ambulatory Visit (INDEPENDENT_AMBULATORY_CARE_PROVIDER_SITE_OTHER): Payer: PRIVATE HEALTH INSURANCE | Admitting: Gastroenterology

## 2019-10-23 DIAGNOSIS — Z01818 Encounter for other preprocedural examination: Secondary | ICD-10-CM | POA: Diagnosis not present

## 2019-10-23 DIAGNOSIS — R198 Other specified symptoms and signs involving the digestive system and abdomen: Secondary | ICD-10-CM

## 2019-10-23 DIAGNOSIS — K5900 Constipation, unspecified: Secondary | ICD-10-CM | POA: Diagnosis not present

## 2019-10-23 MED ORDER — SUTAB 1479-225-188 MG PO TABS: 1.0000 | ORAL_TABLET | ORAL | 0 refills | Status: DC

## 2019-10-23 NOTE — Progress Notes (Signed)
TELEHEALTH VISIT  Referring Provider: Dian Queen, MD Primary Care Physician:  Dian Queen, MD   Tele-visit due to COVID-19 pandemic Patient requested visit virtually, consented to the virtual encounter via video enabled telemedicine application (Doximity) Contact made at: 10/23/19 08:30 Patient verified by name and date of birth Location of patient: Home Location provider: Home office Names of persons participating: Me, patient, Tinnie Gens CMA Time spent on telehealth visit: 28 minutes on the video visit I discussed the limitations of evaluation and management by telemedicine. The patient expressed understanding and agreed to proceed.  Reason for Consultation:  Colon cancer screening, constipation   IMPRESSION:  Chronic constipation with associated bloating    - did not tolerate Linzess    - notes that her recent TSH was essentially normal Sensation of fullness at the rectum Rectal skin tag, previously evaluated by Surgeon at the [facility] Need for colon cancer screening  Family history of colon polyps (Mom, Maternal aunt) No known family history of colon cancer  Chronic constipation with a sense of incomplete evacuation and sense of fullness at the rectum suggests the possibility of pelvic floor dysfunction.  She has had a lifetime of symptoms, but her recent low carb, high fiber diet has exacerbated her constipation. Recommend high fiber diet with adequate fluid intake.  Will obtain records from recent TSH testing. Proceed with colonoscopy. Proceed with evaluation for pelvic floor dysenergia if appropriate based on colonoscopy results.  She would like to know why she has constipation more than take medications. Is interested in considering natural treatments over medications.   History of skin tags. Will obtain records from surgical consultation.  Family history of colon polyps: Colonoscopy recommended now. Current guidelines suggest 5 year interval given her  family history.    PLAN: Consider 2 kiwi daily for control of symptoms Patient will bring a copy of her labs from [facility] documenting TSH Colonoscopy for colon cancer screening with two day prep Obtain records from the surgeron at the [facility]  Please see the "Patient Instructions" section for addition details about the plan.  HPI: Desiree Schultz is a 49 y.o. female referred by PA Carrie Mew for colon cancer screening.  She has atrial fibrillation not requiring anticoagulation s/p ablation 01/17/18, B12 deficiency, OSA on CPAP, hypothyroidism, depression, anxiety, obesity, and polycystic ovarian syndrome. Currently working with [employer] Weight loss and is on a low carb, adequate fiber diet. 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   Lifetime history of constipation with associated bloating, diffuse abdominal pain, sense of incomplete evacuation.  Feels like a knot is in her rectum, described as a bulge. No manual assistance with defecation now, but she has in the past. No blood or mucous.  Constipation has become more difficult on her current diet.    Unsatisfied with her current treatment approach that includes stool softeners daily, Metamucil 20 capsules daily, and Milk of Magnesia PRN. Having a bowel movement every 3 days without her regimen. Feels like a knot is in her rectum, described as a bulge. No manual assistance with defecation now, but she has in the past. No blood or mucous in the stool.  Constipation has become more difficult on her current diet.  Linzess in the past either didn't work or caused severe diarrhea.   Has a rectal skin tag. Evaluated by a surgeon who didn't recommend any intervention  Labs 08/24/19: calcium 8.2 Labs 09/23/18: hemoglobin 13, MCV 95, RDW 12, platelets 305  No prior colonoscopy or abdominal imaging.  Mother and maternal aunt with polyps. Both had polyps found on initial colonoscopy.  No other known family history of colon cancer or polyps. No family history  of uterine/endometrial cancer, pancreatic cancer or gastric/stomach cancer.   Past Medical History:  Diagnosis Date  . Allergic rhinitis   . Anemia   . Atrial fibrillation ([place])    2006  . Bronchial breathing   . Chronic anxiety   . Depression   . Diabetes mellitus without complication ([place])   . Dysrhythmia   . Elevated blood pressure    On therapy. Probably hypertension  . Fibroid   . Hx of cardiovascular stress test    a. Myoview 10/13:  no ischemia; EF 57%  . Hypothyroid   . Menorrhagia    2011  . Obesity (BMI 30-39.9)   . OSA (obstructive sleep apnea)    on CPAP  . PCOS (polycystic ovarian syndrome)   . PONV (postoperative nausea and vomiting)    C/o motion sickness  . Prediabetes   . Vitamin D deficiency     Past Surgical History:  Procedure Laterality Date  . ATRIAL FIBRILLATION ABLATION N/A 01/17/2018   Procedure: ATRIAL FIBRILLATION ABLATION;  Surgeon: Constance Haw, MD;  Location: [facility];  Service: Cardiovascular;  Laterality: N/A;  . BLADDER SURGERY  1997   interstitial cystitis   . BREAST REDUCTION SURGERY  06/2013  . BREAST SURGERY     reduction  . DILATION AND CURETTAGE, DIAGNOSTIC / THERAPEUTIC    . ENDOMETRIAL ABLATION    . ENDOMETRIAL ABLATION  08/2010  . HAND SURGERY  2006   due to cat bite  . KNEE ARTHROSCOPY WITH MEDIAL MENISECTOMY Left 04/06/2015   Procedure: LEFT KNEE ARTHROSCOPY WITH PARTIAL MEDIAL MENISCECTOMY;  Surgeon: Leandrew Koyanagi, MD;  Location: [facility];  Service: Orthopedics;  Laterality: Left;  . OVARIAN CYST REMOVAL  04/28/13  . REDUCTION MAMMAPLASTY Bilateral 2014  . RENAL BIOPSY, OPEN  2006  . TONSILLECTOMY  2007    Current Outpatient Medications  Medication Sig Dispense Refill  . ascorbic acid (VITAMIN C) 1000 MG tablet Take 1 tablet by mouth daily.    . Blood Glucose Monitoring Suppl (FIFTY50 GLUCOSE METER 2.0) w/Device KIT Test as needed    . celecoxib (CELEBREX) 200 MG capsule Take 1 capsule by mouth daily.      . cetirizine (ZYRTEC) 10 MG tablet Take 1 tablet (10 mg total) by mouth daily. 30 tablet 11  . Docusate Sodium (COLACE PO) Take by mouth.    . drospirenone-ethinyl estradiol (YASMIN 28) 3-0.03 MG tablet Take 1 tablet by mouth daily.    Marland Kitchen escitalopram (LEXAPRO) 20 MG tablet Take 1 tablet (20 mg total) by mouth daily. 30 tablet 11  . ipratropium (ATROVENT) 0.06 % nasal spray USE 2 SPRAYS IN BOTH  NOSTRILS 3 TIMES DAILY    . levothyroxine (SYNTHROID) 137 MCG tablet Take by mouth.    . levothyroxine (SYNTHROID) 150 MCG tablet Take 1 tablet (150 mcg total) by mouth daily before breakfast. 30 tablet 2  . liothyronine (CYTOMEL) 5 MCG tablet Take by mouth.    . metFORMIN (GLUCOPHAGE-XR) 500 MG 24 hr tablet Take 4 tablets by mouth daily.    . mometasone (NASONEX) 50 MCG/ACT nasal spray Place 2 sprays into the nose daily as needed (allergies).     . Multiple Vitamin (MULTIVITAMIN) tablet Take 1 tablet by mouth 3 (three) times daily.    . propafenone (RYTHMOL SR) 225 MG 12 hr capsule  Take 1 capsule (225 mg total) by mouth 2 (two) times daily. 180 capsule 2  . spironolactone (ALDACTONE) 50 MG tablet Take 1 tablet by mouth daily.     No current facility-administered medications for this visit.    Allergies as of 10/23/2019 - Review Complete 08/05/2018  Allergen Reaction Noted  . Multaq [dronedarone] Swelling 07/20/2016  . Benadryl [diphenhydramine hcl] Itching 02/20/2012  . Fish oil  10/25/2011  . Influenza vaccines  05/23/2012  . Latex  05/23/2012  . Oxycodone Nausea And Vomiting 01/13/2018  . Tape  05/23/2012  . Sulfa antibiotics Rash 05/21/2016    Family History  Problem Relation Age of Onset  . Cardiomyopathy Father   . Heart disease Father   . Hyperlipidemia Father   . Hypertension Father   . Cardiomyopathy Brother   . Hypertension Other        fathers side  . Hyperlipidemia Other        fathers side  . Heart failure Other        both grandparents - paternal  . Coronary artery  disease Maternal Grandmother   . Heart attack Maternal Grandmother        and other maternal family members  . Heart attack Maternal Grandfather   . Breast cancer Other     Social History   Socioeconomic History  . Marital status: Significant Other    Spouse name: Christell Faith  . Number of children: Not on file  . Years of education: associates in IT  . Highest education level: Not on file  Occupational History  . Occupation: Psychologist, educational: [employer]  Tobacco Use  . Smoking status: Former Smoker    Packs/day: 2.00    Years: 16.00    Pack years: 32.00    Types: Cigarettes    Quit date: 09/03/1998    Years since quitting: 21.1  . Smokeless tobacco: Never Used  Substance and Sexual Activity  . Alcohol use: Yes    Comment: maybe 2 a mth  . Drug use: Yes    Types: Marijuana    Comment: occasional  . Sexual activity: Yes  Other Topics Concern  . Not on file  Social History Narrative   Long term partner, Ortencia Kick from [place]   Has lived in [place] since age 80 yo.   Social Determinants of Health   Financial Resource Strain:   . Difficulty of Paying Living Expenses: Not on file  Food Insecurity:   . Worried About Charity fundraiser in the Last Year: Not on file  . Ran Out of Food in the Last Year: Not on file  Transportation Needs:   . Lack of Transportation (Medical): Not on file  . Lack of Transportation (Non-Medical): Not on file  Physical Activity:   . Days of Exercise per Week: Not on file  . Minutes of Exercise per Session: Not on file  Stress:   . Feeling of Stress : Not on file  Social Connections:   . Frequency of Communication with Friends and Family: Not on file  . Frequency of Social Gatherings with Friends and Family: Not on file  . Attends Religious Services: Not on file  . Active Member of Clubs or Organizations: Not on file  . Attends Archivist Meetings: Not on  file  . Marital Status: Not on file  Intimate Partner Violence:   . Fear of Current or Ex-Partner: Not on  file  . Emotionally Abused: Not on file  . Physically Abused: Not on file  . Sexually Abused: Not on file    Review of Systems: ALL ROS discussed and all others negative except listed in HPI.  Physical Exam: Complete physical exam not performed due to the limits inherent in a telehealth encounter.  General: Awake, alert, and oriented, and well communicative. In no acute distress.  HEENT: EOMI, non-icteric sclera, NCAT, MMM  Neck: Normal movement of head and neck  Pulm: No labored breathing, speaking in full sentences without conversational dyspnea  Derm: No apparent lesions or bruising in visible field  MS: Moves all visible extremities without noticeable abnormality  Psych: Pleasant, cooperative, normal speech, normal affect and normal insight Neuro: Alert and appropriate   Chandel Zaun L. Tarri Glenn, MD, MPH [facility] 10/23/2019, 8:22 AM

## 2019-10-23 NOTE — Patient Instructions (Addendum)
I have recommended a colonoscopy. I would like to see your recent TSH results from [facility] MD.   I have not recommended any changes to your medicines today and heard you say that you were prefer to use natural therapies to control your constipation. There is recent data that suggests 2 kiwi every day may improve constipation as much as medications. You may want to discuss using kiwi with your [facility] MD team.    Toileting tips to help with your constipation  - Establish a time to try to move your bowels every day. For many people, this is after a cup of coffee.  - Sit all of the way back on the toilet keeping your back fairly straight and lean forward bending from your hips, resting your forearms on your thighs. Raising your feet with a step stool can be helpful.  - Relax the rectum feeling it bulge toward the toilet water. If you feel your rectum raising toward your body, you are contracting rather than relaxing.  - Breathe in and slowly exhale. "Belly breath" by expanding your belly towards your belly button. Keep belly expanded as you gently direct pressure down and back to the anus. A low pitched GRRR sound can assist with increasing intra-abdominal pressure.   - Repeat 3-4 times. If unsuccessful, contract the pelvic floor to restore normal tone and get off the toilet. Avoid excessive straining.  - To reduce excessive wiping by teaching your anus to normally contract, place hands on outer aspect of knees and resist knee movement outward. Hold 5-10 second then place hands just inside of knees and resist inward movement of knees. Hold 5 seconds. Repeat a few times each way.

## 2019-11-18 ENCOUNTER — Telehealth: Payer: Self-pay | Admitting: Gastroenterology

## 2019-11-18 NOTE — Telephone Encounter (Signed)
Pt called to inform that copay for sutab tables are $166. Her pharmacy told her that the other prep that they have in stock is suprep, everything else is in back order. Pt does not want anything sent to her pharmacy until we speak with her first so please call her first.

## 2019-11-19 ENCOUNTER — Ambulatory Visit (INDEPENDENT_AMBULATORY_CARE_PROVIDER_SITE_OTHER): Payer: PRIVATE HEALTH INSURANCE

## 2019-11-19 ENCOUNTER — Other Ambulatory Visit: Payer: Self-pay | Admitting: Gastroenterology

## 2019-11-19 DIAGNOSIS — Z1159 Encounter for screening for other viral diseases: Secondary | ICD-10-CM

## 2019-11-19 LAB — SARS CORONAVIRUS 2 (TAT 6-24 HRS): SARS Coronavirus 2: NEGATIVE

## 2019-11-19 MED ORDER — SUTAB 1479-225-188 MG PO TABS: 1.0000 | ORAL_TABLET | ORAL | 0 refills | Status: DC

## 2019-11-19 NOTE — Telephone Encounter (Signed)
Patient states that her pharmacy never called her to tell her prescription was ready and when she arrived they told her the cost was $166. She states a coworker gave her a suprep coupon making it only $14 but she really prefers to take the pills and not drink the liquid prep. Sent in new prescription with sutub coupon and will see if it is any cheaper.

## 2019-11-19 NOTE — Telephone Encounter (Signed)
Pt stated that insurance will not cover Suprep nor Sutab but will cover Miralax.

## 2019-11-20 ENCOUNTER — Encounter: Payer: Self-pay | Admitting: Gastroenterology

## 2019-11-20 ENCOUNTER — Telehealth: Payer: Self-pay | Admitting: Gastroenterology

## 2019-11-20 MED ORDER — BISACODYL 5 MG PO TBEC: 5.0000 mg | DELAYED_RELEASE_TABLET | Freq: Every day | ORAL | 0 refills | Status: DC | PRN

## 2019-11-20 MED ORDER — POLYETHYLENE GLYCOL 3350 17 GM/SCOOP PO POWD: 1.0000 | Freq: Every day | ORAL | 3 refills | Status: DC

## 2019-11-20 NOTE — Telephone Encounter (Signed)
See other phone note

## 2019-11-20 NOTE — Telephone Encounter (Signed)
Spoke with patient and she is unsure of which prep she would like to use, I offered her Plenvu since we have samples but she states she would like to see if she can work with the pharmacy and get the [place] cheaper. If not she will come by the office and get the Plenvu.

## 2019-11-20 NOTE — Telephone Encounter (Signed)
Patient states she was able to get Sutab and I sent in prescriptions for Miralax and ducolax. Pt informed and verbalized understanding.

## 2019-11-20 NOTE — Telephone Encounter (Signed)
Noted thank you

## 2019-11-23 ENCOUNTER — Other Ambulatory Visit: Payer: Self-pay

## 2019-11-23 ENCOUNTER — Ambulatory Visit (AMBULATORY_SURGERY_CENTER): Payer: PRIVATE HEALTH INSURANCE | Admitting: Gastroenterology

## 2019-11-23 ENCOUNTER — Encounter: Payer: Self-pay | Admitting: Gastroenterology

## 2019-11-23 VITALS — BP 100/64 | HR 61 | Temp 96.2°F | Resp 12 | Ht 65.0 in | Wt 276.0 lb

## 2019-11-23 DIAGNOSIS — Z1211 Encounter for screening for malignant neoplasm of colon: Secondary | ICD-10-CM

## 2019-11-23 DIAGNOSIS — D128 Benign neoplasm of rectum: Secondary | ICD-10-CM

## 2019-11-23 DIAGNOSIS — Z800 Family history of malignant neoplasm of digestive organs: Secondary | ICD-10-CM

## 2019-11-23 DIAGNOSIS — K621 Rectal polyp: Secondary | ICD-10-CM | POA: Diagnosis not present

## 2019-11-23 DIAGNOSIS — R198 Other specified symptoms and signs involving the digestive system and abdomen: Secondary | ICD-10-CM

## 2019-11-23 MED ORDER — SODIUM CHLORIDE 0.9 % IV SOLN
500.0000 mL | Freq: Once | INTRAVENOUS | Status: DC
Start: 2019-11-23 — End: 2019-11-23

## 2019-11-23 NOTE — Progress Notes (Signed)
Called to room to assist during endoscopic procedure.  Patient ID and intended procedure confirmed with present staff. Received instructions for my participation in the procedure from the performing physician.  

## 2019-11-23 NOTE — Op Note (Signed)
[facility] Patient Name: Desiree Schultz Procedure Date: 11/23/2019 2:28 PM MRN: [id number] Endoscopist: Thornton Park MD, MD Age: 49 Referring MD:  Date of Birth: Dec 01, 1970 Gender: Female Account #: [id number] Procedure:                Colonoscopy Indications:              Screening for colorectal malignant neoplasm, This                            is the patient's first colonoscopy                           Family history of colon polyps (Mom, Maternal aunt)                           No known family history of colon cancer Medicines:                Monitored Anesthesia Care Procedure:                Pre-Anesthesia Assessment:                           - Prior to the procedure, a History and Physical                            was performed, and patient medications and                            allergies were reviewed. The patient's tolerance of                            previous anesthesia was also reviewed. The risks                            and benefits of the procedure and the sedation                            options and risks were discussed with the patient.                            All questions were answered, and informed consent                            was obtained. Prior Anticoagulants: The patient has                            taken no previous anticoagulant or antiplatelet                            agents. ASA Grade Assessment: II - A patient with                            mild systemic disease. After reviewing the risks  and benefits, the patient was deemed in                            satisfactory condition to undergo the procedure.                           After obtaining informed consent, the colonoscope                            was passed under direct vision. Throughout the                            procedure, the patient's blood pressure, pulse, and                            oxygen saturations were monitored  continuously. The                            Colonoscope was introduced through the anus and                            advanced to the 3 cm into the ileum. A second                            forward view of the right colon was performed. The                            colonoscopy was technically difficult and complex                            due to a redundant colon, significant looping and a                            tortuous colon. Successful completion of the                            procedure was aided by changing the patient's                            position and applying abdominal pressure. The                            patient tolerated the procedure well. The quality                            of the bowel preparation was good. The terminal                            ileum, ileocecal valve, appendiceal orifice, and                            rectum were photographed. Scope In: 2:51:01 PM Scope Out: 3:12:44 PM Scope Withdrawal Time: 0 hours 11 minutes 1 second  Total Procedure Duration: 0 hours 21 minutes 43 seconds  Findings:                 Skin tags were found on perianal exam.                           Non-bleeding external and internal hemorrhoids were                            found.                           Two flat polyps were found in the distal rectum.                            The polyps were 1 to 2 mm in size. These polyps                            were removed with a cold biopsy forceps. Resection                            and retrieval were complete. Estimated blood loss                            was minimal.                           Diffuse melanosis coli was present. The colon was                            tortuous and redundant. The exam was otherwise                            without abnormality on direct and retroflexion                            views. Complications:            No immediate complications. Estimated Blood Loss:      Estimated blood loss was minimal. Impression:               - Perianal skin tags found on perianal exam.                           - Non-bleeding external and internal hemorrhoids.                           - Two 1 to 2 mm polyps in the distal rectum,                            removed with a cold biopsy forceps. Resected and                            retrieved.                           -  The examination was otherwise normal on direct                            and retroflexion views. Recommendation:           - Patient has a contact number available for                            emergencies. The signs and symptoms of potential                            delayed complications were discussed with the                            patient. Return to normal activities tomorrow.                            Written discharge instructions were provided to the                            patient.                           - Resume previous diet.                           - Continue present medications.                           - Await pathology results.                           - Repeat colonoscopy in 5 years for surveillance                            given the family history. Thornton Park MD, MD 11/23/2019 3:18:58 PM This report has been signed electronically.

## 2019-11-23 NOTE — Progress Notes (Signed)
Temp by Ledora Bottcher by KA

## 2019-11-23 NOTE — Progress Notes (Signed)
Report given to PACU, vss 

## 2019-11-23 NOTE — Patient Instructions (Signed)
Information on polyps and hemorrhoids given to you today.  Await pathology results.  Repeat colonoscopy in 5 years.  YOU HAD AN ENDOSCOPIC PROCEDURE TODAY AT [facility]:   Refer to the procedure report that was given to you for any specific questions about what was found during the examination.  If the procedure report does not answer your questions, please call your gastroenterologist to clarify.  If you requested that your care partner not be given the details of your procedure findings, then the procedure report has been included in a sealed envelope for you to review at your convenience later.  YOU SHOULD EXPECT: Some feelings of bloating in the abdomen. Passage of more gas than usual.  Walking can help get rid of the air that was put into your GI tract during the procedure and reduce the bloating. If you had a lower endoscopy (such as a colonoscopy or flexible sigmoidoscopy) you may notice spotting of blood in your stool or on the toilet paper. If you underwent a bowel prep for your procedure, you may not have a normal bowel movement for a few days.  Please Note:  You might notice some irritation and congestion in your nose or some drainage.  This is from the oxygen used during your procedure.  There is no need for concern and it should clear up in a day or so.  SYMPTOMS TO REPORT IMMEDIATELY:   Following lower endoscopy (colonoscopy or flexible sigmoidoscopy):  Excessive amounts of blood in the stool  Significant tenderness or worsening of abdominal pains  Swelling of the abdomen that is new, acute  Fever of 100F or higher   For urgent or emergent issues, a gastroenterologist can be reached at any hour by calling [phone]. Do not use MyChart messaging for urgent concerns.    DIET:  We do recommend a small meal at first, but then you may proceed to your regular diet.  Drink plenty of fluids but you should avoid alcoholic beverages for 24 hours.  ACTIVITY:   You should plan to take it easy for the rest of today and you should NOT DRIVE or use heavy machinery until tomorrow (because of the sedation medicines used during the test).    FOLLOW UP: Our staff will call the number listed on your records 48-72 hours following your procedure to check on you and address any questions or concerns that you may have regarding the information given to you following your procedure. If we do not reach you, we will leave a message.  We will attempt to reach you two times.  During this call, we will ask if you have developed any symptoms of COVID 19. If you develop any symptoms (ie: fever, flu-like symptoms, shortness of breath, cough etc.) before then, please call [phone].  If you test positive for Covid 19 in the 2 weeks post procedure, please call and report this information to [place].    If any biopsies were taken you will be contacted by phone or by letter within the next 1-3 weeks.  Please call us at [phone] if you have not heard about the biopsies in 3 weeks.    SIGNATURES/CONFIDENTIALITY: You and/or your care partner have signed paperwork which will be entered into your electronic medical record.  These signatures attest to the fact that that the information above on your After Visit Summary has been reviewed and is understood.  Full responsibility of the confidentiality of this discharge information lies with you and/or  your care-partner. 

## 2019-11-24 ENCOUNTER — Telehealth: Payer: Self-pay | Admitting: *Deleted

## 2019-11-24 ENCOUNTER — Other Ambulatory Visit: Payer: Self-pay | Admitting: *Deleted

## 2019-11-24 DIAGNOSIS — R198 Other specified symptoms and signs involving the digestive system and abdomen: Secondary | ICD-10-CM

## 2019-11-24 NOTE — Telephone Encounter (Signed)
-----   Message from Thornton Park, MD sent at 11/23/2019  3:19 PM EDT ----- Please schedule anorectal manometry. Indication: rectal fullness, evaluation for pelvic floor dyssnergia. Thank you.

## 2019-11-24 NOTE — Telephone Encounter (Signed)
Noted! Thank you

## 2019-11-24 NOTE — Telephone Encounter (Signed)
Dr. Karin Lieu-  After speaking with the patient and scheduling the patient for her anorectal manometry, when I called the patient back to give her the date she stated, "Can we just do this later? Today is not a good day for me." I asked the patient if she wanted to reschedule or cancel her manometry and she chose to cancel. She stated she would call back to reschedule later.

## 2019-11-25 ENCOUNTER — Telehealth: Payer: Self-pay

## 2019-11-25 NOTE — Telephone Encounter (Signed)
  Follow up Call-  Call back number 11/23/2019  Post procedure Call Back phone  # [phone]  Permission to leave phone message Yes  Some recent data might be hidden     Patient questions:  Do you have a fever, pain , or abdominal swelling? No. Pain Score  0 *  Have you tolerated food without any problems? Yes.    Have you been able to return to your normal activities? Yes.    Do you have any questions about your discharge instructions: Diet   No. Medications  No. Follow up visit  No.  Do you have questions or concerns about your Care? No.  Actions: * If pain score is 4 or above: No action needed, pain <4.  1. Have you developed a fever since your procedure? no  2.   Have you had an respiratory symptoms (SOB or cough) since your procedure? no  3.   Have you tested positive for COVID 19 since your procedure no  4.   Have you had any family members/close contacts diagnosed with the COVID 19 since your procedure? no   If yes to any of these questions please route to Joylene John, RN and Erenest Rasher, RN

## 2019-11-26 ENCOUNTER — Encounter: Payer: Self-pay | Admitting: Gastroenterology

## 2019-12-25 ENCOUNTER — Encounter (HOSPITAL_COMMUNITY): Payer: Self-pay

## 2019-12-25 ENCOUNTER — Ambulatory Visit (HOSPITAL_COMMUNITY): Admit: 2019-12-25 | Payer: PRIVATE HEALTH INSURANCE | Admitting: Gastroenterology

## 2019-12-25 SURGERY — MANOMETRY, ANORECTAL

## 2020-03-24 ENCOUNTER — Other Ambulatory Visit: Payer: Self-pay | Admitting: Obstetrics and Gynecology

## 2020-08-28 NOTE — Progress Notes (Signed)
Cardiology Office Note Date:  08/30/2020  Patient ID:  Desiree, Schultz 1970/11/03, MRN [id number] PCP:  Dian Queen, MD  Cardiologist/Electrophysiologist: Dr. Curt Bears    Chief Complaint:  over due  History of Present Illness: Desiree Schultz is a 49 y.o. female with history of anxiety/depression, DM, HTN (pt denies this), hypothyroidism, OSA w./CPAP, obesity, PCOS. AFib  She comes in today to be seen for Dr. Curt Bears, last seen by him Dec 2019, at that time doing well, mentions not on a/c 2/2 low risk score.  Also discussed that she could probably stop the propafenone after completing her current rx  TODAY She says until her hyesterectomy (partial July 2021) she has developed some palpitations. These started about 31moago, and are different then her AFib. These are feeling of an extra as well as missing beats.  Not racing and not like her AFib felt. They are random, not every day without a clear trigger. He AFib was cyclical around her menses, these are more random.  Outside of this she is doing well. She works 2 jobs and generally pretty fatigued Sleeps well with 100% compliance with her CPAP. No dizziness, near syncope or syncope. No CP No difficulties with her ADLs  We reviewed her PMHx with an eye towards her CHA2DS2Vasc score. She denies a formal diagnosis of HTN, has been on spironolactone for years 2/2 he PCOS Score would then be 2 including female   Afib hx Diagnosed 2010 AFib ablation 01/17/2018  AAD Rythmol started 2016 > on/off >> off Multaq started 2013 stopped within a month "did not tolerate" ?edema intolerant of flecainide remotely (?) used daily dose OK and pill in the pocket strategy for some time Intolerant of diltiazem/BB with fatigue  eliquis > proteinuria and knee problems Xarelto> knee problems  Past Medical History:  Diagnosis Date  . Allergic rhinitis   . Anemia   . Atrial fibrillation ([facility]    2006  . Bronchial breathing   .  Chronic anxiety   . Depression   . Diabetes mellitus without complication ([facility]   . Dysrhythmia   . Elevated blood pressure    On therapy. Probably hypertension  . Fibroid   . Hx of cardiovascular stress test    a. Myoview 10/13:  no ischemia; EF 57%  . Hypothyroid   . Menorrhagia    2011  . Obesity (BMI 30-39.9)   . OSA (obstructive sleep apnea)    on CPAP  . PCOS (polycystic ovarian syndrome)   . PONV (postoperative nausea and vomiting)    C/o motion sickness  . Prediabetes   . Vitamin D deficiency     Past Surgical History:  Procedure Laterality Date  . ATRIAL FIBRILLATION ABLATION N/A 01/17/2018   Procedure: ATRIAL FIBRILLATION ABLATION;  Surgeon: CConstance Haw MD;  [facility];  Service: Cardiovascular;  Laterality: N/A;  . BLADDER SURGERY  1997   interstitial cystitis   . BREAST REDUCTION SURGERY  06/2013  . BREAST SURGERY     reduction  . DILATION AND CURETTAGE, DIAGNOSTIC / THERAPEUTIC    . ENDOMETRIAL ABLATION    . ENDOMETRIAL ABLATION  08/2010  . HAND SURGERY  2006   due to cat bite  . KNEE ARTHROSCOPY WITH MEDIAL MENISECTOMY Left 04/06/2015   Procedure: LEFT KNEE ARTHROSCOPY WITH PARTIAL MEDIAL MENISCECTOMY;  Surgeon: NLeandrew Koyanagi MD;  [facility]  Service: Orthopedics;  Laterality: Left;  . OVARIAN CYST REMOVAL  04/28/13  . REDUCTION  MAMMAPLASTY Bilateral 2014  . RENAL BIOPSY, OPEN  2006  . TONSILLECTOMY  2007    Current Outpatient Medications  Medication Sig Dispense Refill  . ascorbic acid (VITAMIN C) 1000 MG tablet Take 1 tablet by mouth daily.    . Blood Glucose Monitoring Suppl (FIFTY50 GLUCOSE METER 2.0) w/Device KIT Test as needed    . cetirizine (ZYRTEC) 10 MG tablet Take 1 tablet (10 mg total) by mouth daily. 30 tablet 11  . clotrimazole (LOTRIMIN) 1 % cream Apply topically daily in the afternoon.    . cyanocobalamin (,VITAMIN B-12,) 1000 MCG/ML injection Inject into the muscle every 30 (thirty) days.    Marland Kitchen escitalopram  (LEXAPRO) 20 MG tablet Take 1 tablet (20 mg total) by mouth daily. 30 tablet 11  . ipratropium (ATROVENT) 0.06 % nasal spray USE 2 SPRAYS IN BOTH  NOSTRILS 3 TIMES DAILY    . levothyroxine (SYNTHROID) 137 MCG tablet Take by mouth.    . liothyronine (CYTOMEL) 5 MCG tablet Take by mouth.    . metFORMIN (GLUCOPHAGE-XR) 500 MG 24 hr tablet Take 1,000 mg by mouth daily in the afternoon.    . Semaglutide,0.25 or 0.5MG/DOS, (OZEMPIC, 0.25 OR 0.5 MG/DOSE,) 2 MG/1.5ML SOPN Inject 1 mg into the skin once a week.    . spironolactone (ALDACTONE) 50 MG tablet Take 1 tablet by mouth daily.    . Vitamin D, Ergocalciferol, (DRISDOL) 1.25 MG (50000 UNIT) CAPS capsule Take 50,000 Units by mouth daily.     No current facility-administered medications for this visit.    Allergies:   Multaq [dronedarone], Benadryl [diphenhydramine hcl], Fish oil, Influenza vaccines, Latex, Oxycodone, Shellfish allergy, Tape, and Sulfa antibiotics   Social History:  The patient  reports that she quit smoking about 22 years ago. Her smoking use included cigarettes. She has a 32.00 pack-year smoking history. She has never used smokeless tobacco. She reports current alcohol use. She reports current drug use. Drug: Marijuana.   Family History:  The patient's family history includes Breast cancer in an other family member; Cardiomyopathy in her brother and father; Colon cancer in her maternal great-grandmother; Coronary artery disease in her maternal grandmother; Heart attack in her maternal grandfather and maternal grandmother; Heart disease in her father; Heart failure in her maternal great-grandmother; Hyperlipidemia in her father and maternal great-grandmother; Hypertension in her father and maternal great-grandmother.  ROS:  Please see the history of present illness.    All other systems are reviewed and otherwise negative.   PHYSICAL EXAM:  VS:  BP 110/78   Pulse 76   Ht 5' 5"  (1.651 m)   Wt 266 lb (120.7 kg)   SpO2 95%   BMI  44.26 kg/m  BMI: Body mass index is 44.26 kg/m. Well nourished, well developed, in no acute distress HEENT: normocephalic, atraumatic Neck: no JVD, carotid bruits or masses Cardiac:  RRR; no significant murmurs, no rubs, or gallops Lungs:  CTA b/l, no wheezing, rhonchi or rales Abd: soft, nontender, obese MS: no deformity or atrophy Ext:  no edema Skin: warm and dry, no rash Neuro:  No gross deficits appreciated Psych: euthymic mood, full affect   EKG:  Done today and reviewed by myself shows  SR 76bpm, unchanged   TTE 12/13/17  - Left ventricle: The cavity size was normal. Systolic function was normal. The estimated ejection fraction was in the range of 50% to 55%. Images were inadequate for LV wall motion assessment. Left ventricular diastolic function parameters were normal. - Tricuspid valve: There  was trivial regurgitation. - Recommendations: Suggest limited study with defininty contrast to accurately assess EF and wall motion as endocardial segments are not well visualized.  - Left atrium: The atrium was normal in size.  30 day monitor 07/15/18 Sinus rhythm with sinus tachycardia. Symptoms of palpitations associated with sinus rhythm and rare PVCs No atrial fibrillation noted   Recent Labs: No results found for requested labs within last 8760 hours.  No results found for requested labs within last 8760 hours.   CrCl cannot be calculated (Patient's most recent lab result is older than the maximum 21 days allowed.).   Wt Readings from Last 3 Encounters:  08/30/20 266 lb (120.7 kg)  11/23/19 276 lb (125.2 kg)  08/05/18 276 lb 12.8 oz (125.6 kg)     Other studies reviewed: Additional studies/records reviewed today include: summarized above  ASSESSMENT AND PLAN:  1. Paroxysmal Afib     CHA2DS2Vasc is 2 (female and DM, she denies HTN)     Off Rhythmol for years     No symptoms of her AFib, though has developed new type of palpitations  Plan update  her echo and get 30day monitor   Disposition: F/u with Dr. Curt Bears in 51mo sooner if needed.  Current medicines are reviewed at length with the patient today.  The patient did not have any concerns regarding medicines.  SVenetia Night PA-C 08/30/2020 12:39 PM     [place] [phone](office)  [phone](fax)

## 2020-08-30 ENCOUNTER — Other Ambulatory Visit: Payer: Self-pay

## 2020-08-30 ENCOUNTER — Encounter: Payer: Self-pay | Admitting: Physician Assistant

## 2020-08-30 ENCOUNTER — Ambulatory Visit: Payer: PRIVATE HEALTH INSURANCE | Admitting: Physician Assistant

## 2020-08-30 VITALS — BP 110/78 | HR 76 | Ht 65.0 in | Wt 266.0 lb

## 2020-08-30 DIAGNOSIS — I480 Paroxysmal atrial fibrillation: Secondary | ICD-10-CM

## 2020-08-30 DIAGNOSIS — R002 Palpitations: Secondary | ICD-10-CM

## 2020-08-30 NOTE — Patient Instructions (Addendum)
Medication Instructions:    Your physician recommends that you continue on your current medications as directed. Please refer to the Current Medication list given to you today.  *If you need a refill on your cardiac medications before your next appointment, please call your pharmacy*   Lab Work:  [place]   If you have labs (blood work) drawn today and your tests are completely normal, you will receive your results only by: Marland Kitchen MyChart Message (if you have MyChart) OR . A paper copy in the mail If you have any lab test that is abnormal or we need to change your treatment, we will call you to review the results.   Testing/Procedures: Your physician has requested that you have an echocardiogram. Echocardiography is a painless test that uses sound waves to create images of your heart. It provides your doctor with information about the size and shape of your heart and how well your heart's chambers and valves are working. This procedure takes approximately one hour. There are no restrictions for this procedure.  Your physician has recommended that you wear an event monitor. Event monitors are medical devices that record the heart's electrical activity. Doctors most often [place] these monitors to diagnose arrhythmias. Arrhythmias are problems with the speed or rhythm of the heartbeat. The monitor is a small, portable device. You can wear one while you do your normal daily activities. This is usually used to diagnose what is causing palpitations/syncope (passing out).   Follow-Up: At [facility], you and your health needs are our priority.  As part of our continuing mission to provide you with exceptional heart care, we have created designated Provider Care Teams.  These Care Teams include your primary Cardiologist (physician) and Advanced Practice Providers (APPs -  Physician Assistants and Nurse Practitioners) who all work together to provide you with the care you need, when you need  it.  We recommend signing up for the patient portal called "MyChart".  Sign up information is provided on this After Visit Summary.  MyChart is used to connect with patients for Virtual Visits (Telemedicine).  Patients are able to view lab/test results, encounter notes, upcoming appointments, etc.  Non-urgent messages can be sent to your provider as well.   To learn more about what you can do with MyChart, go to [email or web address].    Your next appointment:   2 month(s)  The format for your next appointment:   In Person  Provider:   Allegra Lai, MD   Other Instructions  [employer] Cardiac Event Monitor Instructions  Your physician has requested you wear your cardiac event monitor for __30  days, (1-30). [employer] may call or text to confirm a shipping address. The monitor will be sent to a land address via UPS. [employer] will not ship a monitor to a PO BOX. It typically takes 3-5 days to receive your monitor after it has been enrolled. [employer] will assist with USPS tracking if your package is delayed. The telephone number for [employer] is [phone]. Once you have received your monitor, please review the enclosed instructions. Instruction tutorials can also be viewed under help and settings on the enclosed cell phone. Your monitor has already been registered assigning a specific monitor serial # to you.  Applying the monitor Remove cell phone from case and turn it on. The cell phone works as Dealer and needs to be within [employer] of you at all times. The cell phone will need to be charged on a daily basis.  We recommend you plug the cell phone into the enclosed charger at your bedside table every night.  Monitor batteries: You will receive two monitor batteries labelled #1 and #2. These are your recorders. Plug battery #2 onto the second connection on the enclosed charger. Keep one battery on the charger at all times. This will keep the monitor battery  deactivated. It will also keep it fully charged for when you need to switch your monitor batteries. A small light will be blinking on the battery emblem when it is charging. The light on the battery emblem will remain on when the battery is fully charged.  Open package of a Monitor strip. Insert battery #1 into black hood on strip and gently squeeze monitor battery onto connection as indicated in instruction booklet. Set aside while preparing skin.  Choose location for your strip, vertical or horizontal, as indicated in the instruction booklet. Shave to remove all hair from location. There cannot be any lotions, oils, powders, or colognes on skin where monitor is to be applied. Wipe skin clean with enclosed Saline wipe. Dry skin completely.  Peel paper labeled #1 off the back of the Monitor strip exposing the adhesive. Place the monitor on the chest in the vertical or horizontal position shown in the instruction booklet. One arrow on the monitor strip must be pointing upward. Carefully remove paper labeled #2, attaching remainder of strip to your skin. Try not to create any folds or wrinkles in the strip as you apply it.  Firmly press and release the circle in the center of the monitor battery. You will hear a small beep. This is turning the monitor battery on. The heart emblem on the monitor battery will light up every 5 seconds if the monitor battery in turned on and connected to the patient securely. Do not push and hold the circle down as this turns the monitor battery off. The cell phone will locate the monitor battery. A screen will appear on the cell phone checking the connection of your monitor strip. This may read poor connection initially but change to good connection within the next minute. Once your monitor accepts the connection you will hear a series of 3 beeps followed by a climbing crescendo of beeps. A screen will appear on the cell phone showing the two monitor strip  placement options. Touch the picture that demonstrates where you applied the monitor strip.  Your monitor strip and battery are waterproof. You are able to shower, bathe, or swim with the monitor on. They just ask you do not submerge deeper than 3 feet underwater. We recommend removing the monitor if you are swimming in a lake, river, or ocean.  Your monitor battery will need to be switched to a fully charged monitor battery approximately once a week. The cell phone will alert you of an action which needs to be made.  On the cell phone, tap for details to reveal connection status, monitor battery status, and cell phone battery status. The green dots indicates your monitor is in good status. A red dot indicates there is something that needs your attention.  To record a symptom, click the circle on the monitor battery. In 30-60 seconds a list of symptoms will appear on the cell phone. Select your symptom and tap save. Your monitor will record a sustained or significant arrhythmia regardless of you clicking the button. Some patients do not feel the heart rhythm irregularities. [employer] will notify us of any serious or critical events.  Refer to  instruction booklet for instructions on switching batteries, changing strips, the Do not disturb or Pause features, or any additional questions.  Call [employer] at [phone], to confirm your monitor is transmitting and record your baseline. They will answer any questions you may have regarding the monitor instructions at that time.  Returning the monitor to [employer] Place all equipment back into blue box. Peel off strip of paper to expose adhesive and close box securely. There is a prepaid UPS shipping label on this box. Drop in a UPS drop box, or at a UPS facility like [employer]. You may also contact [employer] to arrange UPS to pick up monitor package at your home.

## 2020-08-30 NOTE — Addendum Note (Signed)
Addended by: Oleta Mouse on: 08/30/2020 01:35 PM   Modules accepted: Orders

## 2020-09-23 ENCOUNTER — Other Ambulatory Visit: Payer: Self-pay

## 2020-09-23 ENCOUNTER — Ambulatory Visit (HOSPITAL_COMMUNITY): Payer: PRIVATE HEALTH INSURANCE | Attending: Cardiology

## 2020-09-23 DIAGNOSIS — I480 Paroxysmal atrial fibrillation: Secondary | ICD-10-CM

## 2020-09-23 DIAGNOSIS — R002 Palpitations: Secondary | ICD-10-CM | POA: Insufficient documentation

## 2020-09-23 LAB — ECHOCARDIOGRAM COMPLETE
Area-P 1/2: 3.5 cm2
S' Lateral: 3.4 cm

## 2020-10-04 ENCOUNTER — Telehealth: Payer: Self-pay | Admitting: Physician Assistant

## 2020-10-04 NOTE — Telephone Encounter (Signed)
    Pt said when she received the heart monitor she didn't get a chance to wear it, due to she had a changed in medications then she got covid. She said the company is calling her now to send back the heart monitor. She wanted to know if she can still wear it and get an extension

## 2020-10-05 NOTE — Telephone Encounter (Signed)
It is ok to start the monitor now.  Just give [employer] a call at [phone] when you apply it to confirm baseline recording and let them know you are just starting the 30 day monitor.

## 2020-10-06 ENCOUNTER — Other Ambulatory Visit: Payer: Self-pay | Admitting: Obstetrics and Gynecology

## 2020-10-06 ENCOUNTER — Other Ambulatory Visit: Payer: Self-pay | Admitting: Physician Assistant

## 2020-10-06 DIAGNOSIS — Z1231 Encounter for screening mammogram for malignant neoplasm of breast: Secondary | ICD-10-CM

## 2020-10-12 ENCOUNTER — Other Ambulatory Visit: Payer: Self-pay | Admitting: Obstetrics and Gynecology

## 2020-10-12 DIAGNOSIS — N644 Mastodynia: Secondary | ICD-10-CM

## 2020-10-19 ENCOUNTER — Other Ambulatory Visit: Payer: PRIVATE HEALTH INSURANCE

## 2020-11-10 NOTE — Progress Notes (Deleted)
Cardiology Office Note Date:  11/10/2020  Patient ID:  Desiree, Schultz 05/15/1971, MRN [id number] PCP:  Dian Queen, MD  Cardiologist/Electrophysiologist: Dr. Curt Bears    Chief Complaint:  over due  History of Present Illness: Desiree Schultz is a 50 y.o. female with history of anxiety/depression, DM, HTN (pt denies this), hypothyroidism, OSA w./CPAP, obesity, PCOS. AFib  She comes in today to be seen for Dr. Curt Bears, last seen by him Dec 2019, at that time doing well, mentions not on a/c 2/2 low risk score.  Also discussed that she could probably stop the propafenone after completing her current rx  I saw her 08/30/20 She says until her hyesterectomy (partial July 2021) she has developed some palpitations. These started about 34moago, and are different then her AFib. These are feeling of an extra as well as missing beats.  Not racing and not like her AFib felt. They are random, not every day without a clear trigger. He AFib was cyclical around her menses, these are more random.  Outside of this she is doing well. She works 2 jobs and generally pretty fatigued Sleeps well with 100% compliance with her CPAP. No dizziness, near syncope or syncope. No CP No difficulties with her ADLs We reviewed her PMHx with an eye towards her CHA2DS2Vasc score. She denies a formal diagnosis of HTN, has been on spironolactone for years 2/2 he PCOS Score would then be 2 including female Planned to update her echo and 30day monitoring  Echo noted LVEF 55-60%, no WMA< RV OK, no significant VHD She was delayed in getting started on the monitor, doesn't look like she started until after 10/04/20 by notes, no result yet  *** COVID Feb? *** symptoms, palps *** monitor? *** labs?  Afib hx Diagnosed 2010 AFib ablation 01/17/2018  AAD Rythmol started 2016 > on/off >> off Multaq started 2013 stopped within a month "did not tolerate" ?edema intolerant of flecainide remotely (?) used daily dose  OK and pill in the pocket strategy for some time Intolerant of diltiazem/BB with fatigue  eliquis > proteinuria and knee problems Xarelto> knee problems  Past Medical History:  Diagnosis Date  . Allergic rhinitis   . Anemia   . [place]rial fibrillation ([facility]    2006  . Bronchial breathing   . Chronic anxiety   . Depression   . Diabetes mellitus without complication ([facility]   . Dysrhythmia   . Elevated blood pressure    On therapy. Probably hypertension  . Fibroid   . Hx of cardiovascular stress test    a. Myoview 10/13:  no ischemia; EF 57%  . Hypothyroid   . Menorrhagia    2011  . Obesity (BMI 30-39.9)   . OSA (obstructive sleep apnea)    on CPAP  . PCOS (polycystic ovarian syndrome)   . PONV (postoperative nausea and vomiting)    C/o motion sickness  . Prediabetes   . Vitamin D deficiency     Past Surgical History:  Procedure Laterality Date  . ATRIAL FIBRILLATION ABLATION N/A 01/17/2018   Procedure: ATRIAL FIBRILLATION ABLATION;  Surgeon: CConstance Haw MD;  [facility];  Service: Cardiovascular;  Laterality: N/A;  . BLADDER SURGERY  1997   interstitial cystitis   . BREAST REDUCTION SURGERY  06/2013  . BREAST SURGERY     reduction  . DILATION AND CURETTAGE, DIAGNOSTIC / THERAPEUTIC    . ENDOMETRIAL ABLATION    . ENDOMETRIAL ABLATION  08/2010  . HAND  SURGERY  2006   due to cat bite  . KNEE ARTHROSCOPY WITH MEDIAL MENISECTOMY Left 04/06/2015   Procedure: LEFT KNEE ARTHROSCOPY WITH PARTIAL MEDIAL MENISCECTOMY;  Surgeon: Leandrew Koyanagi, MD;  Location: [facility];  Service: Orthopedics;  Laterality: Left;  . OVARIAN CYST REMOVAL  04/28/13  . REDUCTION MAMMAPLASTY Bilateral 2014  . RENAL BIOPSY, OPEN  2006  . TONSILLECTOMY  2007    Current Outpatient Medications  Medication Sig Dispense Refill  . ascorbic acid (VITAMIN C) 1000 MG tablet Take 1 tablet by mouth daily.    . Blood Glucose Monitoring Suppl (FIFTY50 GLUCOSE METER 2.0) w/Device KIT Test as  needed    . cetirizine (ZYRTEC) 10 MG tablet Take 1 tablet (10 mg total) by mouth daily. 30 tablet 11  . clotrimazole (LOTRIMIN) 1 % cream Apply topically daily in the afternoon.    . cyanocobalamin (,VITAMIN B-12,) 1000 MCG/ML injection Inject into the muscle every 30 (thirty) days.    Marland Kitchen escitalopram (LEXAPRO) 20 MG tablet Take 1 tablet (20 mg total) by mouth daily. 30 tablet 11  . ipratropium (ATROVENT) 0.06 % nasal spray USE 2 SPRAYS IN BOTH  NOSTRILS 3 TIMES DAILY    . levothyroxine (SYNTHROID) 137 MCG tablet Take by mouth.    . liothyronine (CYTOMEL) 5 MCG tablet Take by mouth.    . metFORMIN (GLUCOPHAGE-XR) 500 MG 24 hr tablet Take 1,000 mg by mouth daily in the afternoon.    . Semaglutide,0.25 or 0.5MG/DOS, (OZEMPIC, 0.25 OR 0.5 MG/DOSE,) 2 MG/1.5ML SOPN Inject 1 mg into the skin once a week.    . spironolactone (ALDACTONE) 50 MG tablet Take 1 tablet by mouth daily.    . Vitamin D, Ergocalciferol, (DRISDOL) 1.25 MG (50000 UNIT) CAPS capsule Take 50,000 Units by mouth daily.     No current facility-administered medications for this visit.    Allergies:   Multaq [dronedarone], Benadryl [diphenhydramine hcl], Fish oil, Influenza vaccines, Latex, Oxycodone, Shellfish allergy, Tape, and Sulfa antibiotics   Social History:  The patient  reports that she quit smoking about 22 years ago. Her smoking use included cigarettes. She has a 32.00 pack-year smoking history. She has never used smokeless tobacco. She reports current alcohol use. She reports current drug use. Drug: Marijuana.   Family History:  The patient's family history includes Breast cancer in an other family member; Cardiomyopathy in her brother and father; Colon cancer in her maternal great-grandmother; Coronary artery disease in her maternal grandmother; Heart attack in her maternal grandfather and maternal grandmother; Heart disease in her father; Heart failure in her maternal great-grandmother; Hyperlipidemia in her father and  maternal great-grandmother; Hypertension in her father and maternal great-grandmother.  ROS:  Please see the history of present illness.    All other systems are reviewed and otherwise negative.   PHYSICAL EXAM:  VS:  There were no vitals taken for this visit. BMI: There is no height or weight on file to calculate BMI. Well nourished, well developed, in no acute distress HEENT: normocephalic, atraumatic Neck: no JVD, carotid bruits or masses Cardiac:  *** RRR; no significant murmurs, no rubs, or gallops Lungs:  *** CTA b/l, no wheezing, rhonchi or rales Abd: soft, nontender, obese MS: no deformity or atrophy Ext:  *** no edema Skin: warm and dry, no rash Neuro:  No gross deficits appreciated Psych: euthymic mood, full affect   EKG:  Not done today   09/23/2020: TTE IMPRESSIONS  1. Left ventricular ejection fraction, by estimation, is 55 to  60%. The  left ventricle has normal function. The left ventricle has no regional  wall motion abnormalities. Left ventricular diastolic parameters were  normal.  2. Right ventricular systolic function is normal. The right ventricular  size is normal.  3. The mitral valve is normal in structure. Trivial mitral valve  regurgitation. No evidence of mitral stenosis.  4. The aortic valve is tricuspid. Aortic valve regurgitation is not  visualized. No aortic stenosis is present.  5. The inferior vena cava is normal in size with greater than 50%  respiratory variability, suggesting right atrial pressure of 3 mmHg.    TTE 12/13/17  - Left ventricle: The cavity size was normal. Systolic function was normal. The estimated ejection fraction was in the range of 50% to 55%. Images were inadequate for LV wall motion assessment. Left ventricular diastolic function parameters were normal. - Tricuspid valve: There was trivial regurgitation. - Recommendations: Suggest limited study with defininty contrast to accurately assess EF and wall  motion as endocardial segments are not well visualized.  - Left atrium: The atrium was normal in size.  30 day monitor 07/15/18 Sinus rhythm with sinus tachycardia. Symptoms of palpitations associated with sinus rhythm and rare PVCs No atrial fibrillation noted   Recent Labs: No results found for requested labs within last 8760 hours.  No results found for requested labs within last 8760 hours.   CrCl cannot be calculated (Patient's most recent lab result is older than the maximum 21 days allowed.).   Wt Readings from Last 3 Encounters:  08/30/20 266 lb (120.7 kg)  11/23/19 276 lb (125.2 kg)  08/05/18 276 lb 12.8 oz (125.6 kg)     Other studies reviewed: Additional studies/records reviewed today include: summarized above  ASSESSMENT AND PLAN:  1. Paroxysmal Afib     CHA2DS2Vasc is 2 (female and DM, she denies HTN)     Off Rhythmol for years     No symptoms of her AFib,     ***    Disposition: ***  Current medicines are reviewed at length with the patient today.  The patient did not have any concerns regarding medicines.  Venetia Night, PA-C 11/10/2020 7:52 PM     [address] [place] [address] [phone] (office)  [phone] (fax)

## 2020-11-11 ENCOUNTER — Ambulatory Visit: Payer: PRIVATE HEALTH INSURANCE | Admitting: Physician Assistant

## 2020-11-11 ENCOUNTER — Telehealth: Payer: Self-pay | Admitting: *Deleted

## 2020-11-11 NOTE — Telephone Encounter (Signed)
Spoke with patient about wearing monitor and process to do that. Patient will be rescheduled out 6 weeks form now to allow time to wear monitor and go through that process. Patient was given number to monitor again and contact name for our department help [facility] or Valetta Fuller if assistance is needed.

## 2020-11-30 ENCOUNTER — Other Ambulatory Visit: Payer: PRIVATE HEALTH INSURANCE

## 2020-12-15 ENCOUNTER — Ambulatory Visit
Admission: RE | Admit: 2020-12-15 | Discharge: 2020-12-15 | Disposition: A | Discharge status: Home / Self Care | Payer: PRIVATE HEALTH INSURANCE | Source: Ambulatory Visit | Attending: Obstetrics and Gynecology | Admitting: Obstetrics and Gynecology

## 2020-12-15 ENCOUNTER — Other Ambulatory Visit: Payer: Self-pay

## 2020-12-15 DIAGNOSIS — N644 Mastodynia: Secondary | ICD-10-CM

## 2020-12-16 ENCOUNTER — Other Ambulatory Visit: Payer: PRIVATE HEALTH INSURANCE

## 2020-12-23 ENCOUNTER — Ambulatory Visit: Payer: PRIVATE HEALTH INSURANCE | Admitting: Physician Assistant

## 2021-01-27 ENCOUNTER — Other Ambulatory Visit: Payer: Self-pay

## 2021-01-27 ENCOUNTER — Ambulatory Visit: Payer: Self-pay

## 2021-01-27 ENCOUNTER — Ambulatory Visit (INDEPENDENT_AMBULATORY_CARE_PROVIDER_SITE_OTHER): Payer: PRIVATE HEALTH INSURANCE | Admitting: Family Medicine

## 2021-01-27 VITALS — BP 126/78 | Ht 65.5 in | Wt 270.0 lb

## 2021-01-27 DIAGNOSIS — M79672 Pain in left foot: Secondary | ICD-10-CM

## 2021-01-27 DIAGNOSIS — M722 Plantar fascial fibromatosis: Secondary | ICD-10-CM

## 2021-01-27 NOTE — Progress Notes (Signed)
Office Visit Note   Patient: Desiree Schultz           Date of Birth: Feb 21, 1971           MRN: [id number] Visit Date: 01/27/2021 Requested by: Dian Queen, [address] [place],  [place] [address] PCP: Dian Queen, MD  Subjective: CC: Left foot pain  HPI: 50 year old female with past medical history of plantar fasciitis who is presenting to clinic today with recurrence of the same. Patient states that she has struggled with PF for about 4 years, though it will wax and wane in severity. Several years ago, she had an injection on the bottom of her foot, which she states was excruciating, though it did work very well. Since her pain returned (about 4 months ago), she has been seeing a chiropractor for percussive therapy- which she feels is helpful. She has been doing stretches, and she bought some insoles. She denies having been given any foot strengthening exercises. She says she would like to avoid an injection now if possible, since it was so uncomfortable, but isn't sure what else she can do to improve her pain. States the mornings are extremely uncomfortable, but when she puts her new insoles on the pain will improve throughout the day. Pain is focused primarily on the anterior aspect of her calcaneous, and she says she is worried she 'may have a bone spur.'                Objective: Vital Signs: BP 126/78   Ht 5' 5.5" (1.664 m)   Wt 270 lb (122.5 kg)   LMP 07/22/2019   BMI 44.25 kg/m  No flowsheet data found.   No flowsheet data found.  Physical Exam:  General:  Alert and oriented, in no acute distress. Pulm:  Breathing unlabored. Psy:  Normal mood, congruent affect. Skin:  Left Foot without bruises, rashes, or erythema. Overlying skin intact.   Left foot/ankle exam: General assessment: Normal Gait.  Inspection: Moderate pes planus bilaterally.  Minimal calcaneal inversion with toe standing on left, however normal calcaneal inversion on right.  No  significant toe splay or hammering.  No significant swelling or deformity.  Seated Exam: Ankle and all toes with full range of motion without pain.  Palpation: Endorses tenderness to palpation over the anterior aspect of the calcaneus.  No tenderness over the posterior tibialis as it courses around the medial malleolus.  No pain with calf or Achilles squeeze.  No Tenderness over the peroneal tendons. No bony tenderness to palpation over the 5th metatarsal or within the midfoot.    Ligamentous Examination:  Anterior drawer without anterior slide Talar Tilt appropriate No Tenderness over the Anterior joint line No pain with external rotation ('High ankle') testing.   Strength: 5/5 in eversion, inversion, and plantar/dorsiflexion Normal distal sensation   Imaging: Limited Extremity US- Left Foot:  Left medial ankle demonstrates trace effusion around posterior tibialis tendon as it courses along the medial malleolus.  No intratendinous irregularities, and fibers appear intact.  No swelling or defects appreciated within the flexor digitorum or flexor hallux muscles. Width of the plantar fascia at the anterior calcaneus insertion site is approximately the 0.5 centimeters. There is a small amount of spurring over the calcaneous.  Fibers of the plantar fascia appear intact.  Assessment & Plan: 50 year old female presenting to clinic today with acute on chronic left plantar fasciitis.  On examination, she also has decreased firing in her posterior tibialis musculature, with  minimal surrounding effusion around this tendon on ultrasound.  She has extensively tried conservative treatments, including percussive therapy, insoles, and stretches. -Given the trace fluid around the posterior tibialis tendon, will add in rehabilitation exercises for the posterior tibialis and hopes of furthering her recovery.  We will also educate on foot intrinsic muscle exercises. -If she does not experience any improvement  with foot and ankle strengthening exercises, she is to return to clinic in approximately 3 weeks for consideration of repeat injection.  She has had great improvement in the past with this modality. -Patient expresses understanding with plan.  She had no further questions or concerns today.    Procedures: No procedures performed      Addendum:  I was the preceptor for this visit and available for immediate consultation.  Karlton Lemon MD Kirt Boys

## 2021-04-03 ENCOUNTER — Other Ambulatory Visit: Payer: Self-pay

## 2021-04-03 ENCOUNTER — Ambulatory Visit: Payer: BC Managed Care – PPO | Admitting: Family Medicine

## 2021-04-03 VITALS — BP 128/73 | Ht 66.0 in | Wt 268.0 lb

## 2021-04-03 DIAGNOSIS — M722 Plantar fascial fibromatosis: Secondary | ICD-10-CM | POA: Diagnosis not present

## 2021-04-03 MED ORDER — METHYLPREDNISOLONE ACETATE 40 MG/ML IJ SUSP
40.0000 mg | Freq: Once | INTRAMUSCULAR | Status: AC
  Administered 2021-04-03: 40 mg via INTRA_ARTICULAR

## 2021-04-03 NOTE — Progress Notes (Signed)
Patient returned today for plantar fascia injection as discussed at last visit.  After informed written consent timeout was performed.  Patient was lying supine on exam table.  Area overlying left plantar fascia medially prepped with alcohol swab.  58m lidocaine used for local anesthesia.  Then utilizing ultrasound guidance with a medial approach patient's left plantar fascia was injected with 2:1 lidocaine: depomedrol.  Patient tolerated procedure well without immediate complications.

## 2021-04-03 NOTE — Addendum Note (Signed)
Addended by: Jolinda Croak E on: 04/03/2021 05:23 PM   Modules accepted: Orders

## 2021-04-26 ENCOUNTER — Telehealth: Payer: Self-pay | Admitting: Family Medicine

## 2021-04-26 NOTE — Telephone Encounter (Signed)
Spoke with pt, she will start using Celebrex again for the time being. Will check with her insurance company about cost of custom orthotics. She will call us and schedule an appt for orthotics if she chooses to go that route. She also thinks she's tried soundwave therapy before at the chiropractors office. She didn't get any relief with this. I told her she should come in for f/u to discuss next steps with Dr. Barbaraann Barthel if she decides to not go with custom orthotics. Pt understands and agrees with the plan.

## 2021-04-26 NOTE — Telephone Encounter (Signed)
-----   Message from [facility], LAT sent at 04/26/2021  9:04 AM EDT ----- Regarding: FW: phone message  ----- Message ----- From: Carolyne Littles Sent: 04/26/2021   8:18 AM EDT To: Jolinda Croak, LAT Subject: phone message                                  Pt called stating she is still having foot pain, Celebrex is the only thing that helps with pain. But she doesn't want to continue taking it.

## 2021-04-26 NOTE — Telephone Encounter (Signed)
I'd prefer she not take it long term either but she can continue it for now while we try some other measures - ok to send in refill if she needs that.  We can see her back in the office and discuss shockwave therapy.  I know she's tried a lot of insoles, shoes, the stretches.  This is something worth considering.

## 2021-11-01 ENCOUNTER — Other Ambulatory Visit: Payer: Self-pay | Admitting: Obstetrics and Gynecology

## 2021-11-01 DIAGNOSIS — Z1231 Encounter for screening mammogram for malignant neoplasm of breast: Secondary | ICD-10-CM

## 2021-12-19 ENCOUNTER — Ambulatory Visit
Admission: RE | Admit: 2021-12-19 | Discharge: 2021-12-19 | Disposition: A | Discharge status: Home / Self Care | Payer: BC Managed Care – PPO | Source: Ambulatory Visit | Attending: Obstetrics and Gynecology | Admitting: Obstetrics and Gynecology

## 2021-12-19 DIAGNOSIS — Z1231 Encounter for screening mammogram for malignant neoplasm of breast: Secondary | ICD-10-CM

## 2022-01-10 NOTE — Progress Notes (Signed)
? ?Cardiology Office Note ?Date:  01/15/2022  ?Patient ID:  Desiree Schultz, DOB 1971/02/28, MRN [id number] ?PCP:  Dian Queen, MD  ?Cardiologist/Electrophysiologist: Dr. Curt Bears ? ?  ?Chief Complaint:  over due ? ?History of Present Illness: ?Desiree Schultz is a 51 y.o. female with history of anxiety/depression, DM, HTN (pt denies this), hypothyroidism, OSA w./CPAP, obesity, PCOS, AFib ? ?She comes in today to be seen for Dr. Curt Bears, last seen by him Dec 2019, at that time doing well, mentions not on a/c 2/2 low risk score.  Also discussed that she could probably stop the propafenone after completing her current rx ? ?I saw her Dec 2021 ?She says until her hyesterectomy (partial July 2021) she has developed some palpitations. ?These started about 71moago, and are different then her AFib. ?These are feeling of an extra as well as missing beats.  Not racing and not like her AFib felt. ?They are random, not every day without a clear trigger. ?He AFib was cyclical around her menses, these are more random. ?Outside of this she is doing well. ?She works 2 jobs and generally pretty fatigued ?Sleeps well with 100% compliance with her CPAP. ?No dizziness, near syncope or syncope. ?No CP ?No difficulties with her ADLs ?We reviewed her PMHx with an eye towards her CHA2DS2Vasc score. ?She denies a formal diagnosis of HTN, has been on spironolactone for years 2/2 he PCOS ?Score would then be 2 including female ?Planned to update her echo and get a monitor with some new palpitations ? ?LVEF was 55-60%, no significant VHD ?Monitor was not completed ? ?TODAY ?She is worried about heart disease. ?She has had a couple of episodes at least in the year that she had some chest discomfort, a dull ache that has lasted as long as a couple days, waxes/wanes, L chest, associated with some belching as well. ?The last a couple weeks ago after eating fried chicken (unusual for her). ?She wonders about GI cause, though would like her heart  checked.  In some ways these episodes remind her of her Afib, but not quite. ?No syncope, near syncope.  No particular SOB ?Chest discomfort not always associated with food or exertion, can come on randomly ?She continues to work 2 jobs, sleeps poorly ? ? ? ?Afib hx ?Diagnosed 2010 ?AFib ablation 01/17/2018 ? ?AAD ?Rythmol started 2016 > on/off >> off ?Multaq started 2013 stopped within a month "did not tolerate" ?edema ?intolerant of flecainide remotely (?) used daily dose OK and pill in the pocket strategy for some time ?Intolerant of diltiazem/BB with fatigue ? ?eliquis > proteinuria and knee problems ?Xarelto> knee problems ? ?Past Medical History:  ?Diagnosis Date  ? Allergic rhinitis   ? Anemia   ? Atrial fibrillation ([facility]   ? 2006  ? Bronchial breathing   ? Chronic anxiety   ? Depression   ? [place] ([place]   ? Dysrhythmia   ? Elevated blood pressure   ? On therapy. Probably hypertension  ? Fibroid   ? Hx of cardiovascular stress test   ? a. Myoview 10/13:  no ischemia; EF 57%  ? Hypothyroid   ? Menorrhagia   ? 2011  ? Obesity (BMI 30-39.9)   ? OSA (obstructive sleep apnea)   ? on CPAP  ? PCOS (polycystic ovarian syndrome)   ? PONV (postoperative nausea and vomiting)   ? C/o motion sickness  ? Prediabetes   ? Vitamin D deficiency   ? ? ?Past Surgical History:  ?  Procedure Laterality Date  ? ATRIAL FIBRILLATION ABLATION N/A 01/17/2018  ? Procedure: ATRIAL FIBRILLATION ABLATION;  Surgeon: Constance Haw, MD;  Location: [facility];  Service: Cardiovascular;  Laterality: N/A;  ? BLADDER SURGERY  1997  ? interstitial cystitis   ? BREAST REDUCTION SURGERY  06/2013  ? BREAST SURGERY    ? reduction  ? DILATION AND CURETTAGE, DIAGNOSTIC / THERAPEUTIC    ? ENDOMETRIAL ABLATION    ? ENDOMETRIAL ABLATION  08/2010  ? HAND SURGERY  2006  ? due to cat bite  ? KNEE ARTHROSCOPY WITH MEDIAL MENISECTOMY Left 04/06/2015  ? Procedure: LEFT KNEE ARTHROSCOPY WITH PARTIAL MEDIAL MENISCECTOMY;   Surgeon: Leandrew Koyanagi, MD;  Location: [facility];  Service: Orthopedics;  Laterality: Left;  ? OVARIAN CYST REMOVAL  04/28/13  ? REDUCTION MAMMAPLASTY Bilateral 2014  ? RENAL BIOPSY, OPEN  2006  ? TONSILLECTOMY  2007  ? ? ?Current Outpatient Medications  ?Medication Sig Dispense Refill  ? ascorbic acid (VITAMIN C) 1000 MG tablet Take 1 tablet by mouth daily.    ? Blood Glucose Monitoring Suppl (FIFTY50 GLUCOSE METER 2.0) w/Device KIT Test as needed    ? cetirizine (ZYRTEC) 10 MG tablet Take 1 tablet (10 mg total) by mouth daily. 30 tablet 11  ? clotrimazole (LOTRIMIN) 1 % cream Apply topically daily in the afternoon.    ? cyanocobalamin (,VITAMIN B-12,) 1000 MCG/ML injection Inject into the muscle every 30 (thirty) days.    ? escitalopram (LEXAPRO) 20 MG tablet Take 1 tablet (20 mg total) by mouth daily. 30 tablet 11  ? ipratropium (ATROVENT) 0.06 % nasal spray USE 2 SPRAYS IN BOTH  NOSTRILS 3 TIMES DAILY    ? levothyroxine (SYNTHROID) 137 MCG tablet Take by mouth.    ? liothyronine (CYTOMEL) 5 MCG tablet Take by mouth.    ? spironolactone (ALDACTONE) 50 MG tablet Take 1 tablet by mouth daily.    ? ?No current facility-administered medications for this visit.  ? ? ?Allergies:   Multaq [dronedarone], Benadryl [diphenhydramine hcl], Diphenhydramine, Fish oil, Influenza vaccines, Influenza virus vaccine, Latex, Oxycodone, Pedi-pre tape spray [wound dressing adhesive], Shellfish allergy, Shrimp extract allergy skin test, Tape, and Sulfa antibiotics  ? ?Social History:  The patient  reports that she quit smoking about 23 years ago. Her smoking use included cigarettes. She has a 32.00 pack-year smoking history. She has never used smokeless tobacco. She reports current alcohol use. She reports current drug use. Drug: Marijuana.  ? ?Family History:  The patient's family history includes Cardiomyopathy in her brother and father; Colon cancer in her maternal great-grandmother; Coronary artery disease in her maternal grandmother;  Heart attack in her maternal grandfather and maternal grandmother; Heart disease in her father; Heart failure in her maternal great-grandmother; Hyperlipidemia in her father and maternal great-grandmother; Hypertension in her father and maternal great-grandmother. ? ?ROS:  Please see the history of present illness.    ?All other systems are reviewed and otherwise negative.  ? ?PHYSICAL EXAM:  ?VS:  BP 108/78   Pulse 66   Ht 5' 6"  (1.676 m)   Wt 281 lb (127.5 kg)   LMP 07/22/2019   SpO2 98%   BMI 45.35 kg/m?  BMI: Body mass index is 45.35 kg/m?. ?Well nourished, well developed, in no acute distress ?HEENT: normocephalic, atraumatic ?Neck: no JVD, carotid bruits or masses ?Cardiac:  RRR; no significant murmurs, no rubs, or gallops ?Lungs:  CTA b/l, no wheezing, rhonchi or rales ?Abd: soft, nontender, obese ?MS:  no deformity or atrophy ?Ext:  no edema ?Skin: warm and dry, no rash ?Neuro:  No gross deficits appreciated ?Psych: euthymic mood, full affect ? ? ?EKG:  Done today and reviewed by myself shows  ?SR 66bpm, no changes ? ?09/23/20: TTE ? 1. Left ventricular ejection fraction, by estimation, is 55 to 60%. The  ?left ventricle has normal function. The left ventricle has no regional  ?wall motion abnormalities. Left ventricular diastolic parameters were  ?normal.  ? 2. Right ventricular systolic function is normal. The right ventricular  ?size is normal.  ? 3. The mitral valve is normal in structure. Trivial mitral valve  ?regurgitation. No evidence of mitral stenosis.  ? 4. The aortic valve is tricuspid. Aortic valve regurgitation is not  ?visualized. No aortic stenosis is present.  ? 5. The inferior vena cava is normal in size with greater than 50%  ?respiratory variability, suggesting right atrial pressure of 3 mmHg.  ? ?TTE 12/13/17  ?- Left ventricle: The cavity size was normal. Systolic function was ?  normal. The estimated ejection fraction was in the range of 50% ?  to 55%. Images were inadequate for LV  wall motion assessment. ?  Left ventricular diastolic function parameters were normal. ?- Tricuspid valve: There was trivial regurgitation. ?- Recommendations: Suggest limited study with defininty contrast t

## 2022-01-15 ENCOUNTER — Ambulatory Visit: Payer: BC Managed Care – PPO | Admitting: Physician Assistant

## 2022-01-15 ENCOUNTER — Encounter: Payer: Self-pay | Admitting: *Deleted

## 2022-01-15 ENCOUNTER — Encounter: Payer: Self-pay | Admitting: Physician Assistant

## 2022-01-15 VITALS — BP 108/78 | HR 66 | Ht 66.0 in | Wt 281.0 lb

## 2022-01-15 DIAGNOSIS — R079 Chest pain, unspecified: Secondary | ICD-10-CM

## 2022-01-15 DIAGNOSIS — I480 Paroxysmal atrial fibrillation: Secondary | ICD-10-CM | POA: Diagnosis not present

## 2022-01-15 NOTE — Patient Instructions (Addendum)
Medication Instructions:  ? ?Your physician recommends that you continue on your current medications as directed. Please refer to the Current Medication list given to you today. ? ?*If you need a refill on your cardiac medications before your next appointment, please call your pharmacy* ? ? ?Lab Work: [place] ? ? ?If you have labs (blood work) drawn today and your tests are completely normal, you will receive your results only by: ?MyChart Message (if you have MyChart) OR ?A paper copy in the mail ?If you have any lab test that is abnormal or we need to change your treatment, we will call you to review the results. ? ? ?Testing/Procedures: Your physician has requested that you have en exercise stress myoview. For further information please visit [email or web address]. Please follow instruction sheet, as given. ? ? ?Follow-Up: ?At [facility], you and your health needs are our priority.  As part of our continuing mission to provide you with exceptional heart care, we have created designated Provider Care Teams.  These Care Teams include your primary Cardiologist (physician) and Advanced Practice Providers (APPs -  Physician Assistants and Nurse Practitioners) who all work together to provide you with the care you need, when you need it. ? ?We recommend signing up for the patient portal called "MyChart".  Sign up information is provided on this After Visit Summary.  MyChart is used to connect with patients for Virtual Visits (Telemedicine).  Patients are able to view lab/test results, encounter notes, upcoming appointments, etc.  Non-urgent messages can be sent to your provider as well.   ?To learn more about what you can do with MyChart, go to [email or web address].   ? ?Your next appointment:   ?3-4 Months ? ?The format for your next appointment:   ?In Person ? ?Provider:   ?You may see Will Meredith Leeds, MD or one of the following Advanced Practice Providers on your designated Care Team:   ?Tommye Standard, PA-C ? ? ?If primary card or EP is not listed click here to update    :1}  ? ? ?Other Instructions ? ? ?Important Information About Sugar ? ? ? ? ?  ?

## 2022-02-01 DIAGNOSIS — E119 Type 2 diabetes mellitus without complications: Secondary | ICD-10-CM | POA: Diagnosis not present

## 2022-02-20 DIAGNOSIS — M9902 Segmental and somatic dysfunction of thoracic region: Secondary | ICD-10-CM | POA: Diagnosis not present

## 2022-02-20 DIAGNOSIS — M9906 Segmental and somatic dysfunction of lower extremity: Secondary | ICD-10-CM | POA: Diagnosis not present

## 2022-02-20 DIAGNOSIS — M9905 Segmental and somatic dysfunction of pelvic region: Secondary | ICD-10-CM | POA: Diagnosis not present

## 2022-02-20 DIAGNOSIS — M9903 Segmental and somatic dysfunction of lumbar region: Secondary | ICD-10-CM | POA: Diagnosis not present

## 2022-02-20 DIAGNOSIS — M9901 Segmental and somatic dysfunction of cervical region: Secondary | ICD-10-CM | POA: Diagnosis not present

## 2022-03-01 ENCOUNTER — Telehealth (HOSPITAL_COMMUNITY): Payer: Self-pay | Admitting: *Deleted

## 2022-03-01 NOTE — Telephone Encounter (Signed)
Patient given detailed instructions per Myocardial Perfusion Study Information Sheet for the test on 03/08/22 Patient notified to arrive 15 minutes early and that it is imperative to arrive on time for appointment to keep from having the test rescheduled.  If you need to cancel or reschedule your appointment, please call the office within 24 hours of your appointment. . Patient verbalized understanding. Kirstie Peri

## 2022-03-03 DIAGNOSIS — E119 Type 2 diabetes mellitus without complications: Secondary | ICD-10-CM | POA: Diagnosis not present

## 2022-03-08 ENCOUNTER — Ambulatory Visit (HOSPITAL_COMMUNITY): Payer: BC Managed Care – PPO | Attending: Cardiology

## 2022-03-08 DIAGNOSIS — M9902 Segmental and somatic dysfunction of thoracic region: Secondary | ICD-10-CM | POA: Diagnosis not present

## 2022-03-08 DIAGNOSIS — M9905 Segmental and somatic dysfunction of pelvic region: Secondary | ICD-10-CM | POA: Diagnosis not present

## 2022-03-08 DIAGNOSIS — R079 Chest pain, unspecified: Secondary | ICD-10-CM | POA: Diagnosis not present

## 2022-03-08 DIAGNOSIS — M9906 Segmental and somatic dysfunction of lower extremity: Secondary | ICD-10-CM | POA: Diagnosis not present

## 2022-03-08 DIAGNOSIS — M9903 Segmental and somatic dysfunction of lumbar region: Secondary | ICD-10-CM | POA: Diagnosis not present

## 2022-03-08 DIAGNOSIS — M9901 Segmental and somatic dysfunction of cervical region: Secondary | ICD-10-CM | POA: Diagnosis not present

## 2022-03-08 MED ORDER — REGADENOSON 0.4 MG/5ML IV SOLN
0.4000 mg | Freq: Once | INTRAVENOUS | Status: AC
  Administered 2022-03-08: 0.4 mg via INTRAVENOUS

## 2022-03-08 MED ORDER — TECHNETIUM TC 99M TETROFOSMIN IV KIT
30.8000 | PACK | Freq: Once | INTRAVENOUS | Status: AC | PRN
  Administered 2022-03-08: 30.8 via INTRAVENOUS

## 2022-03-09 ENCOUNTER — Ambulatory Visit (HOSPITAL_COMMUNITY): Payer: BC Managed Care – PPO | Attending: Cardiovascular Disease

## 2022-03-09 LAB — MYOCARDIAL PERFUSION IMAGING
LV dias vol: 107 mL (ref 46–106)
LV sys vol: 45 mL
Nuc Stress EF: 58 %
Peak HR: 104 {beats}/min
Rest HR: 60 {beats}/min
Rest Nuclear Isotope Dose: 31.1 mCi
SDS: 2
SRS: 0
SSS: 2
ST Depression (mm): 0 mm
Stress Nuclear Isotope Dose: 30.8 mCi
TID: 0.95

## 2022-03-09 MED ORDER — TECHNETIUM TC 99M TETROFOSMIN IV KIT
31.1000 | PACK | Freq: Once | INTRAVENOUS | Status: AC | PRN
  Administered 2022-03-09: 31.1 via INTRAVENOUS

## 2022-03-12 ENCOUNTER — Telehealth: Payer: Self-pay | Admitting: Cardiology

## 2022-03-12 NOTE — Telephone Encounter (Signed)
Patient is calling stating she has been consistently having a headache since Thursday 07/13. She states she does not have headaches and would like a nurse to call her concerning this.

## 2022-03-12 NOTE — Telephone Encounter (Signed)
Returned call to Pt.  She states she has had a headache since her stress test last week.  Discussed with stress tech-advised medications given during test have a short half life and would have been out of her system by the end of the day.  Advised Pt of this.  She states her BP has been ok.  Advised Pt to follow up with PCP regarding heachache.

## 2022-03-14 DIAGNOSIS — G4733 Obstructive sleep apnea (adult) (pediatric): Secondary | ICD-10-CM | POA: Diagnosis not present

## 2022-03-23 DIAGNOSIS — F32A Depression, unspecified: Secondary | ICD-10-CM | POA: Diagnosis not present

## 2022-03-23 DIAGNOSIS — R5383 Other fatigue: Secondary | ICD-10-CM | POA: Diagnosis not present

## 2022-03-23 DIAGNOSIS — E559 Vitamin D deficiency, unspecified: Secondary | ICD-10-CM | POA: Diagnosis not present

## 2022-03-23 DIAGNOSIS — Z1589 Genetic susceptibility to other disease: Secondary | ICD-10-CM | POA: Diagnosis not present

## 2022-03-23 DIAGNOSIS — E039 Hypothyroidism, unspecified: Secondary | ICD-10-CM | POA: Diagnosis not present

## 2022-03-23 DIAGNOSIS — E119 Type 2 diabetes mellitus without complications: Secondary | ICD-10-CM | POA: Diagnosis not present

## 2022-03-23 DIAGNOSIS — E7211 Homocystinuria: Secondary | ICD-10-CM | POA: Diagnosis not present

## 2022-03-23 DIAGNOSIS — R3589 Other polyuria: Secondary | ICD-10-CM | POA: Diagnosis not present

## 2022-03-23 DIAGNOSIS — E282 Polycystic ovarian syndrome: Secondary | ICD-10-CM | POA: Diagnosis not present

## 2022-03-23 DIAGNOSIS — E538 Deficiency of other specified B group vitamins: Secondary | ICD-10-CM | POA: Diagnosis not present

## 2022-03-23 DIAGNOSIS — Z1329 Encounter for screening for other suspected endocrine disorder: Secondary | ICD-10-CM | POA: Diagnosis not present

## 2022-04-03 DIAGNOSIS — E119 Type 2 diabetes mellitus without complications: Secondary | ICD-10-CM | POA: Diagnosis not present

## 2022-04-18 ENCOUNTER — Ambulatory Visit: Payer: BC Managed Care – PPO | Admitting: Family Medicine

## 2022-04-18 VITALS — BP 135/79 | Ht 66.0 in | Wt 285.0 lb

## 2022-04-18 DIAGNOSIS — E282 Polycystic ovarian syndrome: Secondary | ICD-10-CM | POA: Diagnosis not present

## 2022-04-18 DIAGNOSIS — M25562 Pain in left knee: Secondary | ICD-10-CM | POA: Diagnosis not present

## 2022-04-18 DIAGNOSIS — E039 Hypothyroidism, unspecified: Secondary | ICD-10-CM | POA: Diagnosis not present

## 2022-04-18 DIAGNOSIS — M9902 Segmental and somatic dysfunction of thoracic region: Secondary | ICD-10-CM | POA: Diagnosis not present

## 2022-04-18 DIAGNOSIS — R5383 Other fatigue: Secondary | ICD-10-CM | POA: Diagnosis not present

## 2022-04-18 DIAGNOSIS — M9901 Segmental and somatic dysfunction of cervical region: Secondary | ICD-10-CM | POA: Diagnosis not present

## 2022-04-18 DIAGNOSIS — R7303 Prediabetes: Secondary | ICD-10-CM | POA: Diagnosis not present

## 2022-04-18 DIAGNOSIS — M9906 Segmental and somatic dysfunction of lower extremity: Secondary | ICD-10-CM | POA: Diagnosis not present

## 2022-04-18 DIAGNOSIS — M9903 Segmental and somatic dysfunction of lumbar region: Secondary | ICD-10-CM | POA: Diagnosis not present

## 2022-04-18 DIAGNOSIS — M9905 Segmental and somatic dysfunction of pelvic region: Secondary | ICD-10-CM | POA: Diagnosis not present

## 2022-04-18 MED ORDER — METHYLPREDNISOLONE ACETATE 40 MG/ML IJ SUSP
40.0000 mg | Freq: Once | INTRAMUSCULAR | Status: AC
  Administered 2022-04-18: 40 mg via INTRA_ARTICULAR

## 2022-04-18 NOTE — Patient Instructions (Signed)
Your pain is due to a flare of arthritis. These are the different medications you can take for this: Tylenol '500mg'$  1-2 tabs three times a day for pain. Capsaicin, aspercreme, or biofreeze topically up to four times a day may also help with pain. Some supplements that may help for arthritis: Boswellia extract, curcumin, pycnogenol Aleve 1-2 tabs twice a day with food if needed. Cortisone injections are an option - you were given this today. It's important that you continue to stay active. Straight leg raises, knee extensions 3 sets of 10 once a day (add ankle weight if these become too easy). Consider physical therapy to strengthen muscles around the joint that hurts to take pressure off of the joint itself. Shoe inserts with good arch support may be helpful. Heat or ice 15 minutes at a time 3-4 times a day as needed to help with pain. Follow up with me in 6 weeks - if not improving would go ahead with imaging.   If you want to go ahead with custom orthotics just let the front office know and we can schedule - it takes 40-45 minutes but you leave with them.

## 2022-04-18 NOTE — Progress Notes (Unsigned)
  Desiree Schultz - 51 y.o. female MRN [id number]  Date of birth: 1970-12-10    CHIEF COMPLAINT:   L knee pain    SUBJECTIVE:   HPI: Patient presents to clinic for evaluation of left knee pain.  Her knee has been bothering her for about 1 month.  She describes the knee as " crunchy, catching, and locking".  Her pain is most significant over the medial aspect of the knee.  Her pain is exacerbated by walking.  She has been taking Celebrex occasionally but has not gotten much pain relief.  Denies any injury or trauma to the knee.  Denies any radiation of pain down the leg.  Of note, she does have history of meniscus surgery on the left knee about 4 years ago.  Patient wonders if her pain with walking is due to worn-out orthotics.  She has old orthotics but is interested in getting custom ones.  ROS:     See HPI  PERTINENT  PMH / PSH FH / / SH:  Past Medical, Surgical, Social, and Family History Reviewed & Updated in the EMR.  Pertinent findings include:  none  OBJECTIVE: BP 135/79   Ht '5\' 6"'$  (1.676 m)   Wt 285 lb (129.3 kg)   LMP 07/22/2019   BMI 46.00 kg/m   Physical Exam:  Vital signs are reviewed.  GEN: Alert and oriented, NAD Pulm: Breathing unlabored PSY: normal mood, congruent affect  MSK: L Knee -no obvious effusion or overlying erythema.  Tender to palpation medial joint line.  Nontender palpation over the lateral joint line.  Full range of motion flexion extension.  She has no ligamentous instability with valgus or varus testing.  She has negative Lachman's.  She has a mildly positive Thessaly test that she describes as " uncomfortable".   ASSESSMENT & PLAN:  1. L Knee Pain  -Likely due to a flare of arthritis accelerated by her history of meniscus injury/surgery.  We discussed treatment options today.  She would like to proceed with a ultrasound-guided corticosteroid injection in the left knee today.  See procedure note for full details.  She will perform 6 weeks of physical  therapy and try both oral and topical NSAIDs at home.  If no improvement after 6 weeks, next step would be ordering MRI of left knee to evaluate medial meniscus.   Procedure Note L knee intraarticular corticosteroid injection; US guided  Consent obtained and verified. Noted no overlying erythema, induration, or other signs of local infection. The suprapatellar space was visualized under ultrasound guidance in the long axis, palpated and marked. The overlying skin was prepped in a sterile fashion. Topical analgesic spray: Ethyl chloride. Joint: L knee Needle: 25 gauge, 1.5 inch Completed without difficulty. Meds: 40 mg methylrprednisolone, 3 ml 1% lidocaine without epinephrine  Advised to call if fevers/chills, erythema, induration, drainage, or persistent bleeding.   She can follow-up in 6 weeks if not improving. She can also make a separate appointment for custom orthotics but she will call her insurance to see if this is covered first    Dortha Kern, MD PGY-4, Sports Medicine Fellow [place]

## 2022-04-19 ENCOUNTER — Encounter: Payer: Self-pay | Admitting: Family Medicine

## 2022-05-04 DIAGNOSIS — E119 Type 2 diabetes mellitus without complications: Secondary | ICD-10-CM | POA: Diagnosis not present

## 2022-05-25 ENCOUNTER — Ambulatory Visit: Payer: BC Managed Care – PPO | Admitting: Podiatry

## 2022-05-25 ENCOUNTER — Ambulatory Visit (INDEPENDENT_AMBULATORY_CARE_PROVIDER_SITE_OTHER): Payer: BC Managed Care – PPO

## 2022-05-25 DIAGNOSIS — M722 Plantar fascial fibromatosis: Secondary | ICD-10-CM | POA: Diagnosis not present

## 2022-05-25 NOTE — Progress Notes (Signed)
Chief Complaint  Patient presents with   Foot Pain    Patient is here for left foot pain, she states that the arch has fallen and causing knee pain also she has toe nail fungus on both great toes, she has taken Lamisil for 3 months and still present.    HPI: 51 y.o. female presenting today as a new patient for evaluation of pain with fallen arch to the left foot.  Patient has noticed this over the past year developing.  She is concerned.  She also is concerned for discoloration to the distal tips of the bilateral great toes.  She gets routine pedicures and she states that she is concerned for possible fungus.  She has also completed Lamisil 200 mg 90 days worth.  Past Medical History:  Diagnosis Date   Allergic rhinitis    Anemia    Atrial fibrillation ([place])    2006   Bronchial breathing    Chronic anxiety    Depression    Diabetes mellitus without complication ([place])    Dysrhythmia    Elevated blood pressure    On therapy. Probably hypertension   Fibroid    Hx of cardiovascular stress test    a. Myoview 10/13:  no ischemia; EF 57%   Hypothyroid    Menorrhagia    2011   Obesity (BMI 30-39.9)    OSA (obstructive sleep apnea)    on CPAP   PCOS (polycystic ovarian syndrome)    PONV (postoperative nausea and vomiting)    C/o motion sickness   Prediabetes    Vitamin D deficiency     Past Surgical History:  Procedure Laterality Date   ATRIAL FIBRILLATION ABLATION N/A 01/17/2018   Procedure: ATRIAL FIBRILLATION ABLATION;  Surgeon: Constance Haw, MD;  Location: [facility];  Service: Cardiovascular;  Laterality: N/A;   BLADDER SURGERY  1997   interstitial cystitis    BREAST REDUCTION SURGERY  06/2013   BREAST SURGERY     reduction   DILATION AND CURETTAGE, DIAGNOSTIC / THERAPEUTIC     ENDOMETRIAL ABLATION     ENDOMETRIAL ABLATION  08/2010   HAND SURGERY  2006   due to cat bite   KNEE ARTHROSCOPY WITH MEDIAL MENISECTOMY Left 04/06/2015   Procedure: LEFT KNEE  ARTHROSCOPY WITH PARTIAL MEDIAL MENISCECTOMY;  Surgeon: Leandrew Koyanagi, MD;  Location: [facility];  Service: Orthopedics;  Laterality: Left;   OVARIAN CYST REMOVAL  04/28/13   REDUCTION MAMMAPLASTY Bilateral 2014   RENAL BIOPSY, OPEN  2006   TONSILLECTOMY  2007    Allergies  Allergen Reactions   Multaq [Dronedarone] Swelling    Per patient   Benadryl [Diphenhydramine Hcl] Itching   Diphenhydramine Itching   Fish Oil     Makes hands raw, mouth and female area  Other reaction(s): Other   Influenza Vaccines     Gives pt the flu and site of injection swells and hot   Influenza Virus Vaccine     Other reaction(s): Other   Latex     Burns skin   Oxycodone Nausea And Vomiting   Pedi-Pre Tape Spray [Wound Dressing Adhesive] Itching    Burns skin, band-aid, ekg stickers  Burns skin, band-aid, ekg stickers   Shellfish Allergy     Other reaction(s): Contact Dermatitis (intolerance)   Shrimp Extract Allergy Skin Test    Tape     Burns skin, band-aid, ekg stickers   Sulfa Antibiotics Rash    Other reaction(s): Other  Physical Exam: General: The patient is alert and oriented x3 in no acute distress.  Dermatology: Skin is warm, dry and supple bilateral lower extremities. Negative for open lesions or macerations.  Very mild hyperkeratotic nail plate noted to the distal tip of the bilateral great toes with some slight discoloration  Vascular: Palpable pedal pulses bilaterally. Capillary refill within normal limits.  Negative for any significant edema or erythema  Neurological: Light touch and protective threshold grossly intact  Musculoskeletal Exam: With bearing there is collapse of the medial longitudinal arch of the left foot compared to the contralateral limb.  There is some mild tenderness to palpation along the plantar heel.  Radiographic Exam B/L feet 05/25/2022:  Normal osseous mineralization. Joint spaces preserved. No fracture/dislocation/boney destruction.    Assessment: 1.   Left foot arch collapse, pes planovalgus, with weightbearing  2.  Plantar fasciitis left 3.  Dystrophic nail distal tips of the bilateral great toes   Plan of Care:  1. Patient evaluated. X-Rays reviewed.  2.  OTC topical Tolcylen antifungal dispensed at checkout to apply daily.  Patient has completed Lamisil 90 days with no improvement.  We will try the topical antifungal/rejuvenation 3.  Today we discussed the patient's arch collapse which is the most concerning for the patient.  Recommended custom molded orthotics.  The patient was molded today for custom molded orthotics 4.  Return to clinic for orthotics pickup      Edrick Kins, DPM [facility]  Dr. Edrick Kins, DPM    [address], [place] [address]                Office [phone]  Fax [phone]

## 2022-05-31 DIAGNOSIS — M9901 Segmental and somatic dysfunction of cervical region: Secondary | ICD-10-CM | POA: Diagnosis not present

## 2022-05-31 DIAGNOSIS — M9903 Segmental and somatic dysfunction of lumbar region: Secondary | ICD-10-CM | POA: Diagnosis not present

## 2022-05-31 DIAGNOSIS — M9905 Segmental and somatic dysfunction of pelvic region: Secondary | ICD-10-CM | POA: Diagnosis not present

## 2022-05-31 DIAGNOSIS — M9902 Segmental and somatic dysfunction of thoracic region: Secondary | ICD-10-CM | POA: Diagnosis not present

## 2022-05-31 DIAGNOSIS — M9906 Segmental and somatic dysfunction of lower extremity: Secondary | ICD-10-CM | POA: Diagnosis not present

## 2022-06-03 DIAGNOSIS — E119 Type 2 diabetes mellitus without complications: Secondary | ICD-10-CM | POA: Diagnosis not present

## 2022-06-12 DIAGNOSIS — M9905 Segmental and somatic dysfunction of pelvic region: Secondary | ICD-10-CM | POA: Diagnosis not present

## 2022-06-12 DIAGNOSIS — M9901 Segmental and somatic dysfunction of cervical region: Secondary | ICD-10-CM | POA: Diagnosis not present

## 2022-06-12 DIAGNOSIS — M9903 Segmental and somatic dysfunction of lumbar region: Secondary | ICD-10-CM | POA: Diagnosis not present

## 2022-06-12 DIAGNOSIS — M9902 Segmental and somatic dysfunction of thoracic region: Secondary | ICD-10-CM | POA: Diagnosis not present

## 2022-06-12 DIAGNOSIS — M9906 Segmental and somatic dysfunction of lower extremity: Secondary | ICD-10-CM | POA: Diagnosis not present

## 2022-06-13 ENCOUNTER — Telehealth: Payer: Self-pay | Admitting: Podiatry

## 2022-06-13 NOTE — Telephone Encounter (Signed)
Spoke w/ pt about her orthotics, advised that they could take anywhere from 4/6 weeks to come in. Told her to call in on 10/20 or 10/23 to see if they have come in yet

## 2022-06-19 ENCOUNTER — Ambulatory Visit: Payer: BC Managed Care – PPO | Admitting: Cardiology

## 2022-06-22 ENCOUNTER — Ambulatory Visit: Payer: BC Managed Care – PPO | Admitting: Cardiology

## 2022-06-22 DIAGNOSIS — E039 Hypothyroidism, unspecified: Secondary | ICD-10-CM | POA: Diagnosis not present

## 2022-06-22 DIAGNOSIS — G4730 Sleep apnea, unspecified: Secondary | ICD-10-CM | POA: Diagnosis not present

## 2022-06-22 DIAGNOSIS — F32A Depression, unspecified: Secondary | ICD-10-CM | POA: Diagnosis not present

## 2022-06-22 DIAGNOSIS — R5383 Other fatigue: Secondary | ICD-10-CM | POA: Diagnosis not present

## 2022-06-28 DIAGNOSIS — M9905 Segmental and somatic dysfunction of pelvic region: Secondary | ICD-10-CM | POA: Diagnosis not present

## 2022-06-28 DIAGNOSIS — M9902 Segmental and somatic dysfunction of thoracic region: Secondary | ICD-10-CM | POA: Diagnosis not present

## 2022-06-28 DIAGNOSIS — M9901 Segmental and somatic dysfunction of cervical region: Secondary | ICD-10-CM | POA: Diagnosis not present

## 2022-06-28 DIAGNOSIS — M9903 Segmental and somatic dysfunction of lumbar region: Secondary | ICD-10-CM | POA: Diagnosis not present

## 2022-06-28 DIAGNOSIS — M9906 Segmental and somatic dysfunction of lower extremity: Secondary | ICD-10-CM | POA: Diagnosis not present

## 2022-07-04 DIAGNOSIS — E119 Type 2 diabetes mellitus without complications: Secondary | ICD-10-CM | POA: Diagnosis not present

## 2022-07-05 NOTE — Progress Notes (Signed)
PCP:  Dian Queen, MD Primary Cardiologist: Virl Axe, MD Electrophysiologist: Constance Haw, MD   Desiree Schultz is a 51 y.o. female seen today for Will Meredith Leeds, MD for routine electrophysiology followup. Since last being seen in our clinic the patient reports doing about the same. She has several non-specific global symptoms.  She has atypical chest discomfort several times a week. Normal echo, myoview, and Ca score of 0 as below. She also states her mom and Grandmother have PAD, and she has begun to have early leg fatigue, calf pain, and foot pain. She does have diabetes but reports it is well controlled. She has rare, brief palpitations, but states it is different from her AF. she denies dyspnea, PND, orthopnea, nausea, vomiting, dizziness, syncope, edema, weight gain, or early satiety.   Past Medical History:  Diagnosis Date   Allergic rhinitis    Anemia    Atrial fibrillation ([place])    2006   Bronchial breathing    Chronic anxiety    Depression    Diabetes mellitus without complication ([place])    Dysrhythmia    Elevated blood pressure    On therapy. Probably hypertension   Fibroid    Hx of cardiovascular stress test    a. Myoview 10/13:  no ischemia; EF 57%   Hypothyroid    Menorrhagia    2011   Obesity (BMI 30-39.9)    OSA (obstructive sleep apnea)    on CPAP   PCOS (polycystic ovarian syndrome)    PONV (postoperative nausea and vomiting)    C/o motion sickness   Prediabetes    Vitamin D deficiency    Past Surgical History:  Procedure Laterality Date   ATRIAL FIBRILLATION ABLATION N/A 01/17/2018   Procedure: ATRIAL FIBRILLATION ABLATION;  Surgeon: Constance Haw, MD;  Location: [facility];  Service: Cardiovascular;  Laterality: N/A;   BLADDER SURGERY  1997   interstitial cystitis    BREAST REDUCTION SURGERY  06/2013   BREAST SURGERY     reduction   DILATION AND CURETTAGE, DIAGNOSTIC / THERAPEUTIC     ENDOMETRIAL ABLATION      ENDOMETRIAL ABLATION  08/2010   HAND SURGERY  2006   due to cat bite   KNEE ARTHROSCOPY WITH MEDIAL MENISECTOMY Left 04/06/2015   Procedure: LEFT KNEE ARTHROSCOPY WITH PARTIAL MEDIAL MENISCECTOMY;  Surgeon: Leandrew Koyanagi, MD;  Location: [facility];  Service: Orthopedics;  Laterality: Left;   OVARIAN CYST REMOVAL  04/28/13   REDUCTION MAMMAPLASTY Bilateral 2014   RENAL BIOPSY, OPEN  2006   TONSILLECTOMY  2007    Current Outpatient Medications  Medication Sig Dispense Refill   ascorbic acid (VITAMIN C) 1000 MG tablet Take 1 tablet by mouth daily.     Blood Glucose Monitoring Suppl (FIFTY50 GLUCOSE METER 2.0) w/Device KIT Test as needed     cetirizine (ZYRTEC) 10 MG tablet Take 1 tablet (10 mg total) by mouth daily. 30 tablet 11   clotrimazole (LOTRIMIN) 1 % cream Apply topically daily in the afternoon.     escitalopram (LEXAPRO) 20 MG tablet Take 1 tablet (20 mg total) by mouth daily. 30 tablet 11   ipratropium (ATROVENT) 0.06 % nasal spray USE 2 SPRAYS IN BOTH  NOSTRILS 3 TIMES DAILY     levothyroxine (SYNTHROID) 137 MCG tablet Take by mouth.     liothyronine (CYTOMEL) 5 MCG tablet Take by mouth.     spironolactone (ALDACTONE) 50 MG tablet Take 1 tablet by mouth daily.  No current facility-administered medications for this visit.    Allergies  Allergen Reactions   Multaq [Dronedarone] Swelling    Per patient   Benadryl [Diphenhydramine Hcl] Itching   Diphenhydramine Itching   Fish Oil     Makes hands raw, mouth and female area  Other reaction(s): Other   Influenza Vaccines     Gives pt the flu and site of injection swells and hot   Influenza Virus Vaccine     Other reaction(s): Other   Latex     Burns skin   Oxycodone Nausea And Vomiting   Pedi-Pre Tape Spray [Wound Dressing Adhesive] Itching    Burns skin, band-aid, ekg stickers  Burns skin, band-aid, ekg stickers   Shellfish Allergy     Other reaction(s): Contact Dermatitis (intolerance)   Shrimp Extract Allergy Skin Test     Tape     Burns skin, band-aid, ekg stickers   Sulfa Antibiotics Rash    Other reaction(s): Other    Social History   Socioeconomic History   Marital status: Significant Other    Spouse name: Christell Faith   Number of children: Not on file   Years of education: associates in IT   Highest education level: Not on file  Occupational History   Occupation: Psychologist, educational: [employer]  Tobacco Use   Smoking status: Former    Packs/day: 2.00    Years: 16.00    Total pack years: 32.00    Types: Cigarettes    Quit date: 09/03/1998    Years since quitting: 23.8   Smokeless tobacco: Never  Vaping Use   Vaping Use: Never used  Substance and Sexual Activity   Alcohol use: Yes    Comment: maybe 2 a mth   Drug use: Yes    Types: Marijuana    Comment: occasional   Sexual activity: Yes  Other Topics Concern   Not on file  Social History Narrative   Long term partner, Ortencia Kick from [place]   Has lived in [place] since age 84 yo.   Social Determinants of Health   Financial Resource Strain: Not on file  Food Insecurity: Not on file  Transportation Needs: Not on file  Physical Activity: Not on file  Stress: Not on file  Social Connections: Not on file  Intimate Partner Violence: Not on file     Review of Systems: All other systems reviewed and are otherwise negative except as noted above.  Physical Exam: Vitals:   07/10/22 0822  Weight: 277 lb (125.6 kg)  Height: _0  (1.676 m)    GEN- The patient is well appearing, alert and oriented x 3 today.   HEENT: normocephalic, atraumatic; sclera clear, conjunctiva pink; hearing intact; oropharynx clear; neck supple, no JVP Lymph- no cervical lymphadenopathy Lungs- Clear to ausculation bilaterally, normal work of breathing.  No wheezes, rales, rhonchi Heart- Regular rate and rhythm, no murmurs, rubs or gallops, PMI not laterally displaced GI- soft,  non-tender, non-distended, bowel sounds present, no hepatosplenomegaly Extremities- No peripheral edema. no clubbing or cyanosis; DP/PT/radial pulses 2+ bilaterally MS- no significant deformity or atrophy Skin- warm and dry, no rash or lesion Psych- euthymic mood, full affect Neuro- strength and sensation are intact  EKG is ordered. Personal review of EKG from today shows NSR at 60 bpm  Additional studies reviewed include: Previous EP notes.   GATED SPECT MYO PERF W/LEXISCAN STRESS 1D 03/09/2022 Narrative   The study  is normal. The study is low risk.   No ST deviation was noted.   LV perfusion is normal.   Left ventricular function is normal. Nuclear stress EF: 58 %. The left ventricular ejection fraction is normal (55-65%). End diastolic cavity size is normal. End systolic cavity size is mildly enlarged.   Prior study available for comparison from 06/09/2012. No changes compared to prior study. Normal perfusion, LVEF 57%  Normal perfusion. LVEF 58% with normal wall motion. This is a low risk study. No change compared to prior study in 2013.   ECHO COMPLETE WO IMAGING ENHANCING AGENT 09/23/2020 1. Left ventricular ejection fraction, by estimation, is 55 to 60%. The left ventricle has normal function. The left ventricle has no regional wall motion abnormalities. Left ventricular diastolic parameters were normal. 2. Right ventricular systolic function is normal. The right ventricular size is normal. 3. The mitral valve is normal in structure. Trivial mitral valve regurgitation. No evidence of mitral stenosis. 4. The aortic valve is tricuspid. Aortic valve regurgitation is not visualized. No aortic stenosis is present. 5. The inferior vena cava is normal in size with greater than 50% respiratory variability, suggesting right atrial pressure of 3 mmHg.  Assessment and Plan:  1. PAF EKG today shows NSR Not on [place] with CHA2DS2VASc of 2 Rare palpitations, but feel different and shorter than  when she had AF  2. Atypical chest pain Normal myoview 03/09/2022 Calcium score of 0 several years ago.   3. ? Claudication ? PVD She describes early leg fatigue, calf and foot pain.  Will order ABIs.  Her sugars have been historically well controlled, so not sure if neuropathy could be contributing.  Follow up with Dr. Curt Bears in 6 months. With appropriate team sooner if ABIs positive.   Shirley Friar, PA-C  07/10/22 8:29 AM

## 2022-07-10 ENCOUNTER — Encounter: Payer: Self-pay | Admitting: Podiatry

## 2022-07-10 ENCOUNTER — Ambulatory Visit (INDEPENDENT_AMBULATORY_CARE_PROVIDER_SITE_OTHER): Payer: BC Managed Care – PPO | Admitting: Podiatry

## 2022-07-10 ENCOUNTER — Encounter: Payer: Self-pay | Admitting: Student

## 2022-07-10 ENCOUNTER — Ambulatory Visit: Payer: BC Managed Care – PPO | Attending: Cardiology | Admitting: Student

## 2022-07-10 VITALS — BP 126/76 | HR 60 | Ht 66.0 in | Wt 277.0 lb

## 2022-07-10 DIAGNOSIS — M722 Plantar fascial fibromatosis: Secondary | ICD-10-CM

## 2022-07-10 DIAGNOSIS — M9903 Segmental and somatic dysfunction of lumbar region: Secondary | ICD-10-CM | POA: Diagnosis not present

## 2022-07-10 DIAGNOSIS — R079 Chest pain, unspecified: Secondary | ICD-10-CM | POA: Diagnosis not present

## 2022-07-10 DIAGNOSIS — I739 Peripheral vascular disease, unspecified: Secondary | ICD-10-CM | POA: Diagnosis not present

## 2022-07-10 DIAGNOSIS — M9901 Segmental and somatic dysfunction of cervical region: Secondary | ICD-10-CM | POA: Diagnosis not present

## 2022-07-10 DIAGNOSIS — I480 Paroxysmal atrial fibrillation: Secondary | ICD-10-CM

## 2022-07-10 DIAGNOSIS — M9902 Segmental and somatic dysfunction of thoracic region: Secondary | ICD-10-CM | POA: Diagnosis not present

## 2022-07-10 DIAGNOSIS — M9906 Segmental and somatic dysfunction of lower extremity: Secondary | ICD-10-CM | POA: Diagnosis not present

## 2022-07-10 DIAGNOSIS — M9905 Segmental and somatic dysfunction of pelvic region: Secondary | ICD-10-CM | POA: Diagnosis not present

## 2022-07-10 NOTE — Telephone Encounter (Signed)
Pt called to check status of orthotics. I checked with Christan and the orthotics were shipped on 06/12/22 and taken to the [place] office shortly after arrival. Pt is aware that the orthotics are now available for pickup in [place].

## 2022-07-10 NOTE — Addendum Note (Signed)
Addended by: Carylon Perches on: 07/10/2022 09:02 AM   Modules accepted: Orders

## 2022-07-10 NOTE — Telephone Encounter (Signed)
Patient presented to the [place] office for orthotics pick up. While dispensing the patient noticed that the name on the sticker was another patients name but the name on the paperwork is correct. After speaking with the lab I was reassured that the wrong name was selected when printing labels on the vendors end but because the inserts don't fit we will be sending them back to have the process started. Called and spoke with the patient and she is aware that we will be starting the process over and we will contact her when they come in. I advised her that we would put a rush on it. Pt was appreciative of our efforts to correct the error.

## 2022-07-10 NOTE — Patient Instructions (Signed)
Medication Instructions:  Your physician recommends that you continue on your current medications as directed. Please refer to the Current Medication list given to you today.  *If you need a refill on your cardiac medications before your next appointment, please call your pharmacy*   Lab Work: None If you have labs (blood work) drawn today and your tests are completely normal, you will receive your results only by: [place] (if you have MyChart) OR A paper copy in the mail If you have any lab test that is abnormal or we need to change your treatment, we will call you to review the results.   Testing/Procedures: Your physician has requested that you have an ankle brachial index (ABI). During this test an ultrasound and blood pressure cuff are used to evaluate the arteries that supply the arms and legs with blood. Allow thirty minutes for this exam. There are no restrictions or special instructions.   Follow-Up: At [facility], you and your health needs are our priority.  As part of our continuing mission to provide you with exceptional heart care, we have created designated Provider Care Teams.  These Care Teams include your primary Cardiologist (physician) and Advanced Practice Providers (APPs -  Physician Assistants and Nurse Practitioners) who all work together to provide you with the care you need, when you need it.   Your next appointment:   6 month(s)  The format for your next appointment:   In Person  Provider:   Allegra Lai, MD     Important Information About Sugar

## 2022-07-10 NOTE — Patient Instructions (Signed)

## 2022-07-10 NOTE — Progress Notes (Signed)
  Subjective:     Patient ID: Desiree Schultz, female   DOB: 1970/10/12, 51 y.o.   MRN: [id number]  Patient presents to pick up orthotics.  The orthotics did not fit her.  We're going to send them back to the company.  I gave her a pair of powerstep inserts because she was very upset.  She said her feet were killing her and that she has been waiting on these orthotics for months.  I apologized for the inconvenience.  I told her we would call her once the orthotics come in.  She thanked me for helping her.   Review of Systems     Objective:   Physical Exam     Assessment:         Plan:

## 2022-07-10 NOTE — Addendum Note (Signed)
Addended by: Carylon Perches on: 07/10/2022 08:57 AM   Modules accepted: Orders

## 2022-07-18 DIAGNOSIS — R5383 Other fatigue: Secondary | ICD-10-CM | POA: Diagnosis not present

## 2022-07-18 DIAGNOSIS — E063 Autoimmune thyroiditis: Secondary | ICD-10-CM | POA: Diagnosis not present

## 2022-07-18 DIAGNOSIS — E282 Polycystic ovarian syndrome: Secondary | ICD-10-CM | POA: Diagnosis not present

## 2022-07-18 DIAGNOSIS — E669 Obesity, unspecified: Secondary | ICD-10-CM | POA: Diagnosis not present

## 2022-07-23 ENCOUNTER — Ambulatory Visit
Admission: RE | Admit: 2022-07-23 | Discharge: 2022-07-23 | Disposition: A | Discharge status: Home / Self Care | Payer: BC Managed Care – PPO | Source: Ambulatory Visit | Attending: Student | Admitting: Student

## 2022-07-23 DIAGNOSIS — I70213 Atherosclerosis of native arteries of extremities with intermittent claudication, bilateral legs: Secondary | ICD-10-CM | POA: Diagnosis not present

## 2022-07-23 DIAGNOSIS — I739 Peripheral vascular disease, unspecified: Secondary | ICD-10-CM | POA: Insufficient documentation

## 2022-08-03 ENCOUNTER — Ambulatory Visit (INDEPENDENT_AMBULATORY_CARE_PROVIDER_SITE_OTHER): Payer: BC Managed Care – PPO | Admitting: Podiatry

## 2022-08-03 DIAGNOSIS — M722 Plantar fascial fibromatosis: Secondary | ICD-10-CM

## 2022-08-03 DIAGNOSIS — E119 Type 2 diabetes mellitus without complications: Secondary | ICD-10-CM | POA: Diagnosis not present

## 2022-08-03 NOTE — Progress Notes (Signed)
Patient presents today to pick up custom orthotics. She has been wearing PowerSteps in the meantime, which have helped her pain. New orthotics were trimmed to fit into her shoes. Advised to break them in slowly over several days, and that her feet might be sore at first. Patient voiced understanding. She will call the office with any further questions or problems.

## 2022-08-06 ENCOUNTER — Ambulatory Visit: Payer: BC Managed Care – PPO | Admitting: Family Medicine

## 2022-08-06 VITALS — BP 130/81 | Ht 66.0 in | Wt 273.0 lb

## 2022-08-06 DIAGNOSIS — M25562 Pain in left knee: Secondary | ICD-10-CM

## 2022-08-06 DIAGNOSIS — M722 Plantar fascial fibromatosis: Secondary | ICD-10-CM

## 2022-08-06 DIAGNOSIS — M79672 Pain in left foot: Secondary | ICD-10-CM

## 2022-08-06 NOTE — Patient Instructions (Signed)
We will go ahead with MRIs of your knee and your foot. Continue with your arch support - make appointment with [place] for custom orthotics at your convenience. Do home exercises once a day as directed for your foot. I will call you with the results and next steps after the MRIs.

## 2022-08-07 ENCOUNTER — Encounter: Payer: Self-pay | Admitting: Family Medicine

## 2022-08-07 NOTE — Progress Notes (Signed)
PCP: Grewal, Michelle, MD  Subjective:   HPI: Patient is a 51 y.o. female here for left knee and left foot pain.  8/16: Her knee has been bothering her for about 1 month.  She describes the knee as " crunchy, catching, and locking".  Her pain is most significant over the medial aspect of the knee.  Her pain is exacerbated by walking.  She has been taking Celebrex occasionally but has not gotten much pain relief.  Denies any injury or trauma to the knee.  Denies any radiation of pain down the leg.  Of note, she does have history of meniscus surgery on the left knee about 4 years ago.  12/4: Patient reports she continues to struggle with left knee pain. No new injuries or trauma. Has been doing home exercises. Steroid injection helped with the sharpness quality of her pain Feels like the knee catches anteriorly. Knee also feels tight.  She also has plantar and medial left foot pain. She has been seen by another provider for this starting on 9/22. Has been doing some home exercises and stretches. Has tried custom orthotics which hurt worse but using powerstep inserts which feel the best. Worse with ambulation, better with rest.  Past Medical History:  Diagnosis Date   Allergic rhinitis    Anemia    Atrial fibrillation (HCC)    2006   Bronchial breathing    Chronic anxiety    Depression    Diabetes mellitus without complication (HCC)    Dysrhythmia    Elevated blood pressure    On therapy. Probably hypertension   Fibroid    Hx of cardiovascular stress test    a. Myoview 10/13:  no ischemia; EF 57%   Hypothyroid    Menorrhagia    2011   Obesity (BMI 30-39.9)    OSA (obstructive sleep apnea)    on CPAP   PCOS (polycystic ovarian syndrome)    PONV (postoperative nausea and vomiting)    C/o motion sickness   Prediabetes    Vitamin D deficiency     Current Outpatient Medications on File Prior to Visit  Medication Sig Dispense Refill   ascorbic acid (VITAMIN C) 1000 MG  tablet Take 1 tablet by mouth daily.     Blood Glucose Monitoring Suppl (FIFTY50 GLUCOSE METER 2.0) w/Device KIT Test as needed     cetirizine (ZYRTEC) 10 MG tablet Take 1 tablet (10 mg total) by mouth daily. 30 tablet 11   clotrimazole (LOTRIMIN) 1 % cream Apply topically daily in the afternoon.     escitalopram (LEXAPRO) 20 MG tablet Take 1 tablet (20 mg total) by mouth daily. 30 tablet 11   ipratropium (ATROVENT) 0.06 % nasal spray USE 2 SPRAYS IN BOTH  NOSTRILS 3 TIMES DAILY     levothyroxine (SYNTHROID) 137 MCG tablet Take by mouth.     liothyronine (CYTOMEL) 5 MCG tablet Take by mouth.     spironolactone (ALDACTONE) 50 MG tablet Take 1 tablet by mouth daily.     No current facility-administered medications on file prior to visit.    Past Surgical History:  Procedure Laterality Date   ATRIAL FIBRILLATION ABLATION N/A 01/17/2018   Procedure: ATRIAL FIBRILLATION ABLATION;  Surgeon: Camnitz, Will Martin, MD;  Location: [facility];  Service: Cardiovascular;  Laterality: N/A;   BLADDER SURGERY  1997   interstitial cystitis    BREAST REDUCTION SURGERY  06/2013   BREAST SURGERY     reduction   DILATION AND CURETTAGE, DIAGNOSTIC /   THERAPEUTIC     ENDOMETRIAL ABLATION     ENDOMETRIAL ABLATION  08/2010   HAND SURGERY  2006   due to cat bite   KNEE ARTHROSCOPY WITH MEDIAL MENISECTOMY Left 04/06/2015   Procedure: LEFT KNEE ARTHROSCOPY WITH PARTIAL MEDIAL MENISCECTOMY;  Surgeon: Naiping M Xu, MD;  Location: [facility];  Service: Orthopedics;  Laterality: Left;   OVARIAN CYST REMOVAL  04/28/13   REDUCTION MAMMAPLASTY Bilateral 2014   RENAL BIOPSY, OPEN  2006   TONSILLECTOMY  2007    Allergies  Allergen Reactions   Multaq [Dronedarone] Swelling    Per patient   Benadryl [Diphenhydramine Hcl] Itching   Diphenhydramine Itching   Fish Oil     Makes hands raw, mouth and female area  Other reaction(s): Other   Influenza Vaccines     Gives pt the flu and site of injection swells and hot    Influenza Virus Vaccine     Other reaction(s): Other   Latex     Burns skin   Oxycodone Nausea And Vomiting   Pedi-Pre Tape Spray [Wound Dressing Adhesive] Itching    Burns skin, band-aid, ekg stickers  Burns skin, band-aid, ekg stickers   Shellfish Allergy     Other reaction(s): Contact Dermatitis (intolerance)   Shrimp Extract Allergy Skin Test    Tape     Burns skin, band-aid, ekg stickers   Sulfa Antibiotics Rash    Other reaction(s): Other    BP 130/81   Ht 5' 6" (1.676 m)   Wt 273 lb (123.8 kg)   LMP 07/22/2019   BMI 44.06 kg/m       No data to display              No data to display              Objective:  Physical Exam:  Gen: NAD, comfortable in exam room  Left knee: Mild effusion.  No other gross deformity, ecchymoses. TTP medial joint line. FROM with normal strength. Negative ant/post drawers. Negative valgus/varus testing. Negative lachman.  Negative mcmurrays, apleys, thessalys. NV intact distally.  Left foot/ankle: Pes planus.  No other gross deformity, swelling, ecchymoses FROM with mild pain on internal rotation TTP distal post tib tendon into navicular, plantar fascia insertion proximally. Negative ant drawer and negative talar tilt.   Negative calcaneal squeeze. Thompsons test negative. NV intact distally.   Assessment & Plan:  1. Left knee pain - concerning for medial meniscus tear given lack of improvement with conservative measures including home exercises and steroid injection for more than 3 months.  If pain was due to arthritis steroid injection should have helped much more than it has.  Continue home exercises.  Tylenol, ice/heat if needed.  Will proceed with MRI to assess for medial meniscus tear.  2. Left foot pain - not improving with arch supports, home exercises and stretches since 9/22.  Will proceed with MRI of foot to assess for post tib tendon tear, stress fracture of navicular.   

## 2022-08-08 ENCOUNTER — Telehealth: Payer: Self-pay | Admitting: *Deleted

## 2022-08-08 NOTE — Telephone Encounter (Signed)
We can have the orthotics modified to the arch more than the factory standard.  Thanks, Dr. Amalia Hailey

## 2022-08-08 NOTE — Telephone Encounter (Signed)
[name] informed me that Desiree Schultz had called and said she was not happy with her orthotics.  She said they were not high enough in the arch.  She said Auset stated she was not happy and was considering going somewhere else. I attempted to call the patient back.  I left her a message to call be back tomorrow.  I will try to call her as well.

## 2022-08-09 NOTE — Telephone Encounter (Signed)
LVM FOR PT TO CALL BACK TO DISCUSS MODIFICATION OPTIONS AVAILABLE

## 2022-08-21 DIAGNOSIS — M9903 Segmental and somatic dysfunction of lumbar region: Secondary | ICD-10-CM | POA: Diagnosis not present

## 2022-08-21 DIAGNOSIS — M9906 Segmental and somatic dysfunction of lower extremity: Secondary | ICD-10-CM | POA: Diagnosis not present

## 2022-08-21 DIAGNOSIS — M9902 Segmental and somatic dysfunction of thoracic region: Secondary | ICD-10-CM | POA: Diagnosis not present

## 2022-08-21 DIAGNOSIS — M9905 Segmental and somatic dysfunction of pelvic region: Secondary | ICD-10-CM | POA: Diagnosis not present

## 2022-08-21 DIAGNOSIS — M9901 Segmental and somatic dysfunction of cervical region: Secondary | ICD-10-CM | POA: Diagnosis not present

## 2022-08-22 DIAGNOSIS — F32A Depression, unspecified: Secondary | ICD-10-CM | POA: Diagnosis not present

## 2022-08-22 DIAGNOSIS — F1010 Alcohol abuse, uncomplicated: Secondary | ICD-10-CM | POA: Diagnosis not present

## 2022-08-22 DIAGNOSIS — F419 Anxiety disorder, unspecified: Secondary | ICD-10-CM | POA: Diagnosis not present

## 2022-08-29 ENCOUNTER — Ambulatory Visit (INDEPENDENT_AMBULATORY_CARE_PROVIDER_SITE_OTHER): Payer: BC Managed Care – PPO | Admitting: Podiatry

## 2022-08-29 DIAGNOSIS — M722 Plantar fascial fibromatosis: Secondary | ICD-10-CM

## 2022-09-01 ENCOUNTER — Ambulatory Visit
Admission: RE | Admit: 2022-09-01 | Discharge: 2022-09-01 | Disposition: A | Discharge status: Home / Self Care | Payer: BC Managed Care – PPO | Source: Ambulatory Visit | Attending: Family Medicine | Admitting: Family Medicine

## 2022-09-01 DIAGNOSIS — M25462 Effusion, left knee: Secondary | ICD-10-CM | POA: Diagnosis not present

## 2022-09-01 DIAGNOSIS — M7122 Synovial cyst of popliteal space [Baker], left knee: Secondary | ICD-10-CM | POA: Diagnosis not present

## 2022-09-01 DIAGNOSIS — M79672 Pain in left foot: Secondary | ICD-10-CM

## 2022-09-01 DIAGNOSIS — M1712 Unilateral primary osteoarthritis, left knee: Secondary | ICD-10-CM | POA: Diagnosis not present

## 2022-09-01 DIAGNOSIS — M65872 Other synovitis and tenosynovitis, left ankle and foot: Secondary | ICD-10-CM | POA: Diagnosis not present

## 2022-09-01 DIAGNOSIS — S93422A Sprain of deltoid ligament of left ankle, initial encounter: Secondary | ICD-10-CM | POA: Diagnosis not present

## 2022-09-01 DIAGNOSIS — S83232A Complex tear of medial meniscus, current injury, left knee, initial encounter: Secondary | ICD-10-CM | POA: Diagnosis not present

## 2022-09-01 DIAGNOSIS — M7732 Calcaneal spur, left foot: Secondary | ICD-10-CM | POA: Diagnosis not present

## 2022-09-01 DIAGNOSIS — M25562 Pain in left knee: Secondary | ICD-10-CM

## 2022-09-01 DIAGNOSIS — M67472 Ganglion, left ankle and foot: Secondary | ICD-10-CM | POA: Diagnosis not present

## 2022-09-03 DIAGNOSIS — E119 Type 2 diabetes mellitus without complications: Secondary | ICD-10-CM | POA: Diagnosis not present

## 2022-09-11 DIAGNOSIS — F1010 Alcohol abuse, uncomplicated: Secondary | ICD-10-CM | POA: Diagnosis not present

## 2022-09-11 DIAGNOSIS — F32A Depression, unspecified: Secondary | ICD-10-CM | POA: Diagnosis not present

## 2022-09-11 DIAGNOSIS — F419 Anxiety disorder, unspecified: Secondary | ICD-10-CM | POA: Diagnosis not present

## 2022-09-14 DIAGNOSIS — R5383 Other fatigue: Secondary | ICD-10-CM | POA: Diagnosis not present

## 2022-09-14 DIAGNOSIS — E119 Type 2 diabetes mellitus without complications: Secondary | ICD-10-CM | POA: Diagnosis not present

## 2022-09-14 DIAGNOSIS — G4730 Sleep apnea, unspecified: Secondary | ICD-10-CM | POA: Diagnosis not present

## 2022-09-14 DIAGNOSIS — E282 Polycystic ovarian syndrome: Secondary | ICD-10-CM | POA: Diagnosis not present

## 2022-09-14 DIAGNOSIS — E538 Deficiency of other specified B group vitamins: Secondary | ICD-10-CM | POA: Diagnosis not present

## 2022-09-14 DIAGNOSIS — E039 Hypothyroidism, unspecified: Secondary | ICD-10-CM | POA: Diagnosis not present

## 2022-09-14 MED ORDER — NITROGLYCERIN 0.2 MG/HR TD PT24: MEDICATED_PATCH | TRANSDERMAL | 1 refills | Status: DC

## 2022-09-14 NOTE — Addendum Note (Signed)
Addended by: Dene Gentry on: 09/14/2022 04:08 PM   Modules accepted: Orders

## 2022-09-18 DIAGNOSIS — F419 Anxiety disorder, unspecified: Secondary | ICD-10-CM | POA: Diagnosis not present

## 2022-09-18 DIAGNOSIS — Z713 Dietary counseling and surveillance: Secondary | ICD-10-CM | POA: Diagnosis not present

## 2022-09-18 DIAGNOSIS — F32A Depression, unspecified: Secondary | ICD-10-CM | POA: Diagnosis not present

## 2022-09-18 NOTE — Progress Notes (Signed)
Patient presents today to pick up custom molded foot orthotics recommended by Dr. Amalia Hailey.   Orthotics were not dispensed.  Patient stated that the orthotics were not high enough on the arch. Patient has been wearing powersteps and stated that they seem to be sturdier than the custom ones. I explained to the patient that the arches were raised bilaterally but unfortunately this is done in a gradual process to avoid having them raised too high. Spoke with [name] and they are able to modify them for her and they will "raise the arch to the max" as well as hug the arch.   Patient was concerned about supination when doing the higher arch. Informed the patient that the provider would make the final decision on all modifications and she was pleased with the plan.  Patient will await a call to come in for a fitting appointment. She would like to be seen in the [place] location if possible.

## 2022-09-19 ENCOUNTER — Other Ambulatory Visit: Payer: Self-pay | Admitting: Obstetrics and Gynecology

## 2022-09-19 ENCOUNTER — Ambulatory Visit: Payer: BC Managed Care – PPO

## 2022-09-19 DIAGNOSIS — Z1231 Encounter for screening mammogram for malignant neoplasm of breast: Secondary | ICD-10-CM

## 2022-09-19 DIAGNOSIS — M722 Plantar fascial fibromatosis: Secondary | ICD-10-CM

## 2022-09-20 DIAGNOSIS — M9905 Segmental and somatic dysfunction of pelvic region: Secondary | ICD-10-CM | POA: Diagnosis not present

## 2022-09-20 DIAGNOSIS — M9906 Segmental and somatic dysfunction of lower extremity: Secondary | ICD-10-CM | POA: Diagnosis not present

## 2022-09-20 DIAGNOSIS — M9903 Segmental and somatic dysfunction of lumbar region: Secondary | ICD-10-CM | POA: Diagnosis not present

## 2022-09-20 DIAGNOSIS — M9901 Segmental and somatic dysfunction of cervical region: Secondary | ICD-10-CM | POA: Diagnosis not present

## 2022-09-20 DIAGNOSIS — M9902 Segmental and somatic dysfunction of thoracic region: Secondary | ICD-10-CM | POA: Diagnosis not present

## 2022-09-25 DIAGNOSIS — F32A Depression, unspecified: Secondary | ICD-10-CM | POA: Diagnosis not present

## 2022-09-25 DIAGNOSIS — F419 Anxiety disorder, unspecified: Secondary | ICD-10-CM | POA: Diagnosis not present

## 2022-09-25 DIAGNOSIS — F1010 Alcohol abuse, uncomplicated: Secondary | ICD-10-CM | POA: Diagnosis not present

## 2022-09-25 NOTE — Progress Notes (Signed)
   See benefit investigation above. Pt responsible for 100% cost of medication until OOP is met. PA required. Pt is not interested in injections at this time. Will call back if she changes her mind.

## 2022-09-26 DIAGNOSIS — F1010 Alcohol abuse, uncomplicated: Secondary | ICD-10-CM | POA: Diagnosis not present

## 2022-09-26 DIAGNOSIS — F32A Depression, unspecified: Secondary | ICD-10-CM | POA: Diagnosis not present

## 2022-09-26 DIAGNOSIS — F419 Anxiety disorder, unspecified: Secondary | ICD-10-CM | POA: Diagnosis not present

## 2022-09-28 ENCOUNTER — Telehealth: Payer: Self-pay | Admitting: Podiatry

## 2022-09-28 DIAGNOSIS — E063 Autoimmune thyroiditis: Secondary | ICD-10-CM | POA: Diagnosis not present

## 2022-09-28 DIAGNOSIS — E669 Obesity, unspecified: Secondary | ICD-10-CM | POA: Diagnosis not present

## 2022-09-28 DIAGNOSIS — Z77120 Contact with and (suspected) exposure to mold (toxic): Secondary | ICD-10-CM | POA: Diagnosis not present

## 2022-09-28 DIAGNOSIS — R5383 Other fatigue: Secondary | ICD-10-CM | POA: Diagnosis not present

## 2022-09-28 NOTE — Telephone Encounter (Signed)
Whatever appt is available. Okay to schedule a 4:30 if needed. - Dr. Amalia Hailey

## 2022-09-28 NOTE — Telephone Encounter (Signed)
Patient wants a 430 appointment with Dr Amalia Hailey only to check these orthotics , she has been dealing with this problem for over a year please advise when would be good for you to see her.

## 2022-10-02 DIAGNOSIS — F32A Depression, unspecified: Secondary | ICD-10-CM | POA: Diagnosis not present

## 2022-10-02 DIAGNOSIS — F1010 Alcohol abuse, uncomplicated: Secondary | ICD-10-CM | POA: Diagnosis not present

## 2022-10-02 DIAGNOSIS — F419 Anxiety disorder, unspecified: Secondary | ICD-10-CM | POA: Diagnosis not present

## 2022-10-04 DIAGNOSIS — E119 Type 2 diabetes mellitus without complications: Secondary | ICD-10-CM | POA: Diagnosis not present

## 2022-10-04 DIAGNOSIS — M9903 Segmental and somatic dysfunction of lumbar region: Secondary | ICD-10-CM | POA: Diagnosis not present

## 2022-10-04 DIAGNOSIS — M9902 Segmental and somatic dysfunction of thoracic region: Secondary | ICD-10-CM | POA: Diagnosis not present

## 2022-10-04 DIAGNOSIS — M9905 Segmental and somatic dysfunction of pelvic region: Secondary | ICD-10-CM | POA: Diagnosis not present

## 2022-10-04 DIAGNOSIS — M9901 Segmental and somatic dysfunction of cervical region: Secondary | ICD-10-CM | POA: Diagnosis not present

## 2022-10-04 DIAGNOSIS — M9906 Segmental and somatic dysfunction of lower extremity: Secondary | ICD-10-CM | POA: Diagnosis not present

## 2022-10-09 ENCOUNTER — Ambulatory Visit (INDEPENDENT_AMBULATORY_CARE_PROVIDER_SITE_OTHER): Payer: BC Managed Care – PPO | Admitting: Podiatry

## 2022-10-09 VITALS — BP 132/80 | HR 65

## 2022-10-09 DIAGNOSIS — M722 Plantar fascial fibromatosis: Secondary | ICD-10-CM

## 2022-10-09 NOTE — Progress Notes (Signed)
   Patient presents today for orthotics dispensable.  Patient states that this is her fourth pair of modified orthotics that she has received from our office.  Previous orthotics have not contoured her arch well or supported her arch.  Orthotics were dispensed to the patient today which do seem to contour her arch nicely.  After standing on the orthotics she says that these are the best pair that she has had so far.  Clinically they do appear somewhat narrow but they are the same with as her power step insoles that she finds very comfortable.  Orthotics were dispensed.  She will wear her orthotics over the next few days and reach out to me if there needs to be an adjustment for a wider fit insole.  Return to clinic as needed if  Edrick Kins, DPM [facility]  Dr. Edrick Kins, DPM    [address], [place] [address]                Office [phone]  Fax [phone]

## 2022-10-10 DIAGNOSIS — F1010 Alcohol abuse, uncomplicated: Secondary | ICD-10-CM | POA: Diagnosis not present

## 2022-10-10 DIAGNOSIS — F419 Anxiety disorder, unspecified: Secondary | ICD-10-CM | POA: Diagnosis not present

## 2022-10-10 DIAGNOSIS — F32A Depression, unspecified: Secondary | ICD-10-CM | POA: Diagnosis not present

## 2022-10-11 DIAGNOSIS — F32A Depression, unspecified: Secondary | ICD-10-CM | POA: Diagnosis not present

## 2022-10-11 DIAGNOSIS — F419 Anxiety disorder, unspecified: Secondary | ICD-10-CM | POA: Diagnosis not present

## 2022-10-16 DIAGNOSIS — F32A Depression, unspecified: Secondary | ICD-10-CM | POA: Diagnosis not present

## 2022-10-16 DIAGNOSIS — F419 Anxiety disorder, unspecified: Secondary | ICD-10-CM | POA: Diagnosis not present

## 2022-10-18 DIAGNOSIS — M9906 Segmental and somatic dysfunction of lower extremity: Secondary | ICD-10-CM | POA: Diagnosis not present

## 2022-10-18 DIAGNOSIS — M9902 Segmental and somatic dysfunction of thoracic region: Secondary | ICD-10-CM | POA: Diagnosis not present

## 2022-10-18 DIAGNOSIS — M9901 Segmental and somatic dysfunction of cervical region: Secondary | ICD-10-CM | POA: Diagnosis not present

## 2022-10-18 DIAGNOSIS — M9905 Segmental and somatic dysfunction of pelvic region: Secondary | ICD-10-CM | POA: Diagnosis not present

## 2022-10-18 DIAGNOSIS — M9903 Segmental and somatic dysfunction of lumbar region: Secondary | ICD-10-CM | POA: Diagnosis not present

## 2022-10-23 DIAGNOSIS — G8929 Other chronic pain: Secondary | ICD-10-CM | POA: Diagnosis not present

## 2022-10-23 DIAGNOSIS — M25562 Pain in left knee: Secondary | ICD-10-CM | POA: Diagnosis not present

## 2022-10-23 DIAGNOSIS — M25561 Pain in right knee: Secondary | ICD-10-CM | POA: Diagnosis not present

## 2022-10-23 DIAGNOSIS — M170 Bilateral primary osteoarthritis of knee: Secondary | ICD-10-CM | POA: Diagnosis not present

## 2022-10-25 DIAGNOSIS — F419 Anxiety disorder, unspecified: Secondary | ICD-10-CM | POA: Diagnosis not present

## 2022-10-25 DIAGNOSIS — F32A Depression, unspecified: Secondary | ICD-10-CM | POA: Diagnosis not present

## 2022-10-25 DIAGNOSIS — F4310 Post-traumatic stress disorder, unspecified: Secondary | ICD-10-CM | POA: Diagnosis not present

## 2022-10-30 DIAGNOSIS — F419 Anxiety disorder, unspecified: Secondary | ICD-10-CM | POA: Diagnosis not present

## 2022-10-30 DIAGNOSIS — F4310 Post-traumatic stress disorder, unspecified: Secondary | ICD-10-CM | POA: Diagnosis not present

## 2022-10-30 DIAGNOSIS — F32A Depression, unspecified: Secondary | ICD-10-CM | POA: Diagnosis not present

## 2022-11-01 DIAGNOSIS — M9903 Segmental and somatic dysfunction of lumbar region: Secondary | ICD-10-CM | POA: Diagnosis not present

## 2022-11-01 DIAGNOSIS — M9905 Segmental and somatic dysfunction of pelvic region: Secondary | ICD-10-CM | POA: Diagnosis not present

## 2022-11-01 DIAGNOSIS — M9901 Segmental and somatic dysfunction of cervical region: Secondary | ICD-10-CM | POA: Diagnosis not present

## 2022-11-01 DIAGNOSIS — M9902 Segmental and somatic dysfunction of thoracic region: Secondary | ICD-10-CM | POA: Diagnosis not present

## 2022-11-01 DIAGNOSIS — M9906 Segmental and somatic dysfunction of lower extremity: Secondary | ICD-10-CM | POA: Diagnosis not present

## 2022-11-02 DIAGNOSIS — E119 Type 2 diabetes mellitus without complications: Secondary | ICD-10-CM | POA: Diagnosis not present

## 2022-11-06 DIAGNOSIS — F419 Anxiety disorder, unspecified: Secondary | ICD-10-CM | POA: Diagnosis not present

## 2022-11-06 DIAGNOSIS — F32A Depression, unspecified: Secondary | ICD-10-CM | POA: Diagnosis not present

## 2022-11-06 DIAGNOSIS — F4310 Post-traumatic stress disorder, unspecified: Secondary | ICD-10-CM | POA: Diagnosis not present

## 2022-11-13 DIAGNOSIS — F4310 Post-traumatic stress disorder, unspecified: Secondary | ICD-10-CM | POA: Diagnosis not present

## 2022-11-13 DIAGNOSIS — F419 Anxiety disorder, unspecified: Secondary | ICD-10-CM | POA: Diagnosis not present

## 2022-11-13 DIAGNOSIS — F32A Depression, unspecified: Secondary | ICD-10-CM | POA: Diagnosis not present

## 2022-11-15 DIAGNOSIS — M9901 Segmental and somatic dysfunction of cervical region: Secondary | ICD-10-CM | POA: Diagnosis not present

## 2022-11-15 DIAGNOSIS — M9902 Segmental and somatic dysfunction of thoracic region: Secondary | ICD-10-CM | POA: Diagnosis not present

## 2022-11-15 DIAGNOSIS — M9905 Segmental and somatic dysfunction of pelvic region: Secondary | ICD-10-CM | POA: Diagnosis not present

## 2022-11-15 DIAGNOSIS — M9906 Segmental and somatic dysfunction of lower extremity: Secondary | ICD-10-CM | POA: Diagnosis not present

## 2022-11-15 DIAGNOSIS — M9903 Segmental and somatic dysfunction of lumbar region: Secondary | ICD-10-CM | POA: Diagnosis not present

## 2022-11-20 DIAGNOSIS — F4310 Post-traumatic stress disorder, unspecified: Secondary | ICD-10-CM | POA: Diagnosis not present

## 2022-11-20 DIAGNOSIS — F32A Depression, unspecified: Secondary | ICD-10-CM | POA: Diagnosis not present

## 2022-11-20 DIAGNOSIS — F419 Anxiety disorder, unspecified: Secondary | ICD-10-CM | POA: Diagnosis not present

## 2022-11-21 DIAGNOSIS — E063 Autoimmune thyroiditis: Secondary | ICD-10-CM | POA: Diagnosis not present

## 2022-11-21 DIAGNOSIS — E7211 Homocystinuria: Secondary | ICD-10-CM | POA: Diagnosis not present

## 2022-11-21 DIAGNOSIS — E119 Type 2 diabetes mellitus without complications: Secondary | ICD-10-CM | POA: Diagnosis not present

## 2022-11-21 DIAGNOSIS — E7219 Other disorders of sulfur-bearing amino-acid metabolism: Secondary | ICD-10-CM | POA: Diagnosis not present

## 2022-11-21 DIAGNOSIS — G4730 Sleep apnea, unspecified: Secondary | ICD-10-CM | POA: Diagnosis not present

## 2022-11-21 DIAGNOSIS — E669 Obesity, unspecified: Secondary | ICD-10-CM | POA: Diagnosis not present

## 2022-11-21 DIAGNOSIS — E538 Deficiency of other specified B group vitamins: Secondary | ICD-10-CM | POA: Diagnosis not present

## 2022-11-21 DIAGNOSIS — E039 Hypothyroidism, unspecified: Secondary | ICD-10-CM | POA: Diagnosis not present

## 2022-11-26 ENCOUNTER — Ambulatory Visit
Admission: EM | Admit: 2022-11-26 | Discharge: 2022-11-26 | Disposition: A | Discharge status: Home / Self Care | Payer: BC Managed Care – PPO

## 2022-11-26 DIAGNOSIS — B349 Viral infection, unspecified: Secondary | ICD-10-CM

## 2022-11-26 NOTE — ED Triage Notes (Signed)
Pt presents with chills, nausea, fatigue, and generalized body aches X 6 days.

## 2022-11-26 NOTE — ED Provider Notes (Signed)
[facility]    CSN: [id number] Arrival date & time: 11/26/22  1530      History   Chief Complaint Chief Complaint  Patient presents with   Chills   Generalized Body Aches   Nausea   Fatigue    HPI Desiree Schultz is a 52 y.o. female.   Complains of feeling sick for the last 6 days.  Patient reports she feels the same as she did when she had COVID 3 times before.  Patient reports has been running a low fever at home patient reports temperature has been 96 and 97.  Patient complains of increased fatigue and increased appetite.  Patient states she took 2 home COVID test which were negative.  Patient reports an exposure to COVID.  Patient reports she has recently seen her integrative care physician for blood work.   Patient reports that she is having evaluation for thyroid disease.  Patient reports her integrative physician is testing her to see why her body is attacking her thyroid.  Patient reports that she has chronically swollen lymph nodes in her neck.  Patient reports that they have been swollen for as long as she can remember.  Past Medical History:  Diagnosis Date   Allergic rhinitis    Anemia    Atrial fibrillation ([place])    2006   Bronchial breathing    Chronic anxiety    Depression    Diabetes mellitus without complication ([place])    Dysrhythmia    Elevated blood pressure    On therapy. Probably hypertension   Fibroid    Hx of cardiovascular stress test    a. Myoview 10/13:  no ischemia; EF 57%   Hypothyroid    Menorrhagia    2011   Obesity (BMI 30-39.9)    OSA (obstructive sleep apnea)    on CPAP   PCOS (polycystic ovarian syndrome)    PONV (postoperative nausea and vomiting)    C/o motion sickness   Prediabetes    Vitamin D deficiency     Patient Active Problem List   Diagnosis Date Noted   Paroxysmal A-fib ([place]) 01/17/2018   Chronic pain of right knee 05/28/2017   Controlled type 2 diabetes mellitus without complication, without  long-term current use of insulin ([place]) 08/30/2016   Menopause [id number]   Metabolic syndrome [id number]   Bilateral edema of lower extremity 09/27/2015   Proteinuria of undiagnosed cause 09/27/2015   [place] hump 08/24/2015   Prediabetes 02/10/2015   Left knee pain 12/21/2014   Depression 12/16/2014   Hypothyroidism 10/05/2014   Polycystic disease, ovaries 02/11/2014   Obesity 11/14/2011   OSA (obstructive sleep apnea) 01/09/2011   Atrial fibrillation ([place]) 12/01/2010   Healthcare maintenance 12/01/2010   GAIT ABNORMALITY 01/16/2010    Past Surgical History:  Procedure Laterality Date   ATRIAL FIBRILLATION ABLATION N/A 01/17/2018   Procedure: ATRIAL FIBRILLATION ABLATION;  Surgeon: Constance Haw, MD;  Location: [name];  Service: Cardiovascular;  Laterality: N/A;   BLADDER SURGERY  1997   interstitial cystitis    BREAST REDUCTION SURGERY  06/2013   BREAST SURGERY     reduction   DILATION AND CURETTAGE, DIAGNOSTIC / THERAPEUTIC     ENDOMETRIAL ABLATION     ENDOMETRIAL ABLATION  08/2010   HAND SURGERY  2006   due to cat bite   KNEE ARTHROSCOPY WITH MEDIAL MENISECTOMY Left 04/06/2015   Procedure: LEFT KNEE ARTHROSCOPY WITH PARTIAL MEDIAL MENISCECTOMY;  Surgeon: Leandrew Koyanagi, MD;  Location:  [facility];  Service: Orthopedics;  Laterality: Left;   OVARIAN CYST REMOVAL  04/28/13   REDUCTION MAMMAPLASTY Bilateral 2014   RENAL BIOPSY, OPEN  2006   TONSILLECTOMY  2007    OB History   No obstetric history on file.      Home Medications    Prior to Admission medications   Medication Sig Start Date End Date Taking? Authorizing Provider  ascorbic acid (VITAMIN C) 1000 MG tablet Take 1 tablet by mouth daily.    [provider]  Blood Glucose Monitoring Suppl (FIFTY50 GLUCOSE METER 2.0) w/Device KIT Test as needed 07/23/19   [provider]  cetirizine (ZYRTEC) 10 MG tablet Take 1 tablet (10 mg total) by mouth daily. 05/21/16   Mack Hook, MD   clotrimazole (LOTRIMIN) 1 % cream Apply topically daily in the afternoon. 07/11/20   [provider]  escitalopram (LEXAPRO) 20 MG tablet Take 1 tablet (20 mg total) by mouth daily. 05/25/16   Mack Hook, MD  ipratropium (ATROVENT) 0.06 % nasal spray USE 2 SPRAYS IN BOTH  NOSTRILS 3 TIMES DAILY 08/14/19   [provider]  levothyroxine (SYNTHROID) 137 MCG tablet Take by mouth. 08/17/19   [provider]  liothyronine (CYTOMEL) 5 MCG tablet Take by mouth.    [provider]  nitroGLYCERIN (NITRODUR - DOSED IN MG/24 HR) 0.2 mg/hr patch Apply 1/4th patch to affected ankle, change daily 09/14/22   Hudnall, Sharyn Lull, MD  spironolactone (ALDACTONE) 50 MG tablet Take 1 tablet by mouth daily. 10/20/13   [provider]    Family History Family History  Problem Relation Age of Onset   Cardiomyopathy Father    Heart disease Father    Hyperlipidemia Father    Hypertension Father    Cardiomyopathy Brother    Hypertension Maternal Great-grandmother        fathers side   Hyperlipidemia Maternal Great-grandmother        fathers side   Heart failure Maternal Great-grandmother        both grandparents - paternal   Colon cancer Maternal Great-grandmother    Coronary artery disease Maternal Grandmother    Heart attack Maternal Grandmother        and other maternal family members   Heart attack Maternal Grandfather    Esophageal cancer Neg Hx    Rectal cancer Neg Hx    Stomach cancer Neg Hx     Social History Social History   Tobacco Use   Smoking status: Former    Packs/day: 2.00    Years: 16.00    Additional pack years: 0.00    Total pack years: 32.00    Types: Cigarettes    Quit date: 09/03/1998    Years since quitting: 24.2   Smokeless tobacco: Never  Vaping Use   Vaping Use: Never used  Substance Use Topics   Alcohol use: Yes    Comment: maybe 2 a mth   Drug use: Yes    Types: Marijuana    Comment: occasional     Allergies    Multaq [dronedarone], Benadryl [diphenhydramine hcl], Diphenhydramine, Fish oil, Influenza vaccines, Influenza virus vaccine, Latex, Oxycodone, Pedi-pre tape spray [wound dressing adhesive], Shellfish allergy, Shrimp extract, Tape, and Sulfa antibiotics   Review of Systems Review of Systems  All other systems reviewed and are negative.    Physical Exam Triage Vital Signs ED Triage Vitals  Enc Vitals Group     BP 11/26/22 1703 127/83     Pulse Rate 11/26/22  1703 74     Resp 11/26/22 1703 17     Temp 11/26/22 1703 99 F (37.2 C)     Temp Source 11/26/22 1703 Oral     SpO2 11/26/22 1703 96 %     Weight --      Height --      Head Circumference --      Peak Flow --      Pain Score 11/26/22 1706 5     Pain Loc --      Pain Edu? --      Excl. in [place]? --    No data found.  Updated Vital Signs BP 127/83 (BP Location: Left Arm)   Pulse 74   Temp 99 F (37.2 C) (Oral)   Resp 17   LMP 07/22/2019   SpO2 96%   Visual Acuity Right Eye Distance:   Left Eye Distance:   Bilateral Distance:    Right Eye Near:   Left Eye Near:    Bilateral Near:     Physical Exam Vitals and nursing note reviewed.  Constitutional:      Appearance: She is well-developed.  HENT:     Head: Normocephalic.     Right Ear: Tympanic membrane normal.     Left Ear: Tympanic membrane normal.     Mouth/Throat:     Mouth: Mucous membranes are moist.  Cardiovascular:     Rate and Rhythm: Normal rate.  Pulmonary:     Effort: Pulmonary effort is normal.  Abdominal:     General: There is no distension.  Musculoskeletal:        General: Normal range of motion.     Cervical back: Normal range of motion.  Skin:    General: Skin is warm.  Neurological:     General: No focal deficit present.     Mental Status: She is alert and oriented to person, place, and time.  Psychiatric:        Mood and Affect: Mood normal.      UC Treatments / Results  Labs (all labs ordered are listed, but only  abnormal results are displayed) Labs Reviewed - No data to display  EKG   Radiology No results found.  Procedures Procedures (including critical care time)  Medications Ordered in UC Medications - No data to display  Initial Impression / Assessment and Plan / UC Course  I have reviewed the triage vital signs and the nursing notes.  Pertinent labs & imaging results that were available during my care of the patient were reviewed by me and considered in my medical decision making (see chart for details).   Medical decision making: Patient advised she may have had a viral infection.  Patient is past to the point that I would treat COVID or influenza.  Patient has normal vital signs and a normal exam today.  I advised patient she should follow-up on her laboratory evaluation with her physician.  Patient is advised to see her primary care physician for recheck of thyroid concerns.  Patient appears stable for discharge I advised her continued symptomatic care   Final Clinical Impressions(s) / UC Diagnoses   Final diagnoses:  Viral illness   Discharge Instructions   None    ED Prescriptions   None    PDMP not reviewed this encounter. An After Visit Summary was printed and given to the patient.    Fransico Meadow, [place] 11/26/22 [phone]

## 2022-11-27 DIAGNOSIS — F419 Anxiety disorder, unspecified: Secondary | ICD-10-CM | POA: Diagnosis not present

## 2022-11-27 DIAGNOSIS — F4310 Post-traumatic stress disorder, unspecified: Secondary | ICD-10-CM | POA: Diagnosis not present

## 2022-11-27 DIAGNOSIS — F32A Depression, unspecified: Secondary | ICD-10-CM | POA: Diagnosis not present

## 2022-11-29 DIAGNOSIS — M9906 Segmental and somatic dysfunction of lower extremity: Secondary | ICD-10-CM | POA: Diagnosis not present

## 2022-11-29 DIAGNOSIS — M9901 Segmental and somatic dysfunction of cervical region: Secondary | ICD-10-CM | POA: Diagnosis not present

## 2022-11-29 DIAGNOSIS — M9905 Segmental and somatic dysfunction of pelvic region: Secondary | ICD-10-CM | POA: Diagnosis not present

## 2022-11-29 DIAGNOSIS — M9903 Segmental and somatic dysfunction of lumbar region: Secondary | ICD-10-CM | POA: Diagnosis not present

## 2022-11-29 DIAGNOSIS — E119 Type 2 diabetes mellitus without complications: Secondary | ICD-10-CM | POA: Diagnosis not present

## 2022-11-29 DIAGNOSIS — M9902 Segmental and somatic dysfunction of thoracic region: Secondary | ICD-10-CM | POA: Diagnosis not present

## 2022-12-03 DIAGNOSIS — E119 Type 2 diabetes mellitus without complications: Secondary | ICD-10-CM | POA: Diagnosis not present

## 2022-12-04 DIAGNOSIS — F4310 Post-traumatic stress disorder, unspecified: Secondary | ICD-10-CM | POA: Diagnosis not present

## 2022-12-04 DIAGNOSIS — F32A Depression, unspecified: Secondary | ICD-10-CM | POA: Diagnosis not present

## 2022-12-04 DIAGNOSIS — F419 Anxiety disorder, unspecified: Secondary | ICD-10-CM | POA: Diagnosis not present

## 2022-12-06 DIAGNOSIS — R5383 Other fatigue: Secondary | ICD-10-CM | POA: Diagnosis not present

## 2022-12-06 DIAGNOSIS — E063 Autoimmune thyroiditis: Secondary | ICD-10-CM | POA: Diagnosis not present

## 2022-12-06 DIAGNOSIS — E669 Obesity, unspecified: Secondary | ICD-10-CM | POA: Diagnosis not present

## 2022-12-06 DIAGNOSIS — Z77120 Contact with and (suspected) exposure to mold (toxic): Secondary | ICD-10-CM | POA: Diagnosis not present

## 2022-12-10 DIAGNOSIS — Z01419 Encounter for gynecological examination (general) (routine) without abnormal findings: Secondary | ICD-10-CM | POA: Diagnosis not present

## 2022-12-10 DIAGNOSIS — N9089 Other specified noninflammatory disorders of vulva and perineum: Secondary | ICD-10-CM | POA: Diagnosis not present

## 2022-12-10 DIAGNOSIS — Z13820 Encounter for screening for osteoporosis: Secondary | ICD-10-CM | POA: Diagnosis not present

## 2022-12-10 DIAGNOSIS — Z6841 Body Mass Index (BMI) 40.0 and over, adult: Secondary | ICD-10-CM | POA: Diagnosis not present

## 2022-12-11 DIAGNOSIS — F4310 Post-traumatic stress disorder, unspecified: Secondary | ICD-10-CM | POA: Diagnosis not present

## 2022-12-11 DIAGNOSIS — F32A Depression, unspecified: Secondary | ICD-10-CM | POA: Diagnosis not present

## 2022-12-11 DIAGNOSIS — F419 Anxiety disorder, unspecified: Secondary | ICD-10-CM | POA: Diagnosis not present

## 2022-12-12 DIAGNOSIS — E7219 Other disorders of sulfur-bearing amino-acid metabolism: Secondary | ICD-10-CM | POA: Diagnosis not present

## 2022-12-12 DIAGNOSIS — E669 Obesity, unspecified: Secondary | ICD-10-CM | POA: Diagnosis not present

## 2022-12-12 DIAGNOSIS — E063 Autoimmune thyroiditis: Secondary | ICD-10-CM | POA: Diagnosis not present

## 2022-12-12 DIAGNOSIS — E039 Hypothyroidism, unspecified: Secondary | ICD-10-CM | POA: Diagnosis not present

## 2022-12-20 DIAGNOSIS — F419 Anxiety disorder, unspecified: Secondary | ICD-10-CM | POA: Diagnosis not present

## 2022-12-20 DIAGNOSIS — F32A Depression, unspecified: Secondary | ICD-10-CM | POA: Diagnosis not present

## 2022-12-20 DIAGNOSIS — F4310 Post-traumatic stress disorder, unspecified: Secondary | ICD-10-CM | POA: Diagnosis not present

## 2022-12-21 ENCOUNTER — Ambulatory Visit
Admission: RE | Admit: 2022-12-21 | Discharge: 2022-12-21 | Disposition: A | Discharge status: Home / Self Care | Payer: BC Managed Care – PPO | Source: Ambulatory Visit | Attending: Obstetrics and Gynecology | Admitting: Obstetrics and Gynecology

## 2022-12-21 DIAGNOSIS — Z1231 Encounter for screening mammogram for malignant neoplasm of breast: Secondary | ICD-10-CM | POA: Diagnosis not present

## 2022-12-25 DIAGNOSIS — F4310 Post-traumatic stress disorder, unspecified: Secondary | ICD-10-CM | POA: Diagnosis not present

## 2022-12-25 DIAGNOSIS — F32A Depression, unspecified: Secondary | ICD-10-CM | POA: Diagnosis not present

## 2022-12-25 DIAGNOSIS — F419 Anxiety disorder, unspecified: Secondary | ICD-10-CM | POA: Diagnosis not present

## 2023-01-01 DIAGNOSIS — F32A Depression, unspecified: Secondary | ICD-10-CM | POA: Diagnosis not present

## 2023-01-01 DIAGNOSIS — F419 Anxiety disorder, unspecified: Secondary | ICD-10-CM | POA: Diagnosis not present

## 2023-01-01 DIAGNOSIS — F4310 Post-traumatic stress disorder, unspecified: Secondary | ICD-10-CM | POA: Diagnosis not present

## 2023-01-02 DIAGNOSIS — E119 Type 2 diabetes mellitus without complications: Secondary | ICD-10-CM | POA: Diagnosis not present

## 2023-01-08 DIAGNOSIS — F32A Depression, unspecified: Secondary | ICD-10-CM | POA: Diagnosis not present

## 2023-01-08 DIAGNOSIS — F4310 Post-traumatic stress disorder, unspecified: Secondary | ICD-10-CM | POA: Diagnosis not present

## 2023-01-08 DIAGNOSIS — F419 Anxiety disorder, unspecified: Secondary | ICD-10-CM | POA: Diagnosis not present

## 2023-01-21 ENCOUNTER — Ambulatory Visit: Payer: BC Managed Care – PPO | Attending: Cardiology | Admitting: Cardiology

## 2023-01-21 ENCOUNTER — Encounter: Payer: Self-pay | Admitting: Cardiology

## 2023-01-21 VITALS — BP 120/82 | HR 68 | Ht 66.0 in | Wt 281.0 lb

## 2023-01-21 DIAGNOSIS — I480 Paroxysmal atrial fibrillation: Secondary | ICD-10-CM

## 2023-01-21 DIAGNOSIS — G4733 Obstructive sleep apnea (adult) (pediatric): Secondary | ICD-10-CM | POA: Diagnosis not present

## 2023-01-21 NOTE — Progress Notes (Signed)
Electrophysiology Office Note   Date:  01/21/2023   ID:  Desiree Schultz, DOB Oct 09, 1970, MRN [id number]  PCP:  Marcelle Overlie, MD  Cardiologist:  Wynonia Musty Electrophysiologist: Jamse Belfast Jorja Loa, MD    No chief complaint on file.    History of Present Illness: Desiree Schultz is a 52 y.o. female who is being seen today for the evaluation of atrial fibrillaiton at the request of Rudi Coco. Presenting today for electrophysiology evaluation.    She has a history of atrial fibrillation and sleep apnea.  She is now status post atrial fibrillation ablation 01/17/2018.  Today, denies symptoms of palpitations, chest pain, shortness of breath, orthopnea, PND, lower extremity edema, claudication, dizziness, presyncope, syncope, bleeding, or neurologic sequela. The patient is tolerating medications without difficulties.  Since being seen she has done well.  She has had no further episodes of atrial fibrillation.  She is quite happy with her control.  Past Medical History:  Diagnosis Date   Allergic rhinitis    Anemia    Atrial fibrillation (HCC)    2006   Bronchial breathing    Chronic anxiety    Depression    Diabetes mellitus without complication (HCC)    Dysrhythmia    Elevated blood pressure    On therapy. Probably hypertension   Fibroid    Hx of cardiovascular stress test    a. Myoview 10/13:  no ischemia; EF 57%   Hypothyroid    Menorrhagia    2011   Obesity (BMI 30-39.9)    OSA (obstructive sleep apnea)    on CPAP   PCOS (polycystic ovarian syndrome)    PONV (postoperative nausea and vomiting)    C/o motion sickness   Prediabetes    Vitamin D deficiency    Past Surgical History:  Procedure Laterality Date   ATRIAL FIBRILLATION ABLATION N/A 01/17/2018   Procedure: ATRIAL FIBRILLATION ABLATION;  Surgeon: Regan Lemming, MD;  Location: [facility];  Service: Cardiovascular;  Laterality: N/A;   BLADDER SURGERY  1997   interstitial cystitis     BREAST REDUCTION SURGERY  06/2013   BREAST SURGERY     reduction   DILATION AND CURETTAGE, DIAGNOSTIC / THERAPEUTIC     ENDOMETRIAL ABLATION     ENDOMETRIAL ABLATION  08/2010   HAND SURGERY  2006   due to cat bite   KNEE ARTHROSCOPY WITH MEDIAL MENISECTOMY Left 04/06/2015   Procedure: LEFT KNEE ARTHROSCOPY WITH PARTIAL MEDIAL MENISCECTOMY;  Surgeon: Tarry Kos, MD;  Location: [facility];  Service: Orthopedics;  Laterality: Left;   OVARIAN CYST REMOVAL  04/28/13   REDUCTION MAMMAPLASTY Bilateral 2014   RENAL BIOPSY, OPEN  2006   TONSILLECTOMY  2007     Current Outpatient Medications  Medication Sig Dispense Refill   Blood Glucose Monitoring Suppl (FIFTY50 GLUCOSE METER 2.0) w/Device KIT Test as needed     cetirizine (ZYRTEC) 10 MG tablet Take 1 tablet (10 mg total) by mouth daily. 30 tablet 11   clotrimazole (LOTRIMIN) 1 % cream Apply topically daily in the afternoon.     escitalopram (LEXAPRO) 20 MG tablet Take 1 tablet (20 mg total) by mouth daily. 30 tablet 11   ipratropium (ATROVENT) 0.06 % nasal spray USE 2 SPRAYS IN BOTH  NOSTRILS 3 TIMES DAILY     itraconazole (SPORANOX) 100 MG capsule Take 100 mg by mouth daily. Start taking once daily for 1 week and the following week begin twice daily  levothyroxine (SYNTHROID) 137 MCG tablet Take 125 mcg by mouth daily before breakfast.     liothyronine (CYTOMEL) 5 MCG tablet Take 5 mcg by mouth daily. Pt takes 2 by mouth daily     metFORMIN (GLUCOPHAGE-XR) 500 MG 24 hr tablet Take 500 mg by mouth daily with breakfast. Pt takes 4 by mouth daily     progesterone (PROMETRIUM) 200 MG capsule Take 200 mg by mouth daily.     temazepam (RESTORIL) 15 MG capsule Take 15 mg by mouth once.     nitroGLYCERIN (NITRODUR - DOSED IN MG/24 HR) 0.2 mg/hr patch Apply 1/4th patch to affected ankle, change daily 30 patch 1   spironolactone (ALDACTONE) 50 MG tablet Take 1 tablet by mouth daily.     No current facility-administered medications for this visit.     Allergies:   Multaq [dronedarone], Benadryl [diphenhydramine hcl], Diphenhydramine, Fish oil, Folic acid, Influenza vaccines, Influenza virus vaccine, Latex, Oxycodone, Pedi-pre tape spray [wound dressing adhesive], Shellfish allergy, Shrimp extract, Tape, Vitamin b12, and Sulfa antibiotics   Social History:  The patient  reports that she quit smoking about 24 years ago. Her smoking use included cigarettes. She has a 32.00 pack-year smoking history. She has never used smokeless tobacco. She reports current alcohol use. She reports current drug use. Drug: Marijuana.   Family History:  The patient's family history includes Cardiomyopathy in her brother and father; Colon cancer in her maternal great-grandmother; Coronary artery disease in her maternal grandmother; Heart attack in her maternal grandfather and maternal grandmother; Heart disease in her father; Heart failure in her maternal great-grandmother; Hyperlipidemia in her father and maternal great-grandmother; Hypertension in her father and maternal great-grandmother.   ROS:  Please see the history of present illness.   Otherwise, review of systems is positive for none.   All other systems are reviewed and negative.   PHYSICAL EXAM: VS:  BP 120/82   Pulse 68   Ht 5\' 6"  (1.676 m)   Wt 281 lb (127.5 kg)   LMP 07/22/2019   SpO2 98%   BMI 45.35 kg/m  , BMI Body mass index is 45.35 kg/m. GEN: Well nourished, well developed, in no acute distress  HEENT: normal  Neck: no JVD, carotid bruits, or masses Cardiac: RRR; no murmurs, rubs, or gallops,no edema  Respiratory:  clear to auscultation bilaterally, normal work of breathing GI: soft, nontender, nondistended, + BS MS: no deformity or atrophy  Skin: warm and dry Neuro:  Strength and sensation are intact Psych: euthymic mood, full affect  EKG:  EKG is ordered today. Personal review of the ekg ordered shows sinus rhythm  Recent Labs: No results found for requested labs within last  365 days.    Lipid Panel     Component Value Date/Time   CHOL 198 05/21/2016 1350   TRIG 212 (H) 05/21/2016 1350   HDL 32 (L) 05/21/2016 1350   CHOLHDL 5.6 (H) 09/27/2015 0933   VLDL 37 (H) 09/27/2015 0933   LDLCALC 124 (H) 05/21/2016 1350     Wt Readings from Last 3 Encounters:  01/21/23 281 lb (127.5 kg)  08/06/22 273 lb (123.8 kg)  07/10/22 277 lb (125.6 kg)      Other studies Reviewed: Additional studies/ records that were reviewed today include: TTE 12/13/17  Review of the above records today demonstrates:  - Left ventricle: The cavity size was normal. Systolic function was   normal. The estimated ejection fraction was in the range of 50%   to 55%. Images  were inadequate for LV wall motion assessment.   Left ventricular diastolic function parameters were normal. - Tricuspid valve: There was trivial regurgitation. - Recommendations: Suggest limited study with defininty contrast to   accurately assess EF and wall motion as endocardial segments are   not well visualized.  - Left atrium:  The atrium was normal in size.  30 day monitor 07/15/18 - personally reviewed Sinus rhythm with sinus tachycardia. Symptoms of palpitations associated with sinus rhythm and rare PVCs No atrial fibrillation noted  ASSESSMENT AND PLAN:  1.  Paroxysmal atrial fibrillation: CHA2DS2-VASc of 1 and thus not anticoagulated.  Status post ablation 01/17/2018.  She has had no further episodes of atrial fibrillation.  Happy with her control.  No changes.  2.  Obstructive sleep apnea: CPAP compliance encouraged   Current medicines are reviewed at length with the patient today.   The patient does not have concerns regarding her medicines.  The following changes were made today: None  Labs/ tests ordered today include:  Orders Placed This Encounter  Procedures   EKG 12-Lead    Disposition:   FU 12 months  Signed, Kenyen Candy Jorja Loa, MD  01/21/2023 4:42 PM     [facility] [address] [place] [address] [phone] (office) [phone] (fax)

## 2023-01-22 DIAGNOSIS — F419 Anxiety disorder, unspecified: Secondary | ICD-10-CM | POA: Diagnosis not present

## 2023-01-22 DIAGNOSIS — F32A Depression, unspecified: Secondary | ICD-10-CM | POA: Diagnosis not present

## 2023-01-22 DIAGNOSIS — F4310 Post-traumatic stress disorder, unspecified: Secondary | ICD-10-CM | POA: Diagnosis not present

## 2023-02-02 DIAGNOSIS — E119 Type 2 diabetes mellitus without complications: Secondary | ICD-10-CM | POA: Diagnosis not present

## 2023-02-05 DIAGNOSIS — F4310 Post-traumatic stress disorder, unspecified: Secondary | ICD-10-CM | POA: Diagnosis not present

## 2023-02-05 DIAGNOSIS — F330 Major depressive disorder, recurrent, mild: Secondary | ICD-10-CM | POA: Diagnosis not present

## 2023-02-05 DIAGNOSIS — F419 Anxiety disorder, unspecified: Secondary | ICD-10-CM | POA: Diagnosis not present

## 2023-02-12 DIAGNOSIS — F419 Anxiety disorder, unspecified: Secondary | ICD-10-CM | POA: Diagnosis not present

## 2023-02-12 DIAGNOSIS — F4310 Post-traumatic stress disorder, unspecified: Secondary | ICD-10-CM | POA: Diagnosis not present

## 2023-02-12 DIAGNOSIS — F330 Major depressive disorder, recurrent, mild: Secondary | ICD-10-CM | POA: Diagnosis not present

## 2023-02-18 ENCOUNTER — Encounter: Payer: Self-pay | Admitting: Family Medicine

## 2023-02-19 DIAGNOSIS — F330 Major depressive disorder, recurrent, mild: Secondary | ICD-10-CM | POA: Diagnosis not present

## 2023-02-19 DIAGNOSIS — F419 Anxiety disorder, unspecified: Secondary | ICD-10-CM | POA: Diagnosis not present

## 2023-02-19 DIAGNOSIS — F4310 Post-traumatic stress disorder, unspecified: Secondary | ICD-10-CM | POA: Diagnosis not present

## 2023-02-25 ENCOUNTER — Other Ambulatory Visit: Payer: Self-pay

## 2023-02-25 ENCOUNTER — Ambulatory Visit: Payer: BC Managed Care – PPO | Admitting: Family Medicine

## 2023-02-25 VITALS — BP 128/86 | Ht 65.5 in | Wt 270.0 lb

## 2023-02-25 DIAGNOSIS — M25562 Pain in left knee: Secondary | ICD-10-CM | POA: Diagnosis not present

## 2023-02-25 MED ORDER — METHYLPREDNISOLONE ACETATE 40 MG/ML IJ SUSP
40.0000 mg | Freq: Once | INTRAMUSCULAR | Status: AC
  Administered 2023-02-25: 40 mg via INTRA_ARTICULAR

## 2023-02-26 DIAGNOSIS — F4310 Post-traumatic stress disorder, unspecified: Secondary | ICD-10-CM | POA: Diagnosis not present

## 2023-02-26 DIAGNOSIS — F419 Anxiety disorder, unspecified: Secondary | ICD-10-CM | POA: Diagnosis not present

## 2023-02-26 DIAGNOSIS — F330 Major depressive disorder, recurrent, mild: Secondary | ICD-10-CM | POA: Diagnosis not present

## 2023-02-26 NOTE — Progress Notes (Signed)
Patient returned today after discussion via mychart message to try aspiration and/or injection of her baker's cyst.  Left knee has been bothersome especially posteriorly and trying to straighten knee.  Working on weight loss so she can have knee replacement.  Baker's cyst visualized on ultrasound and currently small - would not recommend aspirating but this was injected as below.  After informed written consent timeout was performed, patient was lying prone on exam table. Left knee was prepped with alcohol swab and utilizing ultrasound guidance, patient's left knee baker's cyst was injected with 3:1 lidocaine: depomedrol. Patient tolerated the procedure well without immediate complications.

## 2023-03-04 DIAGNOSIS — E119 Type 2 diabetes mellitus without complications: Secondary | ICD-10-CM | POA: Diagnosis not present

## 2023-03-05 DIAGNOSIS — F419 Anxiety disorder, unspecified: Secondary | ICD-10-CM | POA: Diagnosis not present

## 2023-03-05 DIAGNOSIS — F4310 Post-traumatic stress disorder, unspecified: Secondary | ICD-10-CM | POA: Diagnosis not present

## 2023-03-05 DIAGNOSIS — F330 Major depressive disorder, recurrent, mild: Secondary | ICD-10-CM | POA: Diagnosis not present

## 2023-03-12 ENCOUNTER — Ambulatory Visit: Payer: BC Managed Care – PPO | Admitting: Sports Medicine

## 2023-03-12 ENCOUNTER — Encounter: Payer: Self-pay | Admitting: Sports Medicine

## 2023-03-12 VITALS — BP 128/82 | Ht 65.5 in | Wt 270.0 lb

## 2023-03-12 DIAGNOSIS — F419 Anxiety disorder, unspecified: Secondary | ICD-10-CM | POA: Diagnosis not present

## 2023-03-12 DIAGNOSIS — F4310 Post-traumatic stress disorder, unspecified: Secondary | ICD-10-CM | POA: Diagnosis not present

## 2023-03-12 DIAGNOSIS — M23322 Other meniscus derangements, posterior horn of medial meniscus, left knee: Secondary | ICD-10-CM | POA: Diagnosis not present

## 2023-03-12 DIAGNOSIS — M25562 Pain in left knee: Secondary | ICD-10-CM | POA: Diagnosis not present

## 2023-03-12 DIAGNOSIS — F330 Major depressive disorder, recurrent, mild: Secondary | ICD-10-CM | POA: Diagnosis not present

## 2023-03-12 MED ORDER — DICLOFENAC SODIUM 75 MG PO TBEC
75.0000 mg | DELAYED_RELEASE_TABLET | Freq: Two times a day (BID) | ORAL | 0 refills | Status: DC
Start: 2023-03-12 — End: 2023-04-24

## 2023-03-12 NOTE — Progress Notes (Addendum)
PCP: Marcelle Overlie, MD  Subjective:   HPI: Patient is a 52 y.o. female here for Left knee pain.  Patient was seen relatively recently and at that time had a notable Baker's cyst which was small in size.  Patient had a steroid injection placed into the Baker's cyst.  Patient states that she felt great after the injection and had no problems. Patient had an MRI of the left knee on 09/01/2022.  MRI showed complex degenerative tearing of the posterior horn and body of the medial meniscus with loss and medial extrusion. Patient states that she has seen an orthopedic surgeon in the past and has had a meniscectomy.  Patient states that she went to with surgeon again this year in February who stated that she would not benefit from a repeat meniscectomy.  Patient states that this past Saturday she went to a concert and was sitting in a lawn chair and then she noticed left-sided knee pain and tightness in the back of her knee.  Patient states that the pain increased on Sunday and increased even more on Monday to the point where she had difficulty walking up and down stairs.  Patient said the pain is persistent.  Patient states that the pain is present whenever she walks and that she feels a persistent tightness in the posterior area of her left leg whenever she is walking.  Patient states that it has come to the point that she can barely walk.  Patient also notes that she has been taking aspirin for the pain.  Patient has not taken any other anti-inflammatories at this time.   Past Medical History:  Diagnosis Date   Allergic rhinitis    Anemia    Atrial fibrillation (HCC)    2006   Bronchial breathing    Chronic anxiety    Depression    Diabetes mellitus without complication (HCC)    Dysrhythmia    Elevated blood pressure    On therapy. Probably hypertension   Fibroid    Hx of cardiovascular stress test    a. Myoview 10/13:  no ischemia; EF 57%   Hypothyroid    Menorrhagia    2011   Obesity  (BMI 30-39.9)    OSA (obstructive sleep apnea)    on CPAP   PCOS (polycystic ovarian syndrome)    PONV (postoperative nausea and vomiting)    C/o motion sickness   Prediabetes    Vitamin D deficiency     Current Outpatient Medications on File Prior to Visit  Medication Sig Dispense Refill   Blood Glucose Monitoring Suppl (FIFTY50 GLUCOSE METER 2.0) w/Device KIT Test as needed     cetirizine (ZYRTEC) 10 MG tablet Take 1 tablet (10 mg total) by mouth daily. 30 tablet 11   clotrimazole (LOTRIMIN) 1 % cream Apply topically daily in the afternoon.     escitalopram (LEXAPRO) 20 MG tablet Take 1 tablet (20 mg total) by mouth daily. 30 tablet 11   ipratropium (ATROVENT) 0.06 % nasal spray USE 2 SPRAYS IN BOTH  NOSTRILS 3 TIMES DAILY     itraconazole (SPORANOX) 100 MG capsule Take 100 mg by mouth daily. Start taking once daily for 1 week and the following week begin twice daily     levothyroxine (SYNTHROID) 137 MCG tablet Take 125 mcg by mouth daily before breakfast.     liothyronine (CYTOMEL) 5 MCG tablet Take 5 mcg by mouth daily. Pt takes 2 by mouth daily     metFORMIN (GLUCOPHAGE-XR) 500 MG 24  hr tablet Take 500 mg by mouth daily with breakfast. Pt takes 4 by mouth daily     nitroGLYCERIN (NITRODUR - DOSED IN MG/24 HR) 0.2 mg/hr patch Apply 1/4th patch to affected ankle, change daily 30 patch 1   progesterone (PROMETRIUM) 200 MG capsule Take 200 mg by mouth daily.     spironolactone (ALDACTONE) 50 MG tablet Take 1 tablet by mouth daily.     temazepam (RESTORIL) 15 MG capsule Take 15 mg by mouth once.     No current facility-administered medications on file prior to visit.    Past Surgical History:  Procedure Laterality Date   ATRIAL FIBRILLATION ABLATION N/A 01/17/2018   Procedure: ATRIAL FIBRILLATION ABLATION;  Surgeon: Regan Lemming, MD;  Location: [facility];  Service: Cardiovascular;  Laterality: N/A;   BLADDER SURGERY  1997   interstitial cystitis    BREAST REDUCTION  SURGERY  06/2013   BREAST SURGERY     reduction   DILATION AND CURETTAGE, DIAGNOSTIC / THERAPEUTIC     ENDOMETRIAL ABLATION     ENDOMETRIAL ABLATION  08/2010   HAND SURGERY  2006   due to cat bite   KNEE ARTHROSCOPY WITH MEDIAL MENISECTOMY Left 04/06/2015   Procedure: LEFT KNEE ARTHROSCOPY WITH PARTIAL MEDIAL MENISCECTOMY;  Surgeon: Tarry Kos, MD;  Location: [facility];  Service: Orthopedics;  Laterality: Left;   OVARIAN CYST REMOVAL  04/28/13   REDUCTION MAMMAPLASTY Bilateral 2014   RENAL BIOPSY, OPEN  2006   TONSILLECTOMY  2007    Allergies  Allergen Reactions   Multaq [Dronedarone] Swelling    Per patient   Benadryl [Diphenhydramine Hcl] Itching   Diphenhydramine Itching   Fish Oil     Makes hands raw, mouth and female area  Other reaction(s): Other   Folic Acid    Influenza Vaccines     Gives pt the flu and site of injection swells and hot   Influenza Virus Vaccine     Other reaction(s): Other   Latex     Burns skin   Oxycodone Nausea And Vomiting   Pedi-Pre Tape Spray [Wound Dressing Adhesive] Itching    Burns skin, band-aid, ekg stickers  Burns skin, band-aid, ekg stickers   Shellfish Allergy     Other reaction(s): Contact Dermatitis (intolerance)   Shrimp Extract    Tape     Burns skin, band-aid, ekg stickers   Vitamin B12    Sulfa Antibiotics Rash    Other reaction(s): Other    BP 128/82   Ht 5' 5.5" (1.664 m)   Wt 270 lb (122.5 kg)   LMP 07/22/2019   BMI 44.25 kg/m       No data to display              No data to display              Objective:  Physical Exam:  Gen: NAD, comfortable in exam room Knee, Left:  Inspection was negative for erythema, ecchymosis, and effusion. No obvious bony abnormalities or signs of osteophyte development. There is joint line tenderness to palpation in the medial joint line towards the posterior, no warmth noted; No condyle tenderness; No patellar tenderness; No noted knee crepitus. Patellar and quadriceps  tendons unremarkable, and no tenderness of the pes anserine bursa. No obvious Baker's cyst development. ROM is slightly normal in flexion (135 degrees) and slightly decrease with extension. (10-15 degrees). Strength 5/5 with knee flexion and 4/5 w/ extension . Neurovascularly intact bilaterally.  Assessment & Plan:  1. 1. Left knee pain, unspecified chronicity   2. 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 Patient's pain is likely not related to the Baker's cyst given its size.   - The patient is seen a orthopedic surgeon in February of this year who did not recommend repeat meniscectomy, it is recommended that patient would benefit from seeing a total joint replacement orthopedic specialist as they would be able to further direct her care.  In the meantime, patient was recommended to use a compression sleeve over the left knee and also use crutches.  Patient elected to use a walker instead.  Patient would also benefit from taking an anti-inflammatory.  Will do Voltaren oral 75 mg twice a day for the next 5 days. -Patient was advised to follow-up with refueling orthopedics total joint replacement specialist physician. Patient will follow up with Dr. Stevphen Rochester  -Will give patient off work today and tomorrow.  Patient seen and evaluated with the sports medicine fellow.  I agree with the above plan of care.  Patient has an MRI with findings of medial compartmental DJD as well as an extensive medial meniscal tear.  I am unsure whether or not she would benefit from an arthroscopy but I think a referral to Dr. Stevphen Rochester would be helpful.  In the meantime, we will treat as above and I will defer further workup and treatment to the discretion of orthopedic surgery.

## 2023-03-18 DIAGNOSIS — M9903 Segmental and somatic dysfunction of lumbar region: Secondary | ICD-10-CM | POA: Diagnosis not present

## 2023-03-18 DIAGNOSIS — M9901 Segmental and somatic dysfunction of cervical region: Secondary | ICD-10-CM | POA: Diagnosis not present

## 2023-03-18 DIAGNOSIS — M9905 Segmental and somatic dysfunction of pelvic region: Secondary | ICD-10-CM | POA: Diagnosis not present

## 2023-03-18 DIAGNOSIS — M9902 Segmental and somatic dysfunction of thoracic region: Secondary | ICD-10-CM | POA: Diagnosis not present

## 2023-03-18 DIAGNOSIS — M9906 Segmental and somatic dysfunction of lower extremity: Secondary | ICD-10-CM | POA: Diagnosis not present

## 2023-03-26 DIAGNOSIS — F4310 Post-traumatic stress disorder, unspecified: Secondary | ICD-10-CM | POA: Diagnosis not present

## 2023-03-26 DIAGNOSIS — F339 Major depressive disorder, recurrent, unspecified: Secondary | ICD-10-CM | POA: Diagnosis not present

## 2023-04-02 DIAGNOSIS — F4310 Post-traumatic stress disorder, unspecified: Secondary | ICD-10-CM | POA: Diagnosis not present

## 2023-04-02 DIAGNOSIS — F330 Major depressive disorder, recurrent, mild: Secondary | ICD-10-CM | POA: Diagnosis not present

## 2023-04-04 DIAGNOSIS — E119 Type 2 diabetes mellitus without complications: Secondary | ICD-10-CM | POA: Diagnosis not present

## 2023-04-08 DIAGNOSIS — M9905 Segmental and somatic dysfunction of pelvic region: Secondary | ICD-10-CM | POA: Diagnosis not present

## 2023-04-08 DIAGNOSIS — M9901 Segmental and somatic dysfunction of cervical region: Secondary | ICD-10-CM | POA: Diagnosis not present

## 2023-04-08 DIAGNOSIS — M9906 Segmental and somatic dysfunction of lower extremity: Secondary | ICD-10-CM | POA: Diagnosis not present

## 2023-04-08 DIAGNOSIS — M9902 Segmental and somatic dysfunction of thoracic region: Secondary | ICD-10-CM | POA: Diagnosis not present

## 2023-04-08 DIAGNOSIS — M9903 Segmental and somatic dysfunction of lumbar region: Secondary | ICD-10-CM | POA: Diagnosis not present

## 2023-04-09 DIAGNOSIS — F4310 Post-traumatic stress disorder, unspecified: Secondary | ICD-10-CM | POA: Diagnosis not present

## 2023-04-09 DIAGNOSIS — F330 Major depressive disorder, recurrent, mild: Secondary | ICD-10-CM | POA: Diagnosis not present

## 2023-04-15 DIAGNOSIS — E119 Type 2 diabetes mellitus without complications: Secondary | ICD-10-CM | POA: Diagnosis not present

## 2023-04-15 DIAGNOSIS — E538 Deficiency of other specified B group vitamins: Secondary | ICD-10-CM | POA: Diagnosis not present

## 2023-04-15 DIAGNOSIS — E063 Autoimmune thyroiditis: Secondary | ICD-10-CM | POA: Diagnosis not present

## 2023-04-15 DIAGNOSIS — E669 Obesity, unspecified: Secondary | ICD-10-CM | POA: Diagnosis not present

## 2023-04-15 DIAGNOSIS — E7211 Homocystinuria: Secondary | ICD-10-CM | POA: Diagnosis not present

## 2023-04-15 DIAGNOSIS — G4730 Sleep apnea, unspecified: Secondary | ICD-10-CM | POA: Diagnosis not present

## 2023-04-15 DIAGNOSIS — E7219 Other disorders of sulfur-bearing amino-acid metabolism: Secondary | ICD-10-CM | POA: Diagnosis not present

## 2023-04-15 DIAGNOSIS — E039 Hypothyroidism, unspecified: Secondary | ICD-10-CM | POA: Diagnosis not present

## 2023-04-16 DIAGNOSIS — F4310 Post-traumatic stress disorder, unspecified: Secondary | ICD-10-CM | POA: Diagnosis not present

## 2023-04-16 DIAGNOSIS — F330 Major depressive disorder, recurrent, mild: Secondary | ICD-10-CM | POA: Diagnosis not present

## 2023-04-22 ENCOUNTER — Other Ambulatory Visit: Payer: Self-pay | Admitting: Sports Medicine

## 2023-04-22 DIAGNOSIS — M9906 Segmental and somatic dysfunction of lower extremity: Secondary | ICD-10-CM | POA: Diagnosis not present

## 2023-04-22 DIAGNOSIS — M9901 Segmental and somatic dysfunction of cervical region: Secondary | ICD-10-CM | POA: Diagnosis not present

## 2023-04-22 DIAGNOSIS — M9902 Segmental and somatic dysfunction of thoracic region: Secondary | ICD-10-CM | POA: Diagnosis not present

## 2023-04-22 DIAGNOSIS — M9903 Segmental and somatic dysfunction of lumbar region: Secondary | ICD-10-CM | POA: Diagnosis not present

## 2023-04-22 DIAGNOSIS — M9905 Segmental and somatic dysfunction of pelvic region: Secondary | ICD-10-CM | POA: Diagnosis not present

## 2023-04-23 DIAGNOSIS — F330 Major depressive disorder, recurrent, mild: Secondary | ICD-10-CM | POA: Diagnosis not present

## 2023-04-23 DIAGNOSIS — F4310 Post-traumatic stress disorder, unspecified: Secondary | ICD-10-CM | POA: Diagnosis not present

## 2023-04-24 ENCOUNTER — Other Ambulatory Visit: Payer: Self-pay | Admitting: *Deleted

## 2023-04-24 MED ORDER — DICLOFENAC SODIUM 75 MG PO TBEC: 75.0000 mg | DELAYED_RELEASE_TABLET | Freq: Two times a day (BID) | ORAL | 0 refills | Status: DC

## 2023-04-26 DIAGNOSIS — R5383 Other fatigue: Secondary | ICD-10-CM | POA: Diagnosis not present

## 2023-04-26 DIAGNOSIS — Z77120 Contact with and (suspected) exposure to mold (toxic): Secondary | ICD-10-CM | POA: Diagnosis not present

## 2023-04-26 DIAGNOSIS — E119 Type 2 diabetes mellitus without complications: Secondary | ICD-10-CM | POA: Diagnosis not present

## 2023-04-26 DIAGNOSIS — E063 Autoimmune thyroiditis: Secondary | ICD-10-CM | POA: Diagnosis not present

## 2023-04-30 DIAGNOSIS — F330 Major depressive disorder, recurrent, mild: Secondary | ICD-10-CM | POA: Diagnosis not present

## 2023-04-30 DIAGNOSIS — F4310 Post-traumatic stress disorder, unspecified: Secondary | ICD-10-CM | POA: Diagnosis not present

## 2023-05-05 DIAGNOSIS — E119 Type 2 diabetes mellitus without complications: Secondary | ICD-10-CM | POA: Diagnosis not present

## 2023-05-08 DIAGNOSIS — M9905 Segmental and somatic dysfunction of pelvic region: Secondary | ICD-10-CM | POA: Diagnosis not present

## 2023-05-08 DIAGNOSIS — M9901 Segmental and somatic dysfunction of cervical region: Secondary | ICD-10-CM | POA: Diagnosis not present

## 2023-05-08 DIAGNOSIS — M9906 Segmental and somatic dysfunction of lower extremity: Secondary | ICD-10-CM | POA: Diagnosis not present

## 2023-05-08 DIAGNOSIS — M9903 Segmental and somatic dysfunction of lumbar region: Secondary | ICD-10-CM | POA: Diagnosis not present

## 2023-05-08 DIAGNOSIS — M9902 Segmental and somatic dysfunction of thoracic region: Secondary | ICD-10-CM | POA: Diagnosis not present

## 2023-05-14 DIAGNOSIS — F4310 Post-traumatic stress disorder, unspecified: Secondary | ICD-10-CM | POA: Diagnosis not present

## 2023-05-14 DIAGNOSIS — F330 Major depressive disorder, recurrent, mild: Secondary | ICD-10-CM | POA: Diagnosis not present

## 2023-05-16 DIAGNOSIS — Z23 Encounter for immunization: Secondary | ICD-10-CM | POA: Diagnosis not present

## 2023-05-16 DIAGNOSIS — G4733 Obstructive sleep apnea (adult) (pediatric): Secondary | ICD-10-CM | POA: Diagnosis not present

## 2023-05-28 DIAGNOSIS — F330 Major depressive disorder, recurrent, mild: Secondary | ICD-10-CM | POA: Diagnosis not present

## 2023-05-28 DIAGNOSIS — F4310 Post-traumatic stress disorder, unspecified: Secondary | ICD-10-CM | POA: Diagnosis not present

## 2023-05-31 ENCOUNTER — Other Ambulatory Visit: Payer: Self-pay | Admitting: *Deleted

## 2023-05-31 MED ORDER — DICLOFENAC SODIUM 75 MG PO TBEC: 75.0000 mg | DELAYED_RELEASE_TABLET | Freq: Two times a day (BID) | ORAL | 0 refills | Status: DC

## 2023-06-01 NOTE — Progress Notes (Signed)
Seen by casting department

## 2023-06-04 DIAGNOSIS — F4310 Post-traumatic stress disorder, unspecified: Secondary | ICD-10-CM | POA: Diagnosis not present

## 2023-06-04 DIAGNOSIS — E119 Type 2 diabetes mellitus without complications: Secondary | ICD-10-CM | POA: Diagnosis not present

## 2023-06-04 DIAGNOSIS — F330 Major depressive disorder, recurrent, mild: Secondary | ICD-10-CM | POA: Diagnosis not present

## 2023-06-18 DIAGNOSIS — F330 Major depressive disorder, recurrent, mild: Secondary | ICD-10-CM | POA: Diagnosis not present

## 2023-06-18 DIAGNOSIS — F4310 Post-traumatic stress disorder, unspecified: Secondary | ICD-10-CM | POA: Diagnosis not present

## 2023-06-25 DIAGNOSIS — F4310 Post-traumatic stress disorder, unspecified: Secondary | ICD-10-CM | POA: Diagnosis not present

## 2023-06-25 DIAGNOSIS — F330 Major depressive disorder, recurrent, mild: Secondary | ICD-10-CM | POA: Diagnosis not present

## 2023-07-01 DIAGNOSIS — M9902 Segmental and somatic dysfunction of thoracic region: Secondary | ICD-10-CM | POA: Diagnosis not present

## 2023-07-01 DIAGNOSIS — M9901 Segmental and somatic dysfunction of cervical region: Secondary | ICD-10-CM | POA: Diagnosis not present

## 2023-07-01 DIAGNOSIS — M9903 Segmental and somatic dysfunction of lumbar region: Secondary | ICD-10-CM | POA: Diagnosis not present

## 2023-07-01 DIAGNOSIS — M9906 Segmental and somatic dysfunction of lower extremity: Secondary | ICD-10-CM | POA: Diagnosis not present

## 2023-07-01 DIAGNOSIS — M9905 Segmental and somatic dysfunction of pelvic region: Secondary | ICD-10-CM | POA: Diagnosis not present

## 2023-07-02 DIAGNOSIS — F4310 Post-traumatic stress disorder, unspecified: Secondary | ICD-10-CM | POA: Diagnosis not present

## 2023-07-02 DIAGNOSIS — F330 Major depressive disorder, recurrent, mild: Secondary | ICD-10-CM | POA: Diagnosis not present

## 2023-07-05 DIAGNOSIS — E119 Type 2 diabetes mellitus without complications: Secondary | ICD-10-CM | POA: Diagnosis not present

## 2023-07-09 DIAGNOSIS — F330 Major depressive disorder, recurrent, mild: Secondary | ICD-10-CM | POA: Diagnosis not present

## 2023-07-09 DIAGNOSIS — F4310 Post-traumatic stress disorder, unspecified: Secondary | ICD-10-CM | POA: Diagnosis not present

## 2023-07-10 DIAGNOSIS — E7219 Other disorders of sulfur-bearing amino-acid metabolism: Secondary | ICD-10-CM | POA: Diagnosis not present

## 2023-07-10 DIAGNOSIS — G4730 Sleep apnea, unspecified: Secondary | ICD-10-CM | POA: Diagnosis not present

## 2023-07-10 DIAGNOSIS — E119 Type 2 diabetes mellitus without complications: Secondary | ICD-10-CM | POA: Diagnosis not present

## 2023-07-10 DIAGNOSIS — E669 Obesity, unspecified: Secondary | ICD-10-CM | POA: Diagnosis not present

## 2023-07-10 DIAGNOSIS — R7303 Prediabetes: Secondary | ICD-10-CM | POA: Diagnosis not present

## 2023-07-10 DIAGNOSIS — F32A Depression, unspecified: Secondary | ICD-10-CM | POA: Diagnosis not present

## 2023-07-10 DIAGNOSIS — R4189 Other symptoms and signs involving cognitive functions and awareness: Secondary | ICD-10-CM | POA: Diagnosis not present

## 2023-07-10 DIAGNOSIS — E063 Autoimmune thyroiditis: Secondary | ICD-10-CM | POA: Diagnosis not present

## 2023-07-10 DIAGNOSIS — E039 Hypothyroidism, unspecified: Secondary | ICD-10-CM | POA: Diagnosis not present

## 2023-07-10 DIAGNOSIS — E538 Deficiency of other specified B group vitamins: Secondary | ICD-10-CM | POA: Diagnosis not present

## 2023-07-10 DIAGNOSIS — E7211 Homocystinuria: Secondary | ICD-10-CM | POA: Diagnosis not present

## 2023-07-16 DIAGNOSIS — F330 Major depressive disorder, recurrent, mild: Secondary | ICD-10-CM | POA: Diagnosis not present

## 2023-07-16 DIAGNOSIS — F4310 Post-traumatic stress disorder, unspecified: Secondary | ICD-10-CM | POA: Diagnosis not present

## 2023-07-18 DIAGNOSIS — F331 Major depressive disorder, recurrent, moderate: Secondary | ICD-10-CM | POA: Diagnosis not present

## 2023-07-19 DIAGNOSIS — E063 Autoimmune thyroiditis: Secondary | ICD-10-CM | POA: Diagnosis not present

## 2023-07-19 DIAGNOSIS — B488 Other specified mycoses: Secondary | ICD-10-CM | POA: Diagnosis not present

## 2023-07-19 DIAGNOSIS — N951 Menopausal and female climacteric states: Secondary | ICD-10-CM | POA: Diagnosis not present

## 2023-07-19 DIAGNOSIS — B351 Tinea unguium: Secondary | ICD-10-CM | POA: Diagnosis not present

## 2023-07-23 DIAGNOSIS — F4310 Post-traumatic stress disorder, unspecified: Secondary | ICD-10-CM | POA: Diagnosis not present

## 2023-07-23 DIAGNOSIS — F330 Major depressive disorder, recurrent, mild: Secondary | ICD-10-CM | POA: Diagnosis not present

## 2023-07-25 DIAGNOSIS — F331 Major depressive disorder, recurrent, moderate: Secondary | ICD-10-CM | POA: Diagnosis not present

## 2023-07-29 DIAGNOSIS — F331 Major depressive disorder, recurrent, moderate: Secondary | ICD-10-CM | POA: Diagnosis not present

## 2023-08-04 DIAGNOSIS — E119 Type 2 diabetes mellitus without complications: Secondary | ICD-10-CM | POA: Diagnosis not present

## 2023-08-05 DIAGNOSIS — F331 Major depressive disorder, recurrent, moderate: Secondary | ICD-10-CM | POA: Diagnosis not present

## 2023-08-06 DIAGNOSIS — F4310 Post-traumatic stress disorder, unspecified: Secondary | ICD-10-CM | POA: Diagnosis not present

## 2023-08-06 DIAGNOSIS — F330 Major depressive disorder, recurrent, mild: Secondary | ICD-10-CM | POA: Diagnosis not present

## 2023-08-14 DIAGNOSIS — M9901 Segmental and somatic dysfunction of cervical region: Secondary | ICD-10-CM | POA: Diagnosis not present

## 2023-08-14 DIAGNOSIS — M9906 Segmental and somatic dysfunction of lower extremity: Secondary | ICD-10-CM | POA: Diagnosis not present

## 2023-08-14 DIAGNOSIS — M9902 Segmental and somatic dysfunction of thoracic region: Secondary | ICD-10-CM | POA: Diagnosis not present

## 2023-08-14 DIAGNOSIS — M9905 Segmental and somatic dysfunction of pelvic region: Secondary | ICD-10-CM | POA: Diagnosis not present

## 2023-08-14 DIAGNOSIS — M9903 Segmental and somatic dysfunction of lumbar region: Secondary | ICD-10-CM | POA: Diagnosis not present

## 2023-08-15 DIAGNOSIS — F331 Major depressive disorder, recurrent, moderate: Secondary | ICD-10-CM | POA: Diagnosis not present

## 2023-08-16 ENCOUNTER — Ambulatory Visit
Admission: EM | Admit: 2023-08-16 | Discharge: 2023-08-16 | Disposition: A | Discharge status: Home / Self Care | Payer: BC Managed Care – PPO | Attending: Family Medicine | Admitting: Family Medicine

## 2023-08-16 ENCOUNTER — Ambulatory Visit: Payer: BC Managed Care – PPO

## 2023-08-16 DIAGNOSIS — M25561 Pain in right knee: Secondary | ICD-10-CM | POA: Diagnosis not present

## 2023-08-16 DIAGNOSIS — S161XXA Strain of muscle, fascia and tendon at neck level, initial encounter: Secondary | ICD-10-CM

## 2023-08-16 DIAGNOSIS — R059 Cough, unspecified: Secondary | ICD-10-CM | POA: Diagnosis not present

## 2023-08-16 DIAGNOSIS — M5441 Lumbago with sciatica, right side: Secondary | ICD-10-CM

## 2023-08-16 DIAGNOSIS — R062 Wheezing: Secondary | ICD-10-CM

## 2023-08-16 MED ORDER — ALBUTEROL SULFATE HFA 108 (90 BASE) MCG/ACT IN AERS: 2.0000 | INHALATION_SPRAY | RESPIRATORY_TRACT | 0 refills | Status: AC | PRN

## 2023-08-16 MED ORDER — METHOCARBAMOL 500 MG PO TABS: 500.0000 mg | ORAL_TABLET | Freq: Two times a day (BID) | ORAL | 0 refills | Status: AC

## 2023-08-16 MED ORDER — DICLOFENAC SODIUM 75 MG PO TBEC: 75.0000 mg | DELAYED_RELEASE_TABLET | Freq: Two times a day (BID) | ORAL | 0 refills | Status: AC

## 2023-08-16 MED ORDER — PREDNISONE 50 MG PO TABS
50.0000 mg | ORAL_TABLET | Freq: Every day | ORAL | 0 refills | Status: AC
Start: 2023-08-16 — End: 2023-08-19
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
## 2023-08-22 DIAGNOSIS — F331 Major depressive disorder, recurrent, moderate: Secondary | ICD-10-CM | POA: Diagnosis not present

## 2023-08-29 DIAGNOSIS — F331 Major depressive disorder, recurrent, moderate: Secondary | ICD-10-CM | POA: Diagnosis not present

## 2023-09-04 DIAGNOSIS — E119 Type 2 diabetes mellitus without complications: Secondary | ICD-10-CM | POA: Diagnosis not present

## 2023-09-06 DIAGNOSIS — F331 Major depressive disorder, recurrent, moderate: Secondary | ICD-10-CM | POA: Diagnosis not present

## 2023-09-12 ENCOUNTER — Other Ambulatory Visit: Payer: Self-pay | Admitting: Obstetrics and Gynecology

## 2023-09-12 DIAGNOSIS — Z1231 Encounter for screening mammogram for malignant neoplasm of breast: Secondary | ICD-10-CM

## 2023-09-12 DIAGNOSIS — F331 Major depressive disorder, recurrent, moderate: Secondary | ICD-10-CM | POA: Diagnosis not present

## 2023-09-16 ENCOUNTER — Encounter: Payer: Self-pay | Admitting: Family Medicine

## 2023-09-17 DIAGNOSIS — F331 Major depressive disorder, recurrent, moderate: Secondary | ICD-10-CM | POA: Diagnosis not present

## 2023-09-20 NOTE — Telephone Encounter (Signed)
Spoke with patient.  There's a Careers adviser in [place] who could perform arthroscopy through the insurance program she has at no cost to her and she's looking into this.  Concerned that with the MRI being too old that they want a new one before making that decision.  She's had them send images via [facility] but they noted they never received the disc.  She planned to drive the disc down there.  They are going to review images and let her know if arthroscopy is an option.  I advised I don't think arthroscopy would buy her much if any time for a knee replacement.  She's down to only needing to lose 16 pounds before she can have replacement.  Congratulated her and encouraged her in this.  She's going to think about it and let us know what she'd like to do - can order a repeat MRI for her if that's the direction she would like to go.

## 2023-09-26 DIAGNOSIS — F331 Major depressive disorder, recurrent, moderate: Secondary | ICD-10-CM | POA: Diagnosis not present

## 2023-10-01 DIAGNOSIS — F331 Major depressive disorder, recurrent, moderate: Secondary | ICD-10-CM | POA: Diagnosis not present

## 2023-10-05 DIAGNOSIS — E119 Type 2 diabetes mellitus without complications: Secondary | ICD-10-CM | POA: Diagnosis not present

## 2023-10-10 DIAGNOSIS — F331 Major depressive disorder, recurrent, moderate: Secondary | ICD-10-CM | POA: Diagnosis not present

## 2023-10-14 DIAGNOSIS — F331 Major depressive disorder, recurrent, moderate: Secondary | ICD-10-CM | POA: Diagnosis not present

## 2023-10-21 DIAGNOSIS — F331 Major depressive disorder, recurrent, moderate: Secondary | ICD-10-CM | POA: Diagnosis not present

## 2023-11-02 DIAGNOSIS — E119 Type 2 diabetes mellitus without complications: Secondary | ICD-10-CM | POA: Diagnosis not present

## 2023-11-04 DIAGNOSIS — F331 Major depressive disorder, recurrent, moderate: Secondary | ICD-10-CM | POA: Diagnosis not present

## 2023-11-11 DIAGNOSIS — F331 Major depressive disorder, recurrent, moderate: Secondary | ICD-10-CM | POA: Diagnosis not present

## 2023-11-18 DIAGNOSIS — F331 Major depressive disorder, recurrent, moderate: Secondary | ICD-10-CM | POA: Diagnosis not present

## 2023-11-25 DIAGNOSIS — M9906 Segmental and somatic dysfunction of lower extremity: Secondary | ICD-10-CM | POA: Diagnosis not present

## 2023-11-25 DIAGNOSIS — M9905 Segmental and somatic dysfunction of pelvic region: Secondary | ICD-10-CM | POA: Diagnosis not present

## 2023-11-25 DIAGNOSIS — M9901 Segmental and somatic dysfunction of cervical region: Secondary | ICD-10-CM | POA: Diagnosis not present

## 2023-11-25 DIAGNOSIS — M9903 Segmental and somatic dysfunction of lumbar region: Secondary | ICD-10-CM | POA: Diagnosis not present

## 2023-11-25 DIAGNOSIS — M9902 Segmental and somatic dysfunction of thoracic region: Secondary | ICD-10-CM | POA: Diagnosis not present

## 2023-11-25 DIAGNOSIS — F331 Major depressive disorder, recurrent, moderate: Secondary | ICD-10-CM | POA: Diagnosis not present

## 2023-12-02 ENCOUNTER — Ambulatory Visit
Admission: EM | Admit: 2023-12-02 | Discharge: 2023-12-02 | Disposition: A | Discharge status: Home / Self Care | Attending: Family Medicine | Admitting: Family Medicine

## 2023-12-02 DIAGNOSIS — J069 Acute upper respiratory infection, unspecified: Secondary | ICD-10-CM | POA: Insufficient documentation

## 2023-12-02 DIAGNOSIS — J302 Other seasonal allergic rhinitis: Secondary | ICD-10-CM | POA: Diagnosis not present

## 2023-12-02 LAB — URINALYSIS, W/ REFLEX TO CULTURE (INFECTION SUSPECTED)
Bilirubin Urine: NEGATIVE
Glucose, UA: NEGATIVE mg/dL
Hgb urine dipstick: NEGATIVE
Ketones, ur: NEGATIVE mg/dL
Leukocytes,Ua: NEGATIVE
Nitrite: NEGATIVE
Protein, ur: NEGATIVE mg/dL
Specific Gravity, Urine: <1.005 — ABNORMAL LOW (ref 1.005–1.030)
pH: 6 (ref 5.0–8.0)

## 2023-12-02 MED ORDER — DEXAMETHASONE SODIUM PHOSPHATE 10 MG/ML IJ SOLN
10.0000 mg | Freq: Once | INTRAMUSCULAR | Status: AC
  Administered 2023-12-02: 10 mg via INTRAMUSCULAR

## 2023-12-02 MED ORDER — FLUTICASONE PROPIONATE 50 MCG/ACT NA SUSP
2.0000 | Freq: Every day | NASAL | 2 refills | Status: AC
Start: 2023-12-02 — End: ?

## 2023-12-02 MED ORDER — MONTELUKAST SODIUM 10 MG PO TABS: 10.0000 mg | ORAL_TABLET | Freq: Every day | ORAL | 2 refills | Status: AC

## 2023-12-02 NOTE — ED Provider Notes (Signed)
 [facility]    CSN: [id number] Arrival date & time: 12/02/23  1618      History   Chief Complaint Chief Complaint  Patient presents with   Cough    HPI Desiree Schultz is a 53 y.o. female.   HPI  History obtained from the patient. [place] presents for cough, fatigue, rhinorrhea, nasal congestion, post nasal drip,     My sinuses are on fire.   Has some urianry frequency.     Fever : no  Chills: no Sore throat: no   Cough: no Sputum: no Chest tightness: no Shortness of breath: no Wheezing: no  Nasal congestion : no  Rhinorrhea: no Myalgias: no Appetite: normal  Hydration: normal  Abdominal pain: no Nausea: no Vomiting: no Diarrhea: No Rash: No Sleep disturbance: no Headache: no      Past Medical History:  Diagnosis Date   Allergic rhinitis    Anemia    Atrial fibrillation (HCC)    2006   Bronchial breathing    Chronic anxiety    Depression    Diabetes mellitus without complication (HCC)    Dysrhythmia    Elevated blood pressure    On therapy. Probably hypertension   Fibroid    Hx of cardiovascular stress test    a. Myoview 10/13:  no ischemia; EF 57%   Hypothyroid    Menorrhagia    2011   Obesity (BMI 30-39.9)    OSA (obstructive sleep apnea)    on CPAP   PCOS (polycystic ovarian syndrome)    PONV (postoperative nausea and vomiting)    C/o motion sickness   Prediabetes    Vitamin D deficiency     Patient Active Problem List   Diagnosis Date Noted   Paroxysmal A-fib (HCC) 01/17/2018   Chronic pain of right knee 05/28/2017   Controlled type 2 diabetes mellitus without complication, without long-term current use of insulin (HCC) 08/30/2016   Menopause 08/30/2016   Metabolic syndrome 12/27/2015   Bilateral edema of lower extremity 09/27/2015   Proteinuria of undiagnosed cause 09/27/2015   Dorsocervical fat pad 08/24/2015   Prediabetes 02/10/2015   Left knee pain 12/21/2014   Depression 12/16/2014   Hypothyroidism  10/05/2014   Polycystic disease, ovaries 02/11/2014   Obesity 11/14/2011   OSA (obstructive sleep apnea) 01/09/2011   Atrial fibrillation (HCC) 12/01/2010   Healthcare maintenance 12/01/2010   GAIT ABNORMALITY 01/16/2010    Past Surgical History:  Procedure Laterality Date   ATRIAL FIBRILLATION ABLATION N/A 01/17/2018   Procedure: ATRIAL FIBRILLATION ABLATION;  Surgeon: Regan Lemming, MD;  Location: [facility];  Service: Cardiovascular;  Laterality: N/A;   BLADDER SURGERY  1997   interstitial cystitis    BREAST REDUCTION SURGERY  06/2013   BREAST SURGERY     reduction   DILATION AND CURETTAGE, DIAGNOSTIC / THERAPEUTIC     ENDOMETRIAL ABLATION     ENDOMETRIAL ABLATION  08/2010   HAND SURGERY  2006   due to cat bite   KNEE ARTHROSCOPY WITH MEDIAL MENISECTOMY Left 04/06/2015   Procedure: LEFT KNEE ARTHROSCOPY WITH PARTIAL MEDIAL MENISCECTOMY;  Surgeon: Tarry Kos, MD;  Location: [facility] OR;  Service: Orthopedics;  Laterality: Left;   OVARIAN CYST REMOVAL  04/28/13   REDUCTION MAMMAPLASTY Bilateral 2014   RENAL BIOPSY, OPEN  2006   TONSILLECTOMY  2007    OB History   No obstetric history on file.      Home Medications    Prior to Admission  medications   Medication Sig Start Date End Date Taking? Authorizing Provider  albuterol (VENTOLIN HFA) 108 (90 Base) MCG/ACT inhaler Inhale 2 puffs into the lungs every 4 (four) hours as needed. 08/16/23   Katha Cabal, DO  Blood Glucose Monitoring Suppl (FIFTY50 GLUCOSE METER 2.0) w/Device KIT Test as needed 07/23/19   [provider]  cetirizine (ZYRTEC) 10 MG tablet Take 1 tablet (10 mg total) by mouth daily. 05/21/16   Julieanne Manson, MD  Cholecalciferol (VITAMIN D3) 1.25 MG (50000 UT) CAPS Take 1 capsule by mouth once a week.    [provider]  clotrimazole (LOTRIMIN) 1 % cream Apply topically daily in the afternoon. 07/11/20   [provider]  diazepam (VALIUM) 5 MG tablet Take by mouth.  02/20/23   [provider]  diclofenac (VOLTAREN) 75 MG EC tablet Take 1 tablet (75 mg total) by mouth 2 (two) times daily. Take for 5 days with food. Then take as needed. 08/16/23   Krystalynn Ridgeway, [place] Meth, DO  escitalopram (LEXAPRO) 20 MG tablet Take 1 tablet (20 mg total) by mouth daily. 05/25/16   Julieanne Manson, MD  ipratropium (ATROVENT) 0.06 % nasal spray USE 2 SPRAYS IN BOTH  NOSTRILS 3 TIMES DAILY 08/14/19   [provider]  itraconazole (SPORANOX) 100 MG capsule Take 100 mg by mouth daily. Start taking once daily for 1 week and the following week begin twice daily    [provider]  levothyroxine (SYNTHROID) 137 MCG tablet Take 125 mcg by mouth daily before breakfast. 08/17/19   [provider]  liothyronine (CYTOMEL) 5 MCG tablet Take 5 mcg by mouth daily. Pt takes 2 by mouth daily    [provider]  metFORMIN (GLUCOPHAGE-XR) 500 MG 24 hr tablet Take 500 mg by mouth daily with breakfast. Pt takes 4 by mouth daily    [provider]  methocarbamol (ROBAXIN) 500 MG tablet Take 1 tablet (500 mg total) by mouth 2 (two) times daily. 08/16/23   Markee Remlinger, DO  MOUNJARO 2.5 MG/0.5ML Pen INJECT 2.5 MG ONCE A WEEK FOR 28 DAYS 04/26/23   [provider]  nitroGLYCERIN (NITRODUR - DOSED IN MG/24 HR) 0.2 mg/hr patch Apply 1/4th patch to affected ankle, change daily 09/14/22   Hudnall, Azucena Fallen, MD  progesterone (PROMETRIUM) 200 MG capsule Take 200 mg by mouth daily. 12/17/22   [provider]  spironolactone (ALDACTONE) 50 MG tablet Take 1 tablet by mouth daily. 10/20/13   [provider]  temazepam (RESTORIL) 15 MG capsule Take 15 mg by mouth once. 12/10/22   [provider]    Family History Family History  Problem Relation Age of Onset   Cardiomyopathy Father    Heart disease Father    Hyperlipidemia Father    Hypertension Father    Cardiomyopathy Brother    Hypertension Maternal Great-grandmother         fathers side   Hyperlipidemia Maternal Great-grandmother        fathers side   Heart failure Maternal Great-grandmother        both grandparents - paternal   Colon cancer Maternal Great-grandmother    Coronary artery disease Maternal Grandmother    Heart attack Maternal Grandmother        and other maternal family members   Heart attack Maternal Grandfather    Esophageal cancer Neg Hx    Rectal cancer Neg Hx    Stomach cancer Neg Hx     Social History Social History   Tobacco  Use   Smoking status: Former    Current packs/day: 0.00    Average packs/day: 2.0 packs/day for 16.0 years (32.0 ttl pk-yrs)    Types: Cigarettes    Start date: 09/03/1982    Quit date: 09/03/1998    Years since quitting: 25.2   Smokeless tobacco: Never  Vaping Use   Vaping status: Never Used  Substance Use Topics   Alcohol use: Not Currently    Comment: maybe 2 a mth   Drug use: Not Currently    Types: Marijuana    Comment: occasional     Allergies   Multaq [dronedarone], Benadryl [diphenhydramine hcl], Diphenhydramine, Fish oil, Folic acid, Influenza vaccines, Influenza virus vaccine, Latex, Oxycodone, Pedi-pre tape spray [wound dressing adhesive], Shellfish allergy, Shrimp extract, Tape, Vitamin b12, and Sulfa antibiotics   Review of Systems Review of Systems: negative unless otherwise stated in HPI.      Physical Exam Triage Vital Signs ED Triage Vitals [12/02/23 1639]  Encounter Vitals Group     BP (!) 148/97     Systolic BP Percentile      Diastolic BP Percentile      Pulse Rate 74     Resp 16     Temp 98.7 F (37.1 C)     Temp Source Oral     SpO2 96 %     Weight      Height      Head Circumference      Peak Flow      Pain Score 4     Pain Loc      Pain Education      Exclude from Growth Chart    No data found.  Updated Vital Signs BP (!) 148/97 (BP Location: Left Arm)   Pulse 74   Temp 98.7 F (37.1 C) (Oral)   Resp 16   LMP 07/22/2019   SpO2 96%   Visual  Acuity Right Eye Distance:   Left Eye Distance:   Bilateral Distance:    Right Eye Near:   Left Eye Near:    Bilateral Near:     Physical Exam GEN:     alert, non-toxic appearing female in no distress ***   HENT:  mucus membranes moist, oropharyngeal ***without lesions or ***erythema, no*** tonsillar hypertrophy or exudates, *** moderate erythematous edematous turbinates, ***clear nasal discharge, ***bilateral TM normal EYES:   pupils equal and reactive, ***no scleral injection or discharge NECK:  normal ROM, no ***lymphadenopathy, ***no meningismus   RESP:  no increased work of breathing, ***clear to auscultation bilaterally CVS:   regular rate ***and rhythm Skin:   warm and dry, no rash on visible skin***    UC Treatments / Results  Labs (all labs ordered are listed, but only abnormal results are displayed) Labs Reviewed - No data to display  EKG   Radiology No results found.  Procedures Procedures (including critical care time)  Medications Ordered in UC Medications - No data to display  Initial Impression / Assessment and Plan / UC Course  I have reviewed the triage vital signs and the nursing notes.  Pertinent labs & imaging results that were available during my care of the patient were reviewed by me and considered in my medical decision making (see chart for details).       Pt is a 53 y.o. female who presents for *** days of respiratory symptoms. Nikita is ***afebrile here without recent antipyretics. Satting well on room air. Overall pt is ***non-toxic appearing, well hydrated,  without respiratory distress. Pulmonary exam ***is unremarkable.  COVID and influenza panel obtained ***and was negative. ***Pt to quarantine until COVID test results or longer if positive.  I will call patient with test results, if positive. History consistent with ***viral respiratory illness. Discussed symptomatic treatment.  Explained lack of efficacy of antibiotics in viral disease.   Typical duration of symptoms discussed.   Return and ED precautions given and voiced understanding. Discussed MDM, treatment plan and plan for follow-up with patient*** who agrees with plan.     Final Clinical Impressions(s) / UC Diagnoses   Final diagnoses:  None   Discharge Instructions   None    ED Prescriptions   None    PDMP not reviewed this encounter.

## 2023-12-02 NOTE — Discharge Instructions (Signed)
 Stop by the pharmacy to pick up your prescriptions.  Follow up with your primary care provider or return to the urgent care, if not improving.   You didn't have a UTI or protein in your urine.

## 2023-12-02 NOTE — ED Triage Notes (Signed)
 Patient presents to UC for cough, congestion, bilateral eye burning, bilateral ear pressure, fatigue since Friday. Treating symptoms with NSAIDs, coricidin.

## 2023-12-03 DIAGNOSIS — E119 Type 2 diabetes mellitus without complications: Secondary | ICD-10-CM | POA: Diagnosis not present

## 2023-12-06 DIAGNOSIS — J069 Acute upper respiratory infection, unspecified: Secondary | ICD-10-CM | POA: Diagnosis not present

## 2023-12-09 DIAGNOSIS — F331 Major depressive disorder, recurrent, moderate: Secondary | ICD-10-CM | POA: Diagnosis not present

## 2023-12-12 DIAGNOSIS — M9901 Segmental and somatic dysfunction of cervical region: Secondary | ICD-10-CM | POA: Diagnosis not present

## 2023-12-12 DIAGNOSIS — M9903 Segmental and somatic dysfunction of lumbar region: Secondary | ICD-10-CM | POA: Diagnosis not present

## 2023-12-12 DIAGNOSIS — M9902 Segmental and somatic dysfunction of thoracic region: Secondary | ICD-10-CM | POA: Diagnosis not present

## 2023-12-12 DIAGNOSIS — M9905 Segmental and somatic dysfunction of pelvic region: Secondary | ICD-10-CM | POA: Diagnosis not present

## 2023-12-13 DIAGNOSIS — N951 Menopausal and female climacteric states: Secondary | ICD-10-CM | POA: Diagnosis not present

## 2023-12-13 DIAGNOSIS — E669 Obesity, unspecified: Secondary | ICD-10-CM | POA: Diagnosis not present

## 2023-12-13 DIAGNOSIS — E538 Deficiency of other specified B group vitamins: Secondary | ICD-10-CM | POA: Diagnosis not present

## 2023-12-13 DIAGNOSIS — E119 Type 2 diabetes mellitus without complications: Secondary | ICD-10-CM | POA: Diagnosis not present

## 2023-12-13 DIAGNOSIS — E063 Autoimmune thyroiditis: Secondary | ICD-10-CM | POA: Diagnosis not present

## 2023-12-13 DIAGNOSIS — E7219 Other disorders of sulfur-bearing amino-acid metabolism: Secondary | ICD-10-CM | POA: Diagnosis not present

## 2023-12-13 DIAGNOSIS — E039 Hypothyroidism, unspecified: Secondary | ICD-10-CM | POA: Diagnosis not present

## 2023-12-13 DIAGNOSIS — B488 Other specified mycoses: Secondary | ICD-10-CM | POA: Diagnosis not present

## 2023-12-16 DIAGNOSIS — F331 Major depressive disorder, recurrent, moderate: Secondary | ICD-10-CM | POA: Diagnosis not present

## 2023-12-23 ENCOUNTER — Ambulatory Visit
Admission: RE | Admit: 2023-12-23 | Discharge: 2023-12-23 | Disposition: A | Discharge status: Home / Self Care | Payer: BC Managed Care – PPO | Source: Ambulatory Visit | Attending: Obstetrics and Gynecology | Admitting: Obstetrics and Gynecology

## 2023-12-23 DIAGNOSIS — Z6841 Body Mass Index (BMI) 40.0 and over, adult: Secondary | ICD-10-CM | POA: Diagnosis not present

## 2023-12-23 DIAGNOSIS — M9905 Segmental and somatic dysfunction of pelvic region: Secondary | ICD-10-CM | POA: Diagnosis not present

## 2023-12-23 DIAGNOSIS — Z1231 Encounter for screening mammogram for malignant neoplasm of breast: Secondary | ICD-10-CM | POA: Diagnosis not present

## 2023-12-23 DIAGNOSIS — Z01419 Encounter for gynecological examination (general) (routine) without abnormal findings: Secondary | ICD-10-CM | POA: Diagnosis not present

## 2023-12-23 DIAGNOSIS — M9903 Segmental and somatic dysfunction of lumbar region: Secondary | ICD-10-CM | POA: Diagnosis not present

## 2023-12-23 DIAGNOSIS — M9902 Segmental and somatic dysfunction of thoracic region: Secondary | ICD-10-CM | POA: Diagnosis not present

## 2023-12-23 DIAGNOSIS — M9901 Segmental and somatic dysfunction of cervical region: Secondary | ICD-10-CM | POA: Diagnosis not present

## 2023-12-24 DIAGNOSIS — F331 Major depressive disorder, recurrent, moderate: Secondary | ICD-10-CM | POA: Diagnosis not present

## 2023-12-29 DIAGNOSIS — J0100 Acute maxillary sinusitis, unspecified: Secondary | ICD-10-CM | POA: Diagnosis not present

## 2024-01-02 DIAGNOSIS — E119 Type 2 diabetes mellitus without complications: Secondary | ICD-10-CM | POA: Diagnosis not present

## 2024-01-06 DIAGNOSIS — M9903 Segmental and somatic dysfunction of lumbar region: Secondary | ICD-10-CM | POA: Diagnosis not present

## 2024-01-06 DIAGNOSIS — M9901 Segmental and somatic dysfunction of cervical region: Secondary | ICD-10-CM | POA: Diagnosis not present

## 2024-01-06 DIAGNOSIS — M9902 Segmental and somatic dysfunction of thoracic region: Secondary | ICD-10-CM | POA: Diagnosis not present

## 2024-01-06 DIAGNOSIS — M9905 Segmental and somatic dysfunction of pelvic region: Secondary | ICD-10-CM | POA: Diagnosis not present

## 2024-01-07 DIAGNOSIS — F331 Major depressive disorder, recurrent, moderate: Secondary | ICD-10-CM | POA: Diagnosis not present

## 2024-01-13 DIAGNOSIS — F331 Major depressive disorder, recurrent, moderate: Secondary | ICD-10-CM | POA: Diagnosis not present

## 2024-01-16 DIAGNOSIS — R591 Generalized enlarged lymph nodes: Secondary | ICD-10-CM | POA: Diagnosis not present

## 2024-01-16 DIAGNOSIS — R5383 Other fatigue: Secondary | ICD-10-CM | POA: Diagnosis not present

## 2024-01-16 DIAGNOSIS — E063 Autoimmune thyroiditis: Secondary | ICD-10-CM | POA: Diagnosis not present

## 2024-01-16 DIAGNOSIS — B488 Other specified mycoses: Secondary | ICD-10-CM | POA: Diagnosis not present

## 2024-01-17 ENCOUNTER — Encounter: Payer: Self-pay | Admitting: Pulmonary Disease

## 2024-01-17 ENCOUNTER — Ambulatory Visit: Attending: Pulmonary Disease

## 2024-01-17 ENCOUNTER — Ambulatory Visit: Attending: Pulmonary Disease | Admitting: Pulmonary Disease

## 2024-01-17 VITALS — BP 118/86 | HR 72 | Ht 65.0 in

## 2024-01-17 DIAGNOSIS — I480 Paroxysmal atrial fibrillation: Secondary | ICD-10-CM

## 2024-01-17 DIAGNOSIS — G4733 Obstructive sleep apnea (adult) (pediatric): Secondary | ICD-10-CM | POA: Diagnosis not present

## 2024-01-17 DIAGNOSIS — E063 Autoimmune thyroiditis: Secondary | ICD-10-CM | POA: Diagnosis not present

## 2024-01-17 DIAGNOSIS — N951 Menopausal and female climacteric states: Secondary | ICD-10-CM | POA: Diagnosis not present

## 2024-01-17 DIAGNOSIS — E669 Obesity, unspecified: Secondary | ICD-10-CM | POA: Diagnosis not present

## 2024-01-17 DIAGNOSIS — B488 Other specified mycoses: Secondary | ICD-10-CM | POA: Diagnosis not present

## 2024-01-17 NOTE — Progress Notes (Signed)
 Electrophysiology Office Note:   Date:  01/17/2024  ID:  Desiree Schultz, DOB 18-Mar-1971, MRN [id number]  Primary Cardiologist: Richardo Chandler, MD Primary Heart Failure: None Electrophysiologist: Will Cortland Ding, MD      History of Present Illness:   Desiree Schultz is a 53 y.o. female with h/o AF, OSA, DM II, hypothyroidism, PCOS, chronic pain seen today for routine electrophysiology followup.   Since last being seen in our clinic the patient reports she feels that she has been doing largely well overall.  She follows with an integrative medicine practitioner and [place].  She tries to avoid medications if at all possible.  She does take multiple supplements that are prescribed to her by her integrative medicine physician (berberine, vitamin K, O.N.E multivitamin, vitamin D3, beet root, DHEA 5 mg, cyanocobalamin).  Most recently she completed 6 months of antifungals for inflammation.  She notes that in the recent months she has noted occasional palpitations but is not certain that it is A-fib.  She works out and does [employer].  She thinks that she is in need of a knee replacement and this limits her lower body workouts.  She denies chest pain, palpitations, dyspnea, PND, orthopnea, nausea, vomiting, dizziness, syncope, edema, weight gain, or early satiety.   Review of systems complete and found to be negative unless listed in HPI.   EP Information / Studies Reviewed:    EKG is ordered today. Personal review as below.  EKG Interpretation Date/Time:  Friday Jan 17 2024 15:35:10 EDT Ventricular Rate:  69 PR Interval:  174 QRS Duration:  72 QT Interval:  382 QTC Calculation: 409 R Axis:   73  Text Interpretation: Normal sinus rhythm Confirmed by Creighton Doffing ([id number]) on 01/17/2024 3:37:10 PM   Studies:  ECHO 12/2017 > LVEF 50-55% CT Cardiac Morphology 01/14/18 > normal PV drainage into LA, CAC score 0 EPS 01/17/18 > SR on presentation, PV isolation with RF current Cardiac Monitor  07/2018 > SR with ST, palpitations associated with SR & rare PVC's, no AF noted ECHO 09/2020 > LVEF 55-60% Gated SPECT 03/2022 > normal / low risk study, EF 58%   Arrhythmia / AAD AF  Dronedarone  > stopped in 2017 due to swelling   Risk Assessment/Calculations:    CHA2DS2-VASc Score = 2   This indicates a 2.2% annual risk of stroke. The patient's score is based upon: CHF History: 0 HTN History: 0 Diabetes History: 1 Stroke History: 0 Vascular Disease History: 0 Age Score: 0 Gender Score: 1              Physical Exam:   VS:  BP 118/86   Pulse 72   Ht 5\' 5"  (1.651 m)   LMP 07/22/2019   SpO2 97%   BMI 42.43 kg/m    Wt Readings from Last 3 Encounters:  08/16/23 255 lb (115.7 kg)  03/12/23 270 lb (122.5 kg)  02/25/23 270 lb (122.5 kg)     GEN: pleasant adult female, well nourished, well developed in no acute distress NECK: No JVD; No carotid bruits CARDIAC: Regular rate and rhythm, no murmurs, rubs, gallops RESPIRATORY:  Clear to auscultation without rales, wheezing or rhonchi  ABDOMEN: Soft, non-tender, non-distended EXTREMITIES:  No edema; No deformity   ASSESSMENT AND PLAN:    Paroxysmal Atrial Fibrillation  CHA2DS2-VASc 2, s/p ablation 01/17/18 -no symptom burden post ablation  -EKG with NSR, no ectopy -assess 7-day cardiac monitor to rule out arrhythmia -patient is not interested in taking medications if possible, reviewed  technology change from radiofrequency to pulse field ablation with her in the event that she has recurrent burden of AF -not on OAC with low risk score / NSR   OSA  -CPAP compliant   Follow up with Dr. Lawana Pray or EP APP 1.5-2 months   Signed, Creighton Doffing, NP-C, AGACNP-BC [facility] - Electrophysiology  01/17/2024, 4:24 PM

## 2024-01-17 NOTE — Patient Instructions (Addendum)
 Medication Instructions:  No medication changes were made during today's visit.  *If you need a refill on your cardiac medications before your next appointment, please call your pharmacy*   Lab Work: No labs were ordered during today's visit.  If you have labs (blood work) drawn today and your tests are completely normal, you will receive your results only by: MyChart Message (if you have MyChart) OR A paper copy in the mail If you have any lab test that is abnormal or we need to change your treatment, we will call you to review the results.   Testing/Procedures:  ZIO XT- Long Term Monitor Instructions   Your physician has requested you wear your ZIO patch monitor____7___days.   This is a single patch monitor.  [employer] supplies one patch monitor per enrollment.  Additional stickers are not available.   Please do not apply patch if you will be having a Nuclear Stress Test, Echocardiogram, Cardiac CT, MRI, or Chest Xray during the time frame you would be wearing the monitor. The patch cannot be worn during these tests.  You cannot remove and re-apply the ZIO XT patch monitor.   Your ZIO patch monitor will be sent USPS Priority mail from [facility] directly to your home address. The monitor may also be mailed to a PO BOX if home delivery is not available.   It may take 3-5 days to receive your monitor after you have been enrolled.   Once you have received you monitor, please review enclosed instructions.  Your monitor has already been registered assigning a specific monitor serial # to you.   Applying the monitor   Shave hair from upper left chest.   Hold abrader disc by orange tab.  Rub abrader in 40 strokes over left upper chest as indicated in your monitor instructions.   Clean area with 4 enclosed alcohol pads .  Use all pads to assure are is cleaned thoroughly.  Let dry.   Apply patch as indicated in monitor instructions.  Patch will be place under collarbone on left  side of chest with arrow pointing upward.   Rub patch adhesive wings for 2 minutes.Remove white label marked "1".  Remove white label marked "2".  Rub patch adhesive wings for 2 additional minutes.   While looking in a mirror, press and release button in center of patch.  A small green light will flash 3-4 times .  This will be your only indicator the monitor has been turned on.     Do not shower for the first 24 hours.  You may shower after the first 24 hours.   Press button if you feel a symptom. You will hear a small click.  Record Date, Time and Symptom in the Patient Log Book.   When you are ready to remove patch, follow instructions on last 2 pages of Patient Log Book.  Stick patch monitor onto last page of Patient Log Book.   Place Patient Log Book in [place] box.  Use locking tab on box and tape box closed securely.  The [employer] and [employer] has [employer] on it.  Please place in mailbox as soon as possible.  Your physician should have your test results approximately 7 days after the monitor has been mailed back to [facility].   Call [facility] Customer Care at [phone] if you have questions regarding your ZIO XT patch monitor.  Call them immediately if you see an orange light blinking on your monitor.   If your  monitor falls off in less than 4 days contact our Monitor department at [phone].  If your monitor becomes loose or falls off after 4 days call [employer] at [phone] for suggestions on securing your monitor.   ZIO XT- Long Term Monitor Instructions   Your physician has requested you wear your ZIO patch monitor 7 days.   This is a single patch monitor.  [employer] supplies one patch monitor per enrollment.  Additional stickers are not available.   Please do not apply patch if you will be having a Nuclear Stress Test, Echocardiogram, Cardiac CT, MRI, or Chest Xray during the time frame you would be wearing the monitor. The patch cannot be worn during these  tests.  You cannot remove and re-apply the ZIO XT patch monitor.   Your ZIO patch monitor will be sent USPS Priority mail from [facility] directly to your home address. The monitor may also be mailed to a PO BOX if home delivery is not available.   It may take 3-5 days to receive your monitor after you have been enrolled.   Once you have received you monitor, please review enclosed instructions.  Your monitor has already been registered assigning a specific monitor serial # to you.   Applying the monitor   Shave hair from upper left chest.   Hold abrader disc by orange tab.  Rub abrader in 40 strokes over left upper chest as indicated in your monitor instructions.   Clean area with 4 enclosed alcohol pads .  Use all pads to assure are is cleaned thoroughly.  Let dry.   Apply patch as indicated in monitor instructions.  Patch will be place under collarbone on left side of chest with arrow pointing upward.   Rub patch adhesive wings for 2 minutes.Remove white label marked "1".  Remove white label marked "2".  Rub patch adhesive wings for 2 additional minutes.   While looking in a mirror, press and release button in center of patch.  A small green light will flash 3-4 times .  This will be your only indicator the monitor has been turned on.     Do not shower for the first 24 hours.  You may shower after the first 24 hours.   Press button if you feel a symptom. You will hear a small click.  Record Date, Time and Symptom in the Patient Log Book.   When you are ready to remove patch, follow instructions on last 2 pages of Patient Log Book.  Stick patch monitor onto last page of Patient Log Book.   Place Patient Log Book in [place] box.  Use locking tab on box and tape box closed securely.  The [employer] has [employer] on it.  Please place in mailbox as soon as possible.  Your physician should have your test results approximately 7 days after the monitor has been mailed back  to [facility].   Call [facility] Customer Care at [phone] if you have questions regarding your ZIO XT patch monitor.  Call them immediately if you see an orange light blinking on your monitor.   If your monitor falls off in less than 4 days contact our Monitor department at [phone].  If your monitor becomes loose or falls off after 4 days call [employer] at [phone] for suggestions on securing your monitor.     Follow-Up: At [facility], you and your health needs are our priority.  As part of our continuing mission to provide  you with exceptional heart care, we have created designated Provider Care Teams.  These Care Teams include your primary Cardiologist (physician) and Advanced Practice Providers (APPs -  Physician Assistants and Nurse Practitioners) who all work together to provide you with the care you need, when you need it.  We recommend signing up for the patient portal called "MyChart".  Sign up information is provided on this After Visit Summary.  MyChart is used to connect with patients for Virtual Visits (Telemedicine).  Patients are able to view lab/test results, encounter notes, upcoming appointments, etc.  Non-urgent messages can be sent to your provider as well.   To learn more about what you can do with MyChart, go to [email or web address].    Your next appointment:   1-2 month(s) patient is requesting a Friday, last appointment of the day.  Provider:   Creighton Doffing    Other Instructions Thank you for choosing [facility]!

## 2024-01-17 NOTE — Progress Notes (Unsigned)
Enrolled patient for a 7 day Zio XT monitor to be mailed to patients home  [place] to read

## 2024-01-20 DIAGNOSIS — F331 Major depressive disorder, recurrent, moderate: Secondary | ICD-10-CM | POA: Diagnosis not present

## 2024-01-22 DIAGNOSIS — K6289 Other specified diseases of anus and rectum: Secondary | ICD-10-CM | POA: Diagnosis not present

## 2024-02-02 DIAGNOSIS — E119 Type 2 diabetes mellitus without complications: Secondary | ICD-10-CM | POA: Diagnosis not present

## 2024-02-03 DIAGNOSIS — F331 Major depressive disorder, recurrent, moderate: Secondary | ICD-10-CM | POA: Diagnosis not present

## 2024-02-05 DIAGNOSIS — M9902 Segmental and somatic dysfunction of thoracic region: Secondary | ICD-10-CM | POA: Diagnosis not present

## 2024-02-05 DIAGNOSIS — M9905 Segmental and somatic dysfunction of pelvic region: Secondary | ICD-10-CM | POA: Diagnosis not present

## 2024-02-05 DIAGNOSIS — M9901 Segmental and somatic dysfunction of cervical region: Secondary | ICD-10-CM | POA: Diagnosis not present

## 2024-02-05 DIAGNOSIS — M9903 Segmental and somatic dysfunction of lumbar region: Secondary | ICD-10-CM | POA: Diagnosis not present

## 2024-02-12 DIAGNOSIS — M9905 Segmental and somatic dysfunction of pelvic region: Secondary | ICD-10-CM | POA: Diagnosis not present

## 2024-02-12 DIAGNOSIS — M9902 Segmental and somatic dysfunction of thoracic region: Secondary | ICD-10-CM | POA: Diagnosis not present

## 2024-02-12 DIAGNOSIS — M9901 Segmental and somatic dysfunction of cervical region: Secondary | ICD-10-CM | POA: Diagnosis not present

## 2024-02-12 DIAGNOSIS — M9903 Segmental and somatic dysfunction of lumbar region: Secondary | ICD-10-CM | POA: Diagnosis not present

## 2024-02-15 DIAGNOSIS — G4733 Obstructive sleep apnea (adult) (pediatric): Secondary | ICD-10-CM | POA: Diagnosis not present

## 2024-02-15 DIAGNOSIS — I480 Paroxysmal atrial fibrillation: Secondary | ICD-10-CM | POA: Diagnosis not present

## 2024-02-17 ENCOUNTER — Ambulatory Visit: Payer: Self-pay | Admitting: Pulmonary Disease

## 2024-02-17 DIAGNOSIS — I480 Paroxysmal atrial fibrillation: Secondary | ICD-10-CM | POA: Diagnosis not present

## 2024-02-17 DIAGNOSIS — F331 Major depressive disorder, recurrent, moderate: Secondary | ICD-10-CM | POA: Diagnosis not present

## 2024-02-24 DIAGNOSIS — F331 Major depressive disorder, recurrent, moderate: Secondary | ICD-10-CM | POA: Diagnosis not present

## 2024-02-26 DIAGNOSIS — M9903 Segmental and somatic dysfunction of lumbar region: Secondary | ICD-10-CM | POA: Diagnosis not present

## 2024-02-26 DIAGNOSIS — M9901 Segmental and somatic dysfunction of cervical region: Secondary | ICD-10-CM | POA: Diagnosis not present

## 2024-02-26 DIAGNOSIS — M9902 Segmental and somatic dysfunction of thoracic region: Secondary | ICD-10-CM | POA: Diagnosis not present

## 2024-02-26 DIAGNOSIS — M9905 Segmental and somatic dysfunction of pelvic region: Secondary | ICD-10-CM | POA: Diagnosis not present

## 2024-03-02 DIAGNOSIS — F331 Major depressive disorder, recurrent, moderate: Secondary | ICD-10-CM | POA: Diagnosis not present

## 2024-03-03 DIAGNOSIS — E119 Type 2 diabetes mellitus without complications: Secondary | ICD-10-CM | POA: Diagnosis not present

## 2024-03-13 ENCOUNTER — Ambulatory Visit: Admitting: Pulmonary Disease

## 2024-03-14 DIAGNOSIS — E119 Type 2 diabetes mellitus without complications: Secondary | ICD-10-CM | POA: Diagnosis not present

## 2024-03-14 DIAGNOSIS — D3131 Benign neoplasm of right choroid: Secondary | ICD-10-CM | POA: Diagnosis not present

## 2024-03-14 DIAGNOSIS — H524 Presbyopia: Secondary | ICD-10-CM | POA: Diagnosis not present

## 2024-03-16 DIAGNOSIS — F331 Major depressive disorder, recurrent, moderate: Secondary | ICD-10-CM | POA: Diagnosis not present

## 2024-03-24 DIAGNOSIS — F331 Major depressive disorder, recurrent, moderate: Secondary | ICD-10-CM | POA: Diagnosis not present

## 2024-03-25 DIAGNOSIS — M9901 Segmental and somatic dysfunction of cervical region: Secondary | ICD-10-CM | POA: Diagnosis not present

## 2024-03-25 DIAGNOSIS — M9905 Segmental and somatic dysfunction of pelvic region: Secondary | ICD-10-CM | POA: Diagnosis not present

## 2024-03-25 DIAGNOSIS — M9902 Segmental and somatic dysfunction of thoracic region: Secondary | ICD-10-CM | POA: Diagnosis not present

## 2024-03-25 DIAGNOSIS — M9903 Segmental and somatic dysfunction of lumbar region: Secondary | ICD-10-CM | POA: Diagnosis not present

## 2024-03-31 DIAGNOSIS — F331 Major depressive disorder, recurrent, moderate: Secondary | ICD-10-CM | POA: Diagnosis not present

## 2024-04-01 DIAGNOSIS — M9902 Segmental and somatic dysfunction of thoracic region: Secondary | ICD-10-CM | POA: Diagnosis not present

## 2024-04-01 DIAGNOSIS — M9903 Segmental and somatic dysfunction of lumbar region: Secondary | ICD-10-CM | POA: Diagnosis not present

## 2024-04-01 DIAGNOSIS — M9901 Segmental and somatic dysfunction of cervical region: Secondary | ICD-10-CM | POA: Diagnosis not present

## 2024-04-01 DIAGNOSIS — M9905 Segmental and somatic dysfunction of pelvic region: Secondary | ICD-10-CM | POA: Diagnosis not present

## 2024-04-03 DIAGNOSIS — E119 Type 2 diabetes mellitus without complications: Secondary | ICD-10-CM | POA: Diagnosis not present

## 2024-04-09 DIAGNOSIS — F331 Major depressive disorder, recurrent, moderate: Secondary | ICD-10-CM | POA: Diagnosis not present

## 2024-04-13 DIAGNOSIS — M9905 Segmental and somatic dysfunction of pelvic region: Secondary | ICD-10-CM | POA: Diagnosis not present

## 2024-04-13 DIAGNOSIS — M9902 Segmental and somatic dysfunction of thoracic region: Secondary | ICD-10-CM | POA: Diagnosis not present

## 2024-04-13 DIAGNOSIS — M9901 Segmental and somatic dysfunction of cervical region: Secondary | ICD-10-CM | POA: Diagnosis not present

## 2024-04-13 DIAGNOSIS — M9903 Segmental and somatic dysfunction of lumbar region: Secondary | ICD-10-CM | POA: Diagnosis not present

## 2024-04-15 ENCOUNTER — Ambulatory Visit: Admitting: Internal Medicine

## 2024-04-15 ENCOUNTER — Encounter: Payer: Self-pay | Admitting: Internal Medicine

## 2024-04-15 VITALS — BP 124/74 | Ht 66.0 in | Wt 247.0 lb

## 2024-04-15 DIAGNOSIS — M25562 Pain in left knee: Secondary | ICD-10-CM

## 2024-04-15 MED ORDER — KETOROLAC TROMETHAMINE 60 MG/2ML IM SOLN
60.0000 mg | Freq: Once | INTRAMUSCULAR | Status: AC
  Administered 2024-04-15 (×2): 60 mg via INTRAMUSCULAR

## 2024-04-15 NOTE — Progress Notes (Addendum)
 PCP: Mat Browning, MD  Subjective:   HPI: Patient is a 53 y.o. female here for acute on chronic left knee pain.  She endorses a history of chronic posterior left knee pain.  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  It was also notable for medial patellofemoral predominant osteoarthritis with full-thickness cartilage loss in the medial compartment and intermediate to high-grade partial-thickness cartilage loss of the patellofemoral compartment.  Desiree Schultz reports walking from the sidewalk to the steps at her home when she felt a pop along the posterior part of the left knee.  Since that time she endorses significant pain, swelling, and inability to bear weight on the left leg.  Pain is elicited with extension and flexion of the knee.  She is requesting an MRI as soon as possible.  Total knee arthroplasty has previously been discussed and she is working to lose weight but she does not believe her current pain is attributable to arthritis or the previously noted meniscal tear.  She is currently managing her symptoms with frequent use of NSAIDs.  Past Medical History:  Diagnosis Date   Allergic rhinitis    Anemia    Atrial fibrillation (HCC)    2006   Bronchial breathing    Chronic anxiety    Depression    Diabetes mellitus without complication (HCC)    Dysrhythmia    Elevated blood pressure    On therapy. Probably hypertension   Fibroid    Hx of cardiovascular stress test    a. Myoview  10/13:  no ischemia; EF 57%   Hypothyroid    Menorrhagia    2011   Obesity (BMI 30-39.9)    OSA (obstructive sleep apnea)    on CPAP   PCOS (polycystic ovarian syndrome)    PONV (postoperative nausea and vomiting)    C/o motion sickness   Prediabetes    Vitamin D  deficiency     Current Outpatient Medications on File Prior to Visit  Medication Sig Dispense Refill   albuterol   (VENTOLIN  HFA) 108 (90 Base) MCG/ACT inhaler Inhale 2 puffs into the lungs every 4 (four) hours as needed. 6.7 g 0   Ascorbic Acid  (VITAMIN C ) 1000 MG tablet Take 1,000 mg by mouth daily.     Blood Glucose Monitoring Suppl (FIFTY50 GLUCOSE METER 2.0) w/Device KIT Test as needed     cetirizine  (ZYRTEC ) 10 MG tablet Take 1 tablet (10 mg total) by mouth daily. 30 tablet 11   Cholecalciferol  (VITAMIN D3) 1.25 MG (50000 UT) CAPS Take 1 capsule by mouth once a week.     clotrimazole (LOTRIMIN) 1 % cream Apply topically daily in the afternoon.     diazepam (VALIUM) 5 MG tablet Take by mouth.     diclofenac  (VOLTAREN ) 75 MG EC tablet Take 1 tablet (75 mg total) by mouth 2 (two) times daily. Take for 5 days with food. Then take as needed. 40 tablet 0   escitalopram  (LEXAPRO ) 20 MG tablet Take 1 tablet (20 mg total) by mouth daily. 30 tablet 11   fluticasone  (FLONASE ) 50 MCG/ACT nasal spray Place 2 sprays into both nostrils daily. 9.9 mL 2   ipratropium (ATROVENT ) 0.06 % nasal spray USE 2 SPRAYS IN BOTH  NOSTRILS 3 TIMES DAILY     itraconazole (SPORANOX) 100 MG capsule Take 100 mg by mouth daily. Start taking once daily for 1 week and the following  week begin twice daily     levothyroxine  (SYNTHROID ) 137 MCG tablet Take 125 mcg by mouth daily before breakfast.     liothyronine (CYTOMEL) 5 MCG tablet Take 5 mcg by mouth daily. Pt takes 2 by mouth daily     metFORMIN  (GLUCOPHAGE -XR) 500 MG 24 hr tablet Take 500 mg by mouth daily with breakfast. Pt takes 4 by mouth daily     methocarbamol  (ROBAXIN ) 500 MG tablet Take 1 tablet (500 mg total) by mouth 2 (two) times daily. 20 tablet 0   montelukast  (SINGULAIR ) 10 MG tablet Take 1 tablet (10 mg total) by mouth at bedtime. 30 tablet 2   MOUNJARO 2.5 MG/0.5ML Pen INJECT 2.5 MG ONCE A WEEK FOR 28 DAYS     progesterone  (PROMETRIUM ) 200 MG capsule Take 200 mg by mouth daily.     temazepam (RESTORIL) 15 MG capsule Take 15 mg by mouth once.     UNABLE TO FIND Place 1  Troche inside cheek daily. Med Name: Estrogen-Progesterone -Testerone     No current facility-administered medications on file prior to visit.    Past Surgical History:  Procedure Laterality Date   ATRIAL FIBRILLATION ABLATION N/A 01/17/2018   Procedure: ATRIAL FIBRILLATION ABLATION;  Surgeon: Inocencio Soyla Lunger, MD;  Location: [facility];  Service: Cardiovascular;  Laterality: N/A;   BLADDER SURGERY  1997   interstitial cystitis    BREAST REDUCTION SURGERY  06/2013   BREAST SURGERY     reduction   DILATION AND CURETTAGE, DIAGNOSTIC / THERAPEUTIC     ENDOMETRIAL ABLATION     ENDOMETRIAL ABLATION  08/2010   HAND SURGERY  2006   due to cat bite   KNEE ARTHROSCOPY WITH MEDIAL MENISECTOMY Left 04/06/2015   Procedure: LEFT KNEE ARTHROSCOPY WITH PARTIAL MEDIAL MENISCECTOMY;  Surgeon: Kay CHRISTELLA Cummins, MD;  Location: [facility];  Service: Orthopedics;  Laterality: Left;   OVARIAN CYST REMOVAL  04/28/13   REDUCTION MAMMAPLASTY Bilateral 2014   RENAL BIOPSY, OPEN  2006   TONSILLECTOMY  2007    Allergies  Allergen Reactions   Multaq  [Dronedarone ] Swelling    Per patient   Benadryl [Diphenhydramine Hcl] Itching   Diphenhydramine Itching   Fish Oil     Makes hands raw, mouth and female area  Other reaction(s): Other   Folic Acid     Influenza Vaccines     Gives pt the flu and site of injection swells and hot   Influenza Virus Vaccine     Other reaction(s): Other   Latex     Burns skin   Oxycodone  Nausea And Vomiting   Pedi-Pre Tape Spray [Wound Dressing Adhesive] Itching    Burns skin, band-aid, ekg stickers  Burns skin, band-aid, ekg stickers   Shellfish Allergy     Other reaction(s): Contact Dermatitis (intolerance)   Shrimp Extract    Tape     Burns skin, band-aid, ekg stickers   Vitamin B12    Sulfa Antibiotics Rash    Other reaction(s): Other    BP 124/74   Ht 5' 6 (1.676 m)   Wt 247 lb (112 kg)   LMP 07/22/2019   BMI 39.87 kg/m       No data to display               No data to display              Objective:  Physical Exam:  Gen: NAD, comfortable in exam room  Left Knee No gross deformity,  ecchymoses, swelling. Mild TTP over the posterior fossa Pain is elicited with flexion beyond 90 degrees There is a 10 degree limitation of extension Negative ant/post drawers. Negative valgus/varus testing. Negative lachman.  Positive McMurray's NV intact distally.    Assessment & Plan:  1. Acute on chronic left knee pain Known history of advanced osteoarthritis along the medial patellofemoral components of the left knee as well as medial meniscal tear.  She presents today endorsing acute worsening of posterior pain after feeling a popping sensation last weekend.  No acute deformity or findings noted on examination of the knee today.  Her range of motion is limited secondary to pain.  Concern for arthritic flare vs additional pathology to explain her symptoms.  We discussed additional treatment options today.  She feels strongly about updating an MRI of the left knee.  This has been discussed within the last year and is reasonable given acute worsening of pain.  She plans to establish care with orthopedic surgery to discuss arthroscopy vs arthroplasty.  She is aware that arthroplasty is likely going to be recommended.  Regarding acute pain relief, she was offered intra-articular corticosteroid injection vs IM Toradol  injection vs continued use of NSAIDs.  She has has been about proceeding with steroid injection but is agreeable to Toradol  injection.   -Repeat MRI left knee -Toradol  IM 60 mg administered today -Continue as needed use of diclofenac  for pain relief -She was given a new scaphoid pad for her left orthotic today at her request -Follow-up after MRI

## 2024-04-16 DIAGNOSIS — F331 Major depressive disorder, recurrent, moderate: Secondary | ICD-10-CM | POA: Diagnosis not present

## 2024-04-20 ENCOUNTER — Encounter: Payer: Self-pay | Admitting: Internal Medicine

## 2024-04-21 ENCOUNTER — Other Ambulatory Visit

## 2024-04-22 ENCOUNTER — Ambulatory Visit: Admitting: Family Medicine

## 2024-04-22 ENCOUNTER — Ambulatory Visit
Admission: RE | Admit: 2024-04-22 | Discharge: 2024-04-22 | Disposition: A | Discharge status: Home / Self Care | Source: Ambulatory Visit | Attending: Internal Medicine | Admitting: Internal Medicine

## 2024-04-22 DIAGNOSIS — M25562 Pain in left knee: Secondary | ICD-10-CM

## 2024-04-22 DIAGNOSIS — M1712 Unilateral primary osteoarthritis, left knee: Secondary | ICD-10-CM | POA: Diagnosis not present

## 2024-04-23 DIAGNOSIS — F331 Major depressive disorder, recurrent, moderate: Secondary | ICD-10-CM | POA: Diagnosis not present

## 2024-04-27 ENCOUNTER — Ambulatory Visit: Payer: Self-pay | Admitting: Internal Medicine

## 2024-04-27 DIAGNOSIS — F331 Major depressive disorder, recurrent, moderate: Secondary | ICD-10-CM | POA: Diagnosis not present

## 2024-04-29 DIAGNOSIS — M9903 Segmental and somatic dysfunction of lumbar region: Secondary | ICD-10-CM | POA: Diagnosis not present

## 2024-04-29 DIAGNOSIS — N951 Menopausal and female climacteric states: Secondary | ICD-10-CM | POA: Diagnosis not present

## 2024-04-29 DIAGNOSIS — E669 Obesity, unspecified: Secondary | ICD-10-CM | POA: Diagnosis not present

## 2024-04-29 DIAGNOSIS — E119 Type 2 diabetes mellitus without complications: Secondary | ICD-10-CM | POA: Diagnosis not present

## 2024-04-29 DIAGNOSIS — M9902 Segmental and somatic dysfunction of thoracic region: Secondary | ICD-10-CM | POA: Diagnosis not present

## 2024-04-29 DIAGNOSIS — E7219 Other disorders of sulfur-bearing amino-acid metabolism: Secondary | ICD-10-CM | POA: Diagnosis not present

## 2024-04-29 DIAGNOSIS — B488 Other specified mycoses: Secondary | ICD-10-CM | POA: Diagnosis not present

## 2024-04-29 DIAGNOSIS — M9901 Segmental and somatic dysfunction of cervical region: Secondary | ICD-10-CM | POA: Diagnosis not present

## 2024-04-29 DIAGNOSIS — E063 Autoimmune thyroiditis: Secondary | ICD-10-CM | POA: Diagnosis not present

## 2024-04-29 DIAGNOSIS — M9905 Segmental and somatic dysfunction of pelvic region: Secondary | ICD-10-CM | POA: Diagnosis not present

## 2024-04-29 DIAGNOSIS — E039 Hypothyroidism, unspecified: Secondary | ICD-10-CM | POA: Diagnosis not present

## 2024-04-29 DIAGNOSIS — E7211 Homocystinuria: Secondary | ICD-10-CM | POA: Diagnosis not present

## 2024-05-04 DIAGNOSIS — E119 Type 2 diabetes mellitus without complications: Secondary | ICD-10-CM | POA: Diagnosis not present

## 2024-05-05 DIAGNOSIS — F331 Major depressive disorder, recurrent, moderate: Secondary | ICD-10-CM | POA: Diagnosis not present

## 2024-05-16 DIAGNOSIS — M9905 Segmental and somatic dysfunction of pelvic region: Secondary | ICD-10-CM | POA: Diagnosis not present

## 2024-05-16 DIAGNOSIS — M9902 Segmental and somatic dysfunction of thoracic region: Secondary | ICD-10-CM | POA: Diagnosis not present

## 2024-05-16 DIAGNOSIS — M9903 Segmental and somatic dysfunction of lumbar region: Secondary | ICD-10-CM | POA: Diagnosis not present

## 2024-05-16 DIAGNOSIS — M9901 Segmental and somatic dysfunction of cervical region: Secondary | ICD-10-CM | POA: Diagnosis not present

## 2024-05-18 DIAGNOSIS — F331 Major depressive disorder, recurrent, moderate: Secondary | ICD-10-CM | POA: Diagnosis not present

## 2024-05-25 DIAGNOSIS — F331 Major depressive disorder, recurrent, moderate: Secondary | ICD-10-CM | POA: Diagnosis not present

## 2024-05-26 DIAGNOSIS — B488 Other specified mycoses: Secondary | ICD-10-CM | POA: Diagnosis not present

## 2024-05-26 DIAGNOSIS — E063 Autoimmune thyroiditis: Secondary | ICD-10-CM | POA: Diagnosis not present

## 2024-05-26 DIAGNOSIS — R5383 Other fatigue: Secondary | ICD-10-CM | POA: Diagnosis not present

## 2024-05-26 DIAGNOSIS — R591 Generalized enlarged lymph nodes: Secondary | ICD-10-CM | POA: Diagnosis not present

## 2024-06-01 ENCOUNTER — Ambulatory Visit: Admitting: Family Medicine

## 2024-06-01 VITALS — BP 126/84 | Ht 66.0 in | Wt 247.0 lb

## 2024-06-01 DIAGNOSIS — M2141 Flat foot [pes planus] (acquired), right foot: Secondary | ICD-10-CM

## 2024-06-01 DIAGNOSIS — M2142 Flat foot [pes planus] (acquired), left foot: Secondary | ICD-10-CM | POA: Diagnosis not present

## 2024-06-01 DIAGNOSIS — F331 Major depressive disorder, recurrent, moderate: Secondary | ICD-10-CM | POA: Diagnosis not present

## 2024-06-01 NOTE — Patient Instructions (Addendum)
 We will refer you to the [facility] for custom orthotics. Start post tib rehab and do this daily on both sides.

## 2024-06-02 NOTE — Progress Notes (Signed)
 PCP: Mat Browning, MD  Subjective:   HPI: Patient is a 53 y.o. female here for bilateral arches.  Patient reports she's noticed her left long arch has been dropping more. She takes NSAIDs daily for pain. Has previously had orthotics which helped but took a lot of back and forth to get them just right. She would like new custom orthotics though more rigid ones than we offer.  Past Medical History:  Diagnosis Date   Allergic rhinitis    Anemia    Atrial fibrillation (HCC)    2006   Bronchial breathing    Chronic anxiety    Depression    Diabetes mellitus without complication (HCC)    Dysrhythmia    Elevated blood pressure    On therapy. Probably hypertension   Fibroid    Hx of cardiovascular stress test    a. Myoview  10/13:  no ischemia; EF 57%   Hypothyroid    Menorrhagia    2011   Obesity (BMI 30-39.9)    OSA (obstructive sleep apnea)    on CPAP   PCOS (polycystic ovarian syndrome)    PONV (postoperative nausea and vomiting)    C/o motion sickness   Prediabetes    Vitamin D  deficiency     Current Outpatient Medications on File Prior to Visit  Medication Sig Dispense Refill   albuterol  (VENTOLIN  HFA) 108 (90 Base) MCG/ACT inhaler Inhale 2 puffs into the lungs every 4 (four) hours as needed. 6.7 g 0   Ascorbic Acid  (VITAMIN C ) 1000 MG tablet Take 1,000 mg by mouth daily.     Blood Glucose Monitoring Suppl (FIFTY50 GLUCOSE METER 2.0) w/Device KIT Test as needed     cetirizine  (ZYRTEC ) 10 MG tablet Take 1 tablet (10 mg total) by mouth daily. 30 tablet 11   Cholecalciferol  (VITAMIN D3) 1.25 MG (50000 UT) CAPS Take 1 capsule by mouth once a week.     clotrimazole (LOTRIMIN) 1 % cream Apply topically daily in the afternoon.     diazepam (VALIUM) 5 MG tablet Take by mouth.     diclofenac  (VOLTAREN ) 75 MG EC tablet Take 1 tablet (75 mg total) by mouth 2 (two) times daily. Take for 5 days with food. Then take as needed. 40 tablet 0   escitalopram  (LEXAPRO ) 20 MG tablet  Take 1 tablet (20 mg total) by mouth daily. 30 tablet 11   fluticasone  (FLONASE ) 50 MCG/ACT nasal spray Place 2 sprays into both nostrils daily. 9.9 mL 2   ipratropium (ATROVENT ) 0.06 % nasal spray USE 2 SPRAYS IN BOTH  NOSTRILS 3 TIMES DAILY     itraconazole (SPORANOX) 100 MG capsule Take 100 mg by mouth daily. Start taking once daily for 1 week and the following week begin twice daily     levothyroxine  (SYNTHROID ) 137 MCG tablet Take 125 mcg by mouth daily before breakfast.     liothyronine (CYTOMEL) 5 MCG tablet Take 5 mcg by mouth daily. Pt takes 2 by mouth daily     metFORMIN  (GLUCOPHAGE -XR) 500 MG 24 hr tablet Take 500 mg by mouth daily with breakfast. Pt takes 4 by mouth daily     methocarbamol  (ROBAXIN ) 500 MG tablet Take 1 tablet (500 mg total) by mouth 2 (two) times daily. 20 tablet 0   montelukast  (SINGULAIR ) 10 MG tablet Take 1 tablet (10 mg total) by mouth at bedtime. 30 tablet 2   MOUNJARO 2.5 MG/0.5ML Pen INJECT 2.5 MG ONCE A WEEK FOR 28 DAYS     progesterone  (PROMETRIUM )  200 MG capsule Take 200 mg by mouth daily.     temazepam (RESTORIL) 15 MG capsule Take 15 mg by mouth once.     UNABLE TO FIND Place 1 Troche inside cheek daily. Med Name: Estrogen-Progesterone -Testerone     No current facility-administered medications on file prior to visit.    Past Surgical History:  Procedure Laterality Date   ATRIAL FIBRILLATION ABLATION N/A 01/17/2018   Procedure: ATRIAL FIBRILLATION ABLATION;  Surgeon: Inocencio Soyla Lunger, MD;  Location: [facility];  Service: Cardiovascular;  Laterality: N/A;   BLADDER SURGERY  1997   interstitial cystitis    BREAST REDUCTION SURGERY  06/2013   BREAST SURGERY     reduction   DILATION AND CURETTAGE, DIAGNOSTIC / THERAPEUTIC     ENDOMETRIAL ABLATION     ENDOMETRIAL ABLATION  08/2010   HAND SURGERY  2006   due to cat bite   KNEE ARTHROSCOPY WITH MEDIAL MENISECTOMY Left 04/06/2015   Procedure: LEFT KNEE ARTHROSCOPY WITH PARTIAL MEDIAL  MENISCECTOMY;  Surgeon: Kay CHRISTELLA Cummins, MD;  Location: [facility];  Service: Orthopedics;  Laterality: Left;   OVARIAN CYST REMOVAL  04/28/13   REDUCTION MAMMAPLASTY Bilateral 2014   RENAL BIOPSY, OPEN  2006   TONSILLECTOMY  2007    Allergies  Allergen Reactions   Multaq  [Dronedarone ] Swelling    Per patient   Benadryl [Diphenhydramine Hcl] Itching   Diphenhydramine Itching   Fish Oil     Makes hands raw, mouth and female area  Other reaction(s): Other   Folic Acid     Influenza Vaccines     Gives pt the flu and site of injection swells and hot   Influenza Virus Vaccine     Other reaction(s): Other   Latex     Burns skin   Oxycodone  Nausea And Vomiting   Pedi-Pre Tape Spray [Wound Dressing Adhesive] Itching    Burns skin, band-aid, ekg stickers  Burns skin, band-aid, ekg stickers   Shellfish Allergy     Other reaction(s): Contact Dermatitis (intolerance)   Shrimp Extract    Tape     Burns skin, band-aid, ekg stickers   Vitamin B12    Sulfa Antibiotics Rash    Other reaction(s): Other    BP 126/84   Ht 5' 6 (1.676 m)   Wt 247 lb (112 kg)   LMP 07/22/2019   BMI 39.87 kg/m       No data to display              No data to display              Objective:  Physical Exam:  Gen: NAD, comfortable in exam room  Bilateral feet/ankles: Pes planus L > R.  Mild medial subluxation on the left.  Widened forefoot with splaying between 2nd and 3rd toes.  No other gross deformity, swelling, ecchymoses.  Mild hallux rigidus Full range of motion ankles No tenderness to palpation currently. NV intact distally.   Assessment & Plan:  1. Bilateral pes planus - Reviewed home exercises for post tib. Will refer for custom orthotics as she'd like more rigid ones than we make here.  Follow up as needed.

## 2024-06-03 DIAGNOSIS — E119 Type 2 diabetes mellitus without complications: Secondary | ICD-10-CM | POA: Diagnosis not present

## 2024-06-08 DIAGNOSIS — F331 Major depressive disorder, recurrent, moderate: Secondary | ICD-10-CM | POA: Diagnosis not present

## 2024-06-09 ENCOUNTER — Encounter: Payer: Self-pay | Admitting: Internal Medicine

## 2024-06-11 DIAGNOSIS — M9901 Segmental and somatic dysfunction of cervical region: Secondary | ICD-10-CM | POA: Diagnosis not present

## 2024-06-11 DIAGNOSIS — M9905 Segmental and somatic dysfunction of pelvic region: Secondary | ICD-10-CM | POA: Diagnosis not present

## 2024-06-11 DIAGNOSIS — M9903 Segmental and somatic dysfunction of lumbar region: Secondary | ICD-10-CM | POA: Diagnosis not present

## 2024-06-11 DIAGNOSIS — M9902 Segmental and somatic dysfunction of thoracic region: Secondary | ICD-10-CM | POA: Diagnosis not present

## 2024-06-15 DIAGNOSIS — F331 Major depressive disorder, recurrent, moderate: Secondary | ICD-10-CM | POA: Diagnosis not present

## 2024-06-16 ENCOUNTER — Ambulatory Visit
Admission: RE | Admit: 2024-06-16 | Discharge: 2024-06-16 | Disposition: A | Source: Ambulatory Visit | Attending: Internal Medicine

## 2024-06-16 DIAGNOSIS — S83232A Complex tear of medial meniscus, current injury, left knee, initial encounter: Secondary | ICD-10-CM | POA: Diagnosis not present

## 2024-06-16 DIAGNOSIS — M25562 Pain in left knee: Secondary | ICD-10-CM

## 2024-06-17 ENCOUNTER — Other Ambulatory Visit: Payer: Self-pay | Admitting: Internal Medicine

## 2024-06-17 DIAGNOSIS — M1712 Unilateral primary osteoarthritis, left knee: Secondary | ICD-10-CM

## 2024-06-17 NOTE — Progress Notes (Signed)
 MRI reviewed. I called Neal to review results. MRI again shows tricompartmental arthritis most significant along the medial compartment. Posterior and medial mensical tears are redemonstrated as well.  There is mucoid degeneration of the ligaments but no evidence of disruption.  Additional treatment options were reviewed.  From a surgical standpoint, we discussed that arthroplasty will likely be recommended over arthroscopic meniscal repair.  She plans to establish care with Dr. Edna at [facility] pending insurance coverage.  She would also like to continue physical therapy.  New referral requested today, preferably in the [place] area.

## 2024-06-22 DIAGNOSIS — F331 Major depressive disorder, recurrent, moderate: Secondary | ICD-10-CM | POA: Diagnosis not present

## 2024-06-25 DIAGNOSIS — M9903 Segmental and somatic dysfunction of lumbar region: Secondary | ICD-10-CM | POA: Diagnosis not present

## 2024-06-25 DIAGNOSIS — M9901 Segmental and somatic dysfunction of cervical region: Secondary | ICD-10-CM | POA: Diagnosis not present

## 2024-06-25 DIAGNOSIS — M9905 Segmental and somatic dysfunction of pelvic region: Secondary | ICD-10-CM | POA: Diagnosis not present

## 2024-06-25 DIAGNOSIS — M9902 Segmental and somatic dysfunction of thoracic region: Secondary | ICD-10-CM | POA: Diagnosis not present

## 2024-06-29 DIAGNOSIS — F331 Major depressive disorder, recurrent, moderate: Secondary | ICD-10-CM | POA: Diagnosis not present

## 2024-07-04 DIAGNOSIS — E119 Type 2 diabetes mellitus without complications: Secondary | ICD-10-CM | POA: Diagnosis not present

## 2024-07-06 DIAGNOSIS — F331 Major depressive disorder, recurrent, moderate: Secondary | ICD-10-CM | POA: Diagnosis not present

## 2024-07-07 DIAGNOSIS — N951 Menopausal and female climacteric states: Secondary | ICD-10-CM | POA: Diagnosis not present

## 2024-07-07 DIAGNOSIS — R591 Generalized enlarged lymph nodes: Secondary | ICD-10-CM | POA: Diagnosis not present

## 2024-07-07 DIAGNOSIS — E7219 Other disorders of sulfur-bearing amino-acid metabolism: Secondary | ICD-10-CM | POA: Diagnosis not present

## 2024-07-07 DIAGNOSIS — E669 Obesity, unspecified: Secondary | ICD-10-CM | POA: Diagnosis not present

## 2024-07-07 DIAGNOSIS — E119 Type 2 diabetes mellitus without complications: Secondary | ICD-10-CM | POA: Diagnosis not present

## 2024-07-07 DIAGNOSIS — E063 Autoimmune thyroiditis: Secondary | ICD-10-CM | POA: Diagnosis not present

## 2024-07-07 DIAGNOSIS — E039 Hypothyroidism, unspecified: Secondary | ICD-10-CM | POA: Diagnosis not present

## 2024-07-07 DIAGNOSIS — R5383 Other fatigue: Secondary | ICD-10-CM | POA: Diagnosis not present

## 2024-07-07 DIAGNOSIS — B488 Other specified mycoses: Secondary | ICD-10-CM | POA: Diagnosis not present

## 2024-07-08 DIAGNOSIS — R5383 Other fatigue: Secondary | ICD-10-CM | POA: Diagnosis not present

## 2024-07-08 DIAGNOSIS — R591 Generalized enlarged lymph nodes: Secondary | ICD-10-CM | POA: Diagnosis not present

## 2024-07-08 DIAGNOSIS — B488 Other specified mycoses: Secondary | ICD-10-CM | POA: Diagnosis not present

## 2024-07-08 DIAGNOSIS — E063 Autoimmune thyroiditis: Secondary | ICD-10-CM | POA: Diagnosis not present

## 2024-07-09 DIAGNOSIS — L700 Acne vulgaris: Secondary | ICD-10-CM | POA: Diagnosis not present

## 2024-07-09 DIAGNOSIS — L573 Poikiloderma of Civatte: Secondary | ICD-10-CM | POA: Diagnosis not present

## 2024-07-09 DIAGNOSIS — D229 Melanocytic nevi, unspecified: Secondary | ICD-10-CM | POA: Diagnosis not present

## 2024-07-13 DIAGNOSIS — F331 Major depressive disorder, recurrent, moderate: Secondary | ICD-10-CM | POA: Diagnosis not present

## 2024-07-16 ENCOUNTER — Ambulatory Visit

## 2024-07-16 DIAGNOSIS — M9905 Segmental and somatic dysfunction of pelvic region: Secondary | ICD-10-CM | POA: Diagnosis not present

## 2024-07-16 DIAGNOSIS — M9902 Segmental and somatic dysfunction of thoracic region: Secondary | ICD-10-CM | POA: Diagnosis not present

## 2024-07-16 DIAGNOSIS — M9901 Segmental and somatic dysfunction of cervical region: Secondary | ICD-10-CM | POA: Diagnosis not present

## 2024-07-16 DIAGNOSIS — M9903 Segmental and somatic dysfunction of lumbar region: Secondary | ICD-10-CM | POA: Diagnosis not present

## 2024-07-20 DIAGNOSIS — F331 Major depressive disorder, recurrent, moderate: Secondary | ICD-10-CM | POA: Diagnosis not present

## 2024-07-20 NOTE — Therapy (Signed)
 OUTPATIENT PHYSICAL THERAPY KNEE EVALUATION/TREATMENT  Patient Name: Desiree Schultz MRN: [id number] DOB:01/29/1971, 53 y.o., female Today's Date: 07/21/2024  END OF SESSION:  PT End of Session - 07/21/24 1735     Visit Number 1    Number of Visits 24    Date for Recertification  10/13/24    PT Start Time 1735    PT Stop Time 1825    PT Time Calculation (min) 50 min    Activity Tolerance Patient tolerated treatment well    Behavior During Therapy [facility] for tasks assessed/performed          Past Medical History:  Diagnosis Date   Allergic rhinitis    Anemia    Atrial fibrillation (HCC)    2006   Bronchial breathing    Chronic anxiety    Depression    Diabetes mellitus without complication (HCC)    Dysrhythmia    Elevated blood pressure    On therapy. Probably hypertension   Fibroid    Hx of cardiovascular stress test    a. Myoview  10/13:  no ischemia; EF 57%   Hypothyroid    Menorrhagia    2011   Obesity (BMI 30-39.9)    OSA (obstructive sleep apnea)    on CPAP   PCOS (polycystic ovarian syndrome)    PONV (postoperative nausea and vomiting)    C/o motion sickness   Prediabetes    Vitamin D  deficiency    Past Surgical History:  Procedure Laterality Date   ATRIAL FIBRILLATION ABLATION N/A 01/17/2018   Procedure: ATRIAL FIBRILLATION ABLATION;  Surgeon: Inocencio Soyla Lunger, MD;  Location: [facility];  Service: Cardiovascular;  Laterality: N/A;   BLADDER SURGERY  1997   interstitial cystitis    BREAST REDUCTION SURGERY  06/2013   BREAST SURGERY     reduction   DILATION AND CURETTAGE, DIAGNOSTIC / THERAPEUTIC     ENDOMETRIAL ABLATION     ENDOMETRIAL ABLATION  08/2010   HAND SURGERY  2006   due to cat bite   KNEE ARTHROSCOPY WITH MEDIAL MENISECTOMY Left 04/06/2015   Procedure: LEFT KNEE ARTHROSCOPY WITH PARTIAL MEDIAL MENISCECTOMY;  Surgeon: Kay CHRISTELLA Cummins, MD;  Location: [facility] OR;  Service: Orthopedics;  Laterality: Left;   OVARIAN CYST REMOVAL  04/28/13    REDUCTION MAMMAPLASTY Bilateral 2014   RENAL BIOPSY, OPEN  2006   TONSILLECTOMY  2007   Patient Active Problem List   Diagnosis Date Noted   Paroxysmal A-fib (HCC) 01/17/2018   Chronic pain of right knee 05/28/2017   Controlled type 2 diabetes mellitus without complication, without long-term current use of insulin  (HCC) 08/30/2016   Menopause 08/30/2016   Metabolic syndrome 12/27/2015   Bilateral edema of lower extremity 09/27/2015   Proteinuria of undiagnosed cause 09/27/2015   Dorsocervical fat pad 08/24/2015   Prediabetes 02/10/2015   Left knee pain 12/21/2014   Depression 12/16/2014   Hypothyroidism 10/05/2014   Polycystic disease, ovaries 02/11/2014   Obesity 11/14/2011   OSA (obstructive sleep apnea) 01/09/2011   Atrial fibrillation (HCC) 12/01/2010   Healthcare maintenance 12/01/2010   GAIT ABNORMALITY 01/16/2010    PCP: Mat Browning, MD  REFERRING PROVIDER: Melvenia Manus BRAVO, MD  REFERRING DIAG:   RATIONALE FOR EVALUATION AND TREATMENT: Rehabilitation  THERAPY DIAG: Chronic pain of left knee  Other abnormalities of gait and mobility  Muscle weakness (generalized)  ONSET DATE: Chronic ( > 2 years)   FOLLOW-UP APPT SCHEDULED WITH REFERRING PROVIDER: Didn't Address   SUBJECTIVE:  SUBJECTIVE STATEMENT:    Patient is a 53 y.o. female reporting to OPPT with a chief concern of L knee pain.   PERTINENT HISTORY:   Patient presents to OPPT with chronic L knee pain and is seeking prehab prior to a planned left total knee replacement in July 2026. She reports that her physician identified a L meniscal tear via imaging (MRI) and moderate to severe osteoarthritic changes as the basis for surgical recommendation. Pain limits her ability to negotiate stairs, deep squat, and tolerate  prolonged standing or walking; symptoms are relieved only with sitting and rest. She currently manages pain with frequent NSAID use. She reports a sensation of a block preventing full terminal knee extension and notes intermittent catching and popping during walking or leg swinging.   She denies bowel or baldder dysfunction, saddle parasthesia, recent trauma, fever or chills.  PAIN:   Pain Intensity: Present: 1/10, Best: 1/10, Worst: 10/10 Pain location: L Knee Pain quality: constant and sharp  Radiating pain: Yes  Swelling: Yes  Popping, catching, locking: Yes  Numbness/Tingling: No Focal weakness or buckling: Yes How long can you stand: < 10 min  History of prior back, hip, or knee injury, pain, surgery, or therapy: No   PRECAUTIONS: Fall  WEIGHT BEARING RESTRICTIONS: No  FALLS: Has patient fallen in last 6 months? No  Living Environment Lives with: lives with their family Lives in: House/apartment Stairs: 3 External; No Rail Support   Prior level of function: Independent  Occupational demands: None   Hobbies: Fishing   Patient Goals: I would like to strengthen my knee in order to improve function before surgery    OBJECTIVE:   Patient Surveys   Extreme difficulty/unable (0), Quite a bit of difficulty (1), Moderate difficulty (2), Little difficulty (3), No difficulty (4) Survey date:  07/21/24  Any of your usual work, housework or school activities 1  2. Usual hobbies, recreational or sporting activities 1  3. Getting into/out of the bath 1  4. Walking between rooms 1  5. Putting on socks/shoes 3  6. Squatting  0  7. Lifting an object, like a bag of groceries from the floor 2  8. Performing light activities around your home 1  9. Performing heavy activities around your home 1  10. Getting into/out of a car 1  11. Walking 2 blocks 0  12. Walking 1 mile 0  13. Going up/down 10 stairs (1 flight) 0  14. Standing for 1 hour 0  15.  sitting for 1 hour 4   16. Running on even ground 0  17. Running on uneven ground 0  18. Making sharp turns while running fast 0  19. Hopping  0  20. Rolling over in bed 3  Score total:  19/80 - 23%     Cognition WNL    Gross Musculoskeletal Assessment Tremor: None Bulk: Normal Tone: Normal  GAIT: Distance walked: 20 m Assistive device utilized: None Level of assistance: Complete Independence Comments: Reciprocal Gait, decreased weight bearing on L knee  Posture:  AROM AROM (Normal range in degrees) AROM  Hip Right Left  Flexion (125) WFL WFL  Extension (15)    Abduction (40)    Adduction     Internal Rotation (45)    External Rotation (45)        Knee    Flexion (135) 135 130  Extension (0) 0 3      Ankle    Dorsiflexion (20)    Plantarflexion (50)  Inversion (35)    Eversion (15    (* = pain; Blank rows = not tested)  LE MMT: MMT (out of 5) Right Left  Hip flexion 4 4  Hip extension    Hip abduction 4 4  Hip adduction    Hip internal rotation 4+ 4+  Hip external rotation 4+ 4+  Knee flexion 4+ 4+  Knee extension 5 4+  Ankle dorsiflexion 5 5  Ankle plantarflexion 5 5  Ankle inversion    Ankle eversion    (* = pain; Blank rows = not tested)  Sensation Grossly intact to light touch bilateral LEs as determined by testing dermatomes L2-S2. Proprioception, and hot/cold testing deferred on this date.  Reflexes R/L Knee Jerk (L3/4): 2+/2+  Ankle Jerk (S1/2): 2+/2+   Muscle Length Hamstrings: R: Negative L: Negative   Palpation Location LEFT  RIGHT           Quadriceps    Medial Hamstrings 0   Lateral Hamstrings 0   Lateral Hamstring tendon    Medial Hamstring tendon    Quadriceps tendon 0   Patella 0   Patellar Tendon 0   Tibial Tuberosity    Medial joint line 0 0  Lateral joint line 0 0  MCL 0 0  LCL 0   Adductor Tubercle    Pes Anserine tendon    Infrapatellar fat pad    Fibular head    Popliteal fossa    (Blank rows = not tested) Graded on  0-4 scale (0 = no pain, 1 = pain, 2 = pain with wincing/grimacing/flinching, 3 = pain with withdrawal, 4 = unwilling to allow palpation), (Blank rows = not tested)  Passive Accessory Motion Deferred   VASCULAR Dorsalis pedis and posterior tibial pulses are palpable   SPECIAL TESTS  Ligamentous Stability  ACL: Lachman's:  L: Negative  PCL: Posterior Drawer:  L: Negative  MCL: Valgus Stress (30 degrees flexion):  L: Negative  LCL: Varus Stress (30 degrees flexion):  L: Negative  Meniscus Tests Medial Meniscus (Tibial ER): L: Negative for concordant pain Lateral Meniscus (Tibial IR):  L: Negative for concordant pain  Functional Tests:  Squatting: WNL - Able to lift 20# KB 30s STS: 20 Reps   TODAY'S TREATMENT: Reviewed HEP with return demonstration and patient verbalizing understanding:   Access Code: [email or web address] URL: [email or web address] Date: 07/21/2024 Prepared by: Lonni Pall  Exercises - Supine Active Straight Leg Raise  - 1 x daily - 3-4 x weekly - 2-3 sets - 10 reps - Sidelying Hip Abduction  - 1 x daily - 3-4 x weekly - 2-3 sets - 10 reps - Supine Bridge  - 1 x daily - 3-4 x weekly - 2-3 sets - 10 reps - Mini Squat  - 1 x daily - 3-4 x weekly - 2-3 sets - 10 reps - Sit to Stand with Arms Crossed  - 1 x daily - 3-4 x weekly - 2-3 sets - 10-12 reps - Supine Gluteus Stretch  - 2 x daily - 7 x weekly - 3 sets - 30 hold - Prone Quadriceps Stretch with Strap  - 2 x daily - 7 x weekly - 3 sets - 30 hold  PT education on proper stretch strap purchase and ideal positoning for quadriceps stretch. Educated patient on pain free ranges with squats in order to promote quad/gluteal strength without exacerbation of knee pain. Additional education on proper frequency/sets/reps following eval in order to maximize LE strength. Provided handout with PT review  of all exercises.   PATIENT EDUCATION:  Education details: HEP, Prognosis, Exercise Technique Person  educated: Patient Education method: Explanation, Demonstration, and Handouts Education comprehension: verbalized understanding and returned demonstration   HOME EXERCISE PROGRAM:   Access Code: [email or web address] URL: [email or web address] Date: 07/21/2024 Prepared by: Lonni Pall  Exercises - Supine Active Straight Leg Raise  - 1 x daily - 3-4 x weekly - 2-3 sets - 10 reps - Sidelying Hip Abduction  - 1 x daily - 3-4 x weekly - 2-3 sets - 10 reps - Supine Bridge  - 1 x daily - 3-4 x weekly - 2-3 sets - 10 reps - Mini Squat  - 1 x daily - 3-4 x weekly - 2-3 sets - 10 reps - Sit to Stand with Arms Crossed  - 1 x daily - 3-4 x weekly - 2-3 sets - 10-12 reps - Supine Gluteus Stretch  - 2 x daily - 7 x weekly - 3 sets - 30 hold - Prone Quadriceps Stretch with Strap  - 2 x daily - 7 x weekly - 3 sets - 30 hold  ASSESSMENT:  CLINICAL IMPRESSION: Patient is a 53 y.o. female who was seen today for physical therapy evaluation and treatment for chronic L knee pain. She presents with decreased LLE strength and pain with weight bearing activities. Patient able to perform normal functional activities with medication such as squatting, walking and lifting however moderate to severe pain is present throughout the activity. Per self report on LEFS (see below) patient has moderate to severe disability with functional tasks due to the L knee pain. Palpation significant for concordant pain in the posterior aspect of the knee along the joint line. Observation significant for minor muscular atrophy in the left calve muscles in comparison to the RLE. Patient will benefit from L knee strengthening in pain free ranges/positions. Based on today's performance patient will benefit from skilled PT in order to maximize return to PLOF and improve overall QoL.   OBJECTIVE IMPAIRMENTS: Abnormal gait, decreased activity tolerance, decreased endurance, difficulty walking, decreased strength, and pain.   ACTIVITY  LIMITATIONS: carrying, lifting, bending, standing, squatting, and stairs  PARTICIPATION LIMITATIONS: shopping, community activity, occupation, and yard work  PERSONAL FACTORS: Age, Past/current experiences, Time since onset of injury/illness/exacerbation, and 1-2 comorbidities: metabolic syndrome, Paroxysmal A-fib are also affecting patient's functional outcome.   REHAB POTENTIAL: Good  CLINICAL DECISION MAKING: Evolving/moderate complexity  EVALUATION COMPLEXITY: Moderate   GOALS: Goals reviewed with patient? Yes  SHORT TERM GOALS: Target date: 09/01/2024  Pt will be independent with HEP to improve strength and decrease knee pain to improve pain-free function at home and work. Baseline: 07/21/2024: Initial HEP provided Goal status: INITIAL   LONG TERM GOALS: Target date: 10/13/2024  Pt will increase 30s STS repetitions by 5 reps in order to demonstrate improvements in LE strength and endurance.  Baseline: 07/21/2024: 20 reps (age related norms - 15-16) Goal status: INITIAL  2.  Pt will decrease worst knee pain by at least 3 points on the NPRS in order to demonstrate clinically significant reduction in knee pain. Baseline: 07/21/2024: 10/10 NPS Goal status: INITIAL  3.  Pt will increase LEFS score by at least 9 points in order demonstrate clinically significant reduction in knee pain/disability.     Baseline: 07/21/2024: 19/80 (80/80 - no disability) Goal status: INITIAL  4.  Pt will increase strength of L knee flexion/extension by at least 1/2 MMT grade (5/5) in order to demonstrate improvement in strength and function  Baseline: 07/21/2024: L knee Flexion/Extension: 4+/4+ out of 5 respectively. Goal status: INITIAL  5. Pt will report ability to walk 10-15  with minimal pain ( =/< 3/10 NPS) in order to demonstrate improved weight bearing tolerance and function at work and with community related tasks. Baseline: 07/21/2024: n/a Goal status: INITIAL   PLAN: PT FREQUENCY:  1-2x/week  PT DURATION: 12 weeks  PLANNED INTERVENTIONS: Therapeutic exercises, Therapeutic activity, Neuromuscular re-education, Balance training, Gait training, Patient/Family education, Self Care, Joint mobilization, Joint manipulation, Vestibular training, Canalith repositioning, Orthotic/Fit training, DME instructions, Dry Needling, Electrical stimulation, Spinal manipulation, Spinal mobilization, Cryotherapy, Moist heat, Taping, Traction, Ultrasound, Ionotophoresis 4mg /ml Dexamethasone , Manual therapy, and Re-evaluation.  PLAN FOR NEXT SESSION: Review HEP, Initiate Knee Strengthening, hip Strengthening, gait training    Lonni Pall PT, DPT Physical Therapist- [place]  07/21/2024, 10:06 PM

## 2024-07-21 ENCOUNTER — Ambulatory Visit

## 2024-07-21 ENCOUNTER — Ambulatory Visit: Attending: Internal Medicine

## 2024-07-21 DIAGNOSIS — M1712 Unilateral primary osteoarthritis, left knee: Secondary | ICD-10-CM | POA: Diagnosis not present

## 2024-07-21 DIAGNOSIS — R2689 Other abnormalities of gait and mobility: Secondary | ICD-10-CM | POA: Diagnosis not present

## 2024-07-21 DIAGNOSIS — G8929 Other chronic pain: Secondary | ICD-10-CM | POA: Insufficient documentation

## 2024-07-21 DIAGNOSIS — M25562 Pain in left knee: Secondary | ICD-10-CM | POA: Diagnosis not present

## 2024-07-21 DIAGNOSIS — M6281 Muscle weakness (generalized): Secondary | ICD-10-CM | POA: Diagnosis not present

## 2024-07-23 ENCOUNTER — Ambulatory Visit

## 2024-07-27 DIAGNOSIS — F331 Major depressive disorder, recurrent, moderate: Secondary | ICD-10-CM | POA: Diagnosis not present

## 2024-07-28 ENCOUNTER — Ambulatory Visit

## 2024-08-04 ENCOUNTER — Ambulatory Visit

## 2024-08-06 ENCOUNTER — Ambulatory Visit

## 2024-08-11 ENCOUNTER — Ambulatory Visit

## 2024-08-11 DIAGNOSIS — G8929 Other chronic pain: Secondary | ICD-10-CM | POA: Diagnosis not present

## 2024-08-11 DIAGNOSIS — M25562 Pain in left knee: Secondary | ICD-10-CM | POA: Insufficient documentation

## 2024-08-11 DIAGNOSIS — R2689 Other abnormalities of gait and mobility: Secondary | ICD-10-CM | POA: Insufficient documentation

## 2024-08-11 DIAGNOSIS — M6281 Muscle weakness (generalized): Secondary | ICD-10-CM | POA: Insufficient documentation

## 2024-08-11 NOTE — Therapy (Signed)
 OUTPATIENT PHYSICAL THERAPY KNEE TREATMENT  Patient Name: Desiree Schultz MRN: [id number] DOB:12/06/1970, 53 y.o., female Today's Date: 08/11/2024  END OF SESSION:  PT End of Session - 08/11/24 1656     Visit Number 2    Number of Visits 24    Date for Recertification  10/13/24    PT Start Time 1657    PT Stop Time 1745    PT Time Calculation (min) 48 min    Activity Tolerance Patient tolerated treatment well    Behavior During Therapy [facility] for tasks assessed/performed          Past Medical History:  Diagnosis Date   Allergic rhinitis    Anemia    Atrial fibrillation (HCC)    2006   Bronchial breathing    Chronic anxiety    Depression    Diabetes mellitus without complication (HCC)    Dysrhythmia    Elevated blood pressure    On therapy. Probably hypertension   Fibroid    Hx of cardiovascular stress test    a. Myoview  10/13:  no ischemia; EF 57%   Hypothyroid    Menorrhagia    2011   Obesity (BMI 30-39.9)    OSA (obstructive sleep apnea)    on CPAP   PCOS (polycystic ovarian syndrome)    PONV (postoperative nausea and vomiting)    C/o motion sickness   Prediabetes    Vitamin D  deficiency    Past Surgical History:  Procedure Laterality Date   ATRIAL FIBRILLATION ABLATION N/A 01/17/2018   Procedure: ATRIAL FIBRILLATION ABLATION;  Surgeon: Inocencio Soyla Lunger, MD;  Location: [facility];  Service: Cardiovascular;  Laterality: N/A;   BLADDER SURGERY  1997   interstitial cystitis    BREAST REDUCTION SURGERY  06/2013   BREAST SURGERY     reduction   DILATION AND CURETTAGE, DIAGNOSTIC / THERAPEUTIC     ENDOMETRIAL ABLATION     ENDOMETRIAL ABLATION  08/2010   HAND SURGERY  2006   due to cat bite   KNEE ARTHROSCOPY WITH MEDIAL MENISECTOMY Left 04/06/2015   Procedure: LEFT KNEE ARTHROSCOPY WITH PARTIAL MEDIAL MENISCECTOMY;  Surgeon: Kay CHRISTELLA Cummins, MD;  Location: [facility] OR;  Service: Orthopedics;  Laterality: Left;   OVARIAN CYST REMOVAL  04/28/13   REDUCTION  MAMMAPLASTY Bilateral 2014   RENAL BIOPSY, OPEN  2006   TONSILLECTOMY  2007   Patient Active Problem List   Diagnosis Date Noted   Paroxysmal A-fib (HCC) 01/17/2018   Chronic pain of right knee 05/28/2017   Controlled type 2 diabetes mellitus without complication, without long-term current use of insulin  (HCC) 08/30/2016   Menopause 08/30/2016   Metabolic syndrome 12/27/2015   Bilateral edema of lower extremity 09/27/2015   Proteinuria of undiagnosed cause 09/27/2015   Dorsocervical fat pad 08/24/2015   Prediabetes 02/10/2015   Left knee pain 12/21/2014   Depression 12/16/2014   Hypothyroidism 10/05/2014   Polycystic disease, ovaries 02/11/2014   Obesity 11/14/2011   OSA (obstructive sleep apnea) 01/09/2011   Atrial fibrillation (HCC) 12/01/2010   Healthcare maintenance 12/01/2010   GAIT ABNORMALITY 01/16/2010    PCP: Mat Browning, MD  REFERRING PROVIDER: Mat Browning, MD  REFERRING DIAG:   RATIONALE FOR EVALUATION AND TREATMENT: Rehabilitation  THERAPY DIAG: Chronic pain of left knee  Other abnormalities of gait and mobility  Muscle weakness (generalized)  ONSET DATE: Chronic ( > 2 years)   FOLLOW-UP APPT SCHEDULED WITH REFERRING PROVIDER: Didn't Address   SUBJECTIVE:  SUBJECTIVE STATEMENT:    Patient is a 53 y.o. female reporting to OPPT with a chief concern of L knee pain.   PERTINENT HISTORY:   Patient presents to OPPT with chronic L knee pain and is seeking prehab prior to a planned left total knee replacement in July 2026. She reports that her physician identified a L meniscal tear via imaging (MRI) and moderate to severe osteoarthritic changes as the basis for surgical recommendation. Pain limits her ability to negotiate stairs, deep squat, and tolerate prolonged  standing or walking; symptoms are relieved only with sitting and rest. She currently manages pain with frequent NSAID use. She reports a sensation of a block preventing full terminal knee extension and notes intermittent catching and popping during walking or leg swinging.   She denies bowel or baldder dysfunction, saddle parasthesia, recent trauma, fever or chills.  PAIN:   Pain Intensity: Present: 1/10, Best: 1/10, Worst: 10/10 Pain location: L Knee Pain quality: constant and sharp  Radiating pain: Yes  Swelling: Yes  Popping, catching, locking: Yes  Numbness/Tingling: No Focal weakness or buckling: Yes How long can you stand: < 10 min  History of prior back, hip, or knee injury, pain, surgery, or therapy: No   PRECAUTIONS: Fall  WEIGHT BEARING RESTRICTIONS: No  FALLS: Has patient fallen in last 6 months? No  Living Environment Lives with: lives with their family Lives in: House/apartment Stairs: 3 External; No Rail Support   Prior level of function: Independent  Occupational demands: None   Hobbies: Fishing   Patient Goals: I would like to strengthen my knee in order to improve function before surgery    OBJECTIVE:   Patient Surveys   Extreme difficulty/unable (0), Quite a bit of difficulty (1), Moderate difficulty (2), Little difficulty (3), No difficulty (4) Survey date:  07/21/24  Any of your usual work, housework or school activities 1  2. Usual hobbies, recreational or sporting activities 1  3. Getting into/out of the bath 1  4. Walking between rooms 1  5. Putting on socks/shoes 3  6. Squatting  0  7. Lifting an object, like a bag of groceries from the floor 2  8. Performing light activities around your home 1  9. Performing heavy activities around your home 1  10. Getting into/out of a car 1  11. Walking 2 blocks 0  12. Walking 1 mile 0  13. Going up/down 10 stairs (1 flight) 0  14. Standing for 1 hour 0  15.  sitting for 1 hour 4  16.  Running on even ground 0  17. Running on uneven ground 0  18. Making sharp turns while running fast 0  19. Hopping  0  20. Rolling over in bed 3  Score total:  19/80 - 23%     Cognition WNL    Gross Musculoskeletal Assessment Tremor: None Bulk: Normal Tone: Normal  GAIT: Distance walked: 20 m Assistive device utilized: None Level of assistance: Complete Independence Comments: Reciprocal Gait, decreased weight bearing on L knee  Posture:  AROM AROM (Normal range in degrees) AROM  Hip Right Left  Flexion (125) WFL WFL  Extension (15)    Abduction (40)    Adduction     Internal Rotation (45)    External Rotation (45)        Knee    Flexion (135) 135 130  Extension (0) 0 3      Ankle    Dorsiflexion (20)    Plantarflexion (50)  Inversion (35)    Eversion (15    (* = pain; Blank rows = not tested)  LE MMT: MMT (out of 5) Right Left  Hip flexion 4 4  Hip extension    Hip abduction 4 4  Hip adduction    Hip internal rotation 4+ 4+  Hip external rotation 4+ 4+  Knee flexion 4+ 4+  Knee extension 5 4+  Ankle dorsiflexion 5 5  Ankle plantarflexion 5 5  Ankle inversion    Ankle eversion    (* = pain; Blank rows = not tested)  Sensation Grossly intact to light touch bilateral LEs as determined by testing dermatomes L2-S2. Proprioception, and hot/cold testing deferred on this date.  Reflexes R/L Knee Jerk (L3/4): 2+/2+  Ankle Jerk (S1/2): 2+/2+   Muscle Length Hamstrings: R: Negative L: Negative   Palpation Location LEFT  RIGHT           Quadriceps    Medial Hamstrings 0   Lateral Hamstrings 0   Lateral Hamstring tendon    Medial Hamstring tendon    Quadriceps tendon 0   Patella 0   Patellar Tendon 0   Tibial Tuberosity    Medial joint line 0 0  Lateral joint line 0 0  MCL 0 0  LCL 0   Adductor Tubercle    Pes Anserine tendon    Infrapatellar fat pad    Fibular head    Popliteal fossa    (Blank rows = not tested) Graded on 0-4  scale (0 = no pain, 1 = pain, 2 = pain with wincing/grimacing/flinching, 3 = pain with withdrawal, 4 = unwilling to allow palpation), (Blank rows = not tested)  Passive Accessory Motion Deferred   VASCULAR Dorsalis pedis and posterior tibial pulses are palpable   SPECIAL TESTS  Ligamentous Stability  ACL: Lachman's:  L: Negative  PCL: Posterior Drawer:  L: Negative  MCL: Valgus Stress (30 degrees flexion):  L: Negative  LCL: Varus Stress (30 degrees flexion):  L: Negative  Meniscus Tests Medial Meniscus (Tibial ER): L: Negative for concordant pain Lateral Meniscus (Tibial IR):  L: Negative for concordant pain  Functional Tests:  Squatting: WNL - Able to lift 20# KB 30s STS: 20 Reps    TODAY'S TREATMENT: DATE: 08/11/24  Subjective: Patient reports 2/10 NPS pain in L knee. She purchased stretch strap. No further or concerns.   Therapeutic Exercise:  Knee Extension: (Seat 3 with pillow on knee pad) 1 x 10 - 25#   2 x 10 - 45#  Hamstring Curl   1 x 10 - 45#   2 x 10 - 55#   Standing Calf Raise/Stretch on 1st Stair Step   3 x 15 reps   Therapeutic Activity:  NuStep L6-2 x 5 min x UE/LE (Seat 9) for LE endurance and strength; PT manually adjusted resistance throughout bout.   Forward Lunge at 1st stair step - BUE Support   R/L: 2 x 10 ea leg   Foam Roller Squats on Wall  3 x 10  TRX Squats   3 x 10    Landmine Squats  3 x 10 - 45# Barbell   PATIENT EDUCATION:  Education details: HEP, Prognosis, Exercise Technique Person educated: Patient Education method: Explanation, Demonstration, and Handouts Education comprehension: verbalized understanding and returned demonstration   HOME EXERCISE PROGRAM:   Access Code: [email or web address] URL: [email or web address] Date: 08/11/2024 Prepared by: Lonni Pall  Exercises - Supine Active Straight Leg Raise  - 1 x daily -  3-4 x weekly - 2-3 sets - 10 reps - Sidelying Hip Abduction  - 1 x daily -  3-4 x weekly - 2-3 sets - 10 reps - Supine Bridge  - 1 x daily - 3-4 x weekly - 2-3 sets - 10 reps - Mini Squat  - 1 x daily - 3-4 x weekly - 2-3 sets - 10 reps - Sit to Stand with Arms Crossed  - 1 x daily - 3-4 x weekly - 2-3 sets - 10-12 reps - Standing Bilateral Heel Raise on Step  - 1 x daily - 7 x weekly - 2-3 sets - 10-15 reps - Lunge with Counter Support  - 1 x daily - 3-4 x weekly - 2-3 sets - 10-15 reps - Supine Gluteus Stretch  - 2 x daily - 7 x weekly - 3 sets - 30 hold - Prone Quadriceps Stretch with Strap  - 2 x daily - 7 x weekly - 3 sets - 30 hold  Access Code: [email or web address] URL: [email or web address] Date: 07/21/2024 Prepared by: Lonni Pall  Exercises - Supine Active Straight Leg Raise  - 1 x daily - 3-4 x weekly - 2-3 sets - 10 reps - Sidelying Hip Abduction  - 1 x daily - 3-4 x weekly - 2-3 sets - 10 reps - Supine Bridge  - 1 x daily - 3-4 x weekly - 2-3 sets - 10 reps - Mini Squat  - 1 x daily - 3-4 x weekly - 2-3 sets - 10 reps - Sit to Stand with Arms Crossed  - 1 x daily - 3-4 x weekly - 2-3 sets - 10-12 reps - Supine Gluteus Stretch  - 2 x daily - 7 x weekly - 3 sets - 30 hold - Prone Quadriceps Stretch with Strap  - 2 x daily - 7 x weekly - 3 sets - 30 hold  ASSESSMENT:  CLINICAL IMPRESSION: Patient returned to OPPT in management of chronic L knee pain. Patient's pain has improved since initial evaluation however she endorses heavy use of NSAID. She tolerated all PT interventions without exacerbation of L knee pain. Today focused on functional strengthening with squat variations and against resistance. Patient continues to motivated with HEP.  Patient will benefit from L knee strengthening in pain free ranges/positions. Based on today's performance patient will benefit from skilled PT in order to maximize return to PLOF and improve overall QoL.  OBJECTIVE IMPAIRMENTS: Abnormal gait, decreased activity tolerance, decreased endurance, difficulty  walking, decreased strength, and pain.   ACTIVITY LIMITATIONS: carrying, lifting, bending, standing, squatting, and stairs  PARTICIPATION LIMITATIONS: shopping, community activity, occupation, and yard work  PERSONAL FACTORS: Age, Past/current experiences, Time since onset of injury/illness/exacerbation, and 1-2 comorbidities: metabolic syndrome, Paroxysmal A-fib are also affecting patient's functional outcome.   REHAB POTENTIAL: Good  CLINICAL DECISION MAKING: Evolving/moderate complexity  EVALUATION COMPLEXITY: Moderate   GOALS: Goals reviewed with patient? Yes  SHORT TERM GOALS: Target date: 09/01/2024  Pt will be independent with HEP to improve strength and decrease knee pain to improve pain-free function at home and work. Baseline: 07/21/2024: Initial HEP provided Goal status: INITIAL   LONG TERM GOALS: Target date: 10/13/2024  Pt will increase 30s STS repetitions by 5 reps in order to demonstrate improvements in LE strength and endurance.  Baseline: 07/21/2024: 20 reps (age related norms - 15-16) Goal status: INITIAL  2.  Pt will decrease worst knee pain by at least 3 points on the NPRS in order to demonstrate clinically significant reduction in  knee pain. Baseline: 07/21/2024: 10/10 NPS Goal status: INITIAL  3.  Pt will increase LEFS score by at least 9 points in order demonstrate clinically significant reduction in knee pain/disability.     Baseline: 07/21/2024: 19/80 (80/80 - no disability) Goal status: INITIAL  4.  Pt will increase strength of L knee flexion/extension by at least 1/2 MMT grade (5/5) in order to demonstrate improvement in strength and function  Baseline: 07/21/2024: L knee Flexion/Extension: 4+/4+ out of 5 respectively. Goal status: INITIAL  5. Pt will report ability to walk 10-15  with minimal pain ( =/< 3/10 NPS) in order to demonstrate improved weight bearing tolerance and function at work and with community related tasks. Baseline: 07/21/2024:  n/a Goal status: INITIAL   PLAN: PT FREQUENCY: 1-2x/week  PT DURATION: 12 weeks  PLANNED INTERVENTIONS: Therapeutic exercises, Therapeutic activity, Neuromuscular re-education, Balance training, Gait training, Patient/Family education, Self Care, Joint mobilization, Joint manipulation, Vestibular training, Canalith repositioning, Orthotic/Fit training, DME instructions, Dry Needling, Electrical stimulation, Spinal manipulation, Spinal mobilization, Cryotherapy, Moist heat, Taping, Traction, Ultrasound, Ionotophoresis 4mg /ml Dexamethasone , Manual therapy, and Re-evaluation.  PLAN FOR NEXT SESSION: Review HEP, Initiate Knee Strengthening, hip Strengthening, gait training    Lonni Pall PT, DPT Physical Therapist- [place]  08/11/2024, 5:04 PM

## 2024-08-13 ENCOUNTER — Ambulatory Visit

## 2024-08-13 DIAGNOSIS — M9903 Segmental and somatic dysfunction of lumbar region: Secondary | ICD-10-CM | POA: Diagnosis not present

## 2024-08-13 DIAGNOSIS — M9901 Segmental and somatic dysfunction of cervical region: Secondary | ICD-10-CM | POA: Diagnosis not present

## 2024-08-13 DIAGNOSIS — M9902 Segmental and somatic dysfunction of thoracic region: Secondary | ICD-10-CM | POA: Diagnosis not present

## 2024-08-13 DIAGNOSIS — M9905 Segmental and somatic dysfunction of pelvic region: Secondary | ICD-10-CM | POA: Diagnosis not present

## 2024-08-17 ENCOUNTER — Ambulatory Visit

## 2024-08-17 DIAGNOSIS — F331 Major depressive disorder, recurrent, moderate: Secondary | ICD-10-CM | POA: Diagnosis not present

## 2024-08-19 ENCOUNTER — Ambulatory Visit

## 2024-08-19 DIAGNOSIS — R2689 Other abnormalities of gait and mobility: Secondary | ICD-10-CM

## 2024-08-19 DIAGNOSIS — G8929 Other chronic pain: Secondary | ICD-10-CM

## 2024-08-19 DIAGNOSIS — M6281 Muscle weakness (generalized): Secondary | ICD-10-CM

## 2024-08-19 DIAGNOSIS — M25562 Pain in left knee: Secondary | ICD-10-CM | POA: Diagnosis not present

## 2024-08-19 NOTE — Therapy (Signed)
 OUTPATIENT PHYSICAL THERAPY KNEE TREATMENT  Patient Name: Desiree Schultz MRN: [id number] DOB:1970/09/16, 53 y.o., female Today's Date: 08/19/2024  END OF SESSION:  PT End of Session - 08/19/24 1645     Visit Number 3    Number of Visits 24    Date for Recertification  10/13/24    PT Start Time 1645    PT Stop Time 1730    PT Time Calculation (min) 45 min    Activity Tolerance Patient tolerated treatment well    Behavior During Therapy [facility] for tasks assessed/performed          Past Medical History:  Diagnosis Date   Allergic rhinitis    Anemia    Atrial fibrillation (HCC)    2006   Bronchial breathing    Chronic anxiety    Depression    Diabetes mellitus without complication (HCC)    Dysrhythmia    Elevated blood pressure    On therapy. Probably hypertension   Fibroid    Hx of cardiovascular stress test    a. Myoview  10/13:  no ischemia; EF 57%   Hypothyroid    Menorrhagia    2011   Obesity (BMI 30-39.9)    OSA (obstructive sleep apnea)    on CPAP   PCOS (polycystic ovarian syndrome)    PONV (postoperative nausea and vomiting)    C/o motion sickness   Prediabetes    Vitamin D  deficiency    Past Surgical History:  Procedure Laterality Date   ATRIAL FIBRILLATION ABLATION N/A 01/17/2018   Procedure: ATRIAL FIBRILLATION ABLATION;  Surgeon: Inocencio Soyla Lunger, MD;  Location: [facility];  Service: Cardiovascular;  Laterality: N/A;   BLADDER SURGERY  1997   interstitial cystitis    BREAST REDUCTION SURGERY  06/2013   BREAST SURGERY     reduction   DILATION AND CURETTAGE, DIAGNOSTIC / THERAPEUTIC     ENDOMETRIAL ABLATION     ENDOMETRIAL ABLATION  08/2010   HAND SURGERY  2006   due to cat bite   KNEE ARTHROSCOPY WITH MEDIAL MENISECTOMY Left 04/06/2015   Procedure: LEFT KNEE ARTHROSCOPY WITH PARTIAL MEDIAL MENISCECTOMY;  Surgeon: Kay CHRISTELLA Cummins, MD;  Location: [facility] OR;  Service: Orthopedics;  Laterality: Left;   OVARIAN CYST REMOVAL  04/28/13   REDUCTION  MAMMAPLASTY Bilateral 2014   RENAL BIOPSY, OPEN  2006   TONSILLECTOMY  2007   Patient Active Problem List   Diagnosis Date Noted   Paroxysmal A-fib (HCC) 01/17/2018   Chronic pain of right knee 05/28/2017   Controlled type 2 diabetes mellitus without complication, without long-term current use of insulin  (HCC) 08/30/2016   Menopause 08/30/2016   Metabolic syndrome 12/27/2015   Bilateral edema of lower extremity 09/27/2015   Proteinuria of undiagnosed cause 09/27/2015   Dorsocervical fat pad 08/24/2015   Prediabetes 02/10/2015   Left knee pain 12/21/2014   Depression 12/16/2014   Hypothyroidism 10/05/2014   Polycystic disease, ovaries 02/11/2014   Obesity 11/14/2011   OSA (obstructive sleep apnea) 01/09/2011   Atrial fibrillation (HCC) 12/01/2010   Healthcare maintenance 12/01/2010   GAIT ABNORMALITY 01/16/2010    PCP: Mat Browning, MD  REFERRING PROVIDER: Melvenia Manus BRAVO, MD  REFERRING DIAG:   RATIONALE FOR EVALUATION AND TREATMENT: Rehabilitation  THERAPY DIAG: Chronic pain of left knee  Other abnormalities of gait and mobility  Muscle weakness (generalized)  ONSET DATE: Chronic ( > 2 years)   FOLLOW-UP APPT SCHEDULED WITH REFERRING PROVIDER: Didn't Address   SUBJECTIVE:  SUBJECTIVE STATEMENT:    Patient is a 53 y.o. female reporting to OPPT with a chief concern of L knee pain.   PERTINENT HISTORY:   Patient presents to OPPT with chronic L knee pain and is seeking prehab prior to a planned left total knee replacement in July 2026. She reports that her physician identified a L meniscal tear via imaging (MRI) and moderate to severe osteoarthritic changes as the basis for surgical recommendation. Pain limits her ability to negotiate stairs, deep squat, and tolerate prolonged  standing or walking; symptoms are relieved only with sitting and rest. She currently manages pain with frequent NSAID use. She reports a sensation of a block preventing full terminal knee extension and notes intermittent catching and popping during walking or leg swinging.   She denies bowel or baldder dysfunction, saddle parasthesia, recent trauma, fever or chills.  PAIN:   Pain Intensity: Present: 1/10, Best: 1/10, Worst: 10/10 Pain location: L Knee Pain quality: constant and sharp  Radiating pain: Yes  Swelling: Yes  Popping, catching, locking: Yes  Numbness/Tingling: No Focal weakness or buckling: Yes How long can you stand: < 10 min  History of prior back, hip, or knee injury, pain, surgery, or therapy: No   PRECAUTIONS: Fall  WEIGHT BEARING RESTRICTIONS: No  FALLS: Has patient fallen in last 6 months? No  Living Environment Lives with: lives with their family Lives in: House/apartment Stairs: 3 External; No Rail Support   Prior level of function: Independent  Occupational demands: None   Hobbies: Fishing   Patient Goals: I would like to strengthen my knee in order to improve function before surgery    OBJECTIVE:   Patient Surveys   Extreme difficulty/unable (0), Quite a bit of difficulty (1), Moderate difficulty (2), Little difficulty (3), No difficulty (4) Survey date:  07/21/24  Any of your usual work, housework or school activities 1  2. Usual hobbies, recreational or sporting activities 1  3. Getting into/out of the bath 1  4. Walking between rooms 1  5. Putting on socks/shoes 3  6. Squatting  0  7. Lifting an object, like a bag of groceries from the floor 2  8. Performing light activities around your home 1  9. Performing heavy activities around your home 1  10. Getting into/out of a car 1  11. Walking 2 blocks 0  12. Walking 1 mile 0  13. Going up/down 10 stairs (1 flight) 0  14. Standing for 1 hour 0  15.  sitting for 1 hour 4  16.  Running on even ground 0  17. Running on uneven ground 0  18. Making sharp turns while running fast 0  19. Hopping  0  20. Rolling over in bed 3  Score total:  19/80 - 23%     Cognition WNL    Gross Musculoskeletal Assessment Tremor: None Bulk: Normal Tone: Normal  GAIT: Distance walked: 20 m Assistive device utilized: None Level of assistance: Complete Independence Comments: Reciprocal Gait, decreased weight bearing on L knee  Posture:  AROM AROM (Normal range in degrees) AROM  Hip Right Left  Flexion (125) WFL WFL  Extension (15)    Abduction (40)    Adduction     Internal Rotation (45)    External Rotation (45)        Knee    Flexion (135) 135 130  Extension (0) 0 3      Ankle    Dorsiflexion (20)    Plantarflexion (50)  Inversion (35)    Eversion (15    (* = pain; Blank rows = not tested)  LE MMT: MMT (out of 5) Right Left  Hip flexion 4 4  Hip extension    Hip abduction 4 4  Hip adduction    Hip internal rotation 4+ 4+  Hip external rotation 4+ 4+  Knee flexion 4+ 4+  Knee extension 5 4+  Ankle dorsiflexion 5 5  Ankle plantarflexion 5 5  Ankle inversion    Ankle eversion    (* = pain; Blank rows = not tested)  Sensation Grossly intact to light touch bilateral LEs as determined by testing dermatomes L2-S2. Proprioception, and hot/cold testing deferred on this date.  Reflexes R/L Knee Jerk (L3/4): 2+/2+  Ankle Jerk (S1/2): 2+/2+   Muscle Length Hamstrings: R: Negative L: Negative   Palpation Location LEFT  RIGHT           Quadriceps    Medial Hamstrings 0   Lateral Hamstrings 0   Lateral Hamstring tendon    Medial Hamstring tendon    Quadriceps tendon 0   Patella 0   Patellar Tendon 0   Tibial Tuberosity    Medial joint line 0 0  Lateral joint line 0 0  MCL 0 0  LCL 0   Adductor Tubercle    Pes Anserine tendon    Infrapatellar fat pad    Fibular head    Popliteal fossa    (Blank rows = not tested) Graded on 0-4  scale (0 = no pain, 1 = pain, 2 = pain with wincing/grimacing/flinching, 3 = pain with withdrawal, 4 = unwilling to allow palpation), (Blank rows = not tested)  Passive Accessory Motion Deferred   VASCULAR Dorsalis pedis and posterior tibial pulses are palpable   SPECIAL TESTS  Ligamentous Stability  ACL: Lachman's:  L: Negative  PCL: Posterior Drawer:  L: Negative  MCL: Valgus Stress (30 degrees flexion):  L: Negative  LCL: Varus Stress (30 degrees flexion):  L: Negative  Meniscus Tests Medial Meniscus (Tibial ER): L: Negative for concordant pain Lateral Meniscus (Tibial IR):  L: Negative for concordant pain  Functional Tests:  Squatting: WNL - Able to lift 20# KB 30s STS: 20 Reps    TODAY'S TREATMENT: DATE: 08/19/2024  Subjective: She reports moderate soreness following last PT session in bilateral thighs. Patient reports persistent pain in the L knee currently rated at 3-4/10 NPS. She hasn't used the stretch strap yet. No further or concerns.   Therapeutic Exercise:  Knee Extension: (Seat 3 with pillow on knee pad) 1 x 10 - 25#   2 x 10 - 40#  Hamstring Curl   1 x 10 - 35#   2 x 10 - 45#   Standing Calf Raise/Stretch on 1st Stair Step   3 x 15 reps   Therapeutic Activity:  NuStep L6-2 x 6 min x UE/LE (Seat 9) for LE endurance and strength; PT manually adjusted resistance throughout bout.   TRX Squats   2 x 10   Kettle Bell Squats   3 x 10 - 20# Kb   Lateral Stepping against resistance  2 x 10' - Blue TB around ankle  2 x 10' - Blue TB around ankle 2 x 10' - Blue TB Around ankle, Red TB around ankle  Manual Therapy (15 min billed):  Moderate STM applied to quadricep musculature for pain modulation and decrease muscle tension. Myofascial release, trigger point and contract and relax techniques utilized. Pt endorsed  improvements in pain following intervention.    PATIENT EDUCATION:  Education details: HEP, Prognosis, Exercise Technique Person  educated: Patient Education method: Explanation, Demonstration, and Handouts Education comprehension: verbalized understanding and returned demonstration   HOME EXERCISE PROGRAM:   Access Code: [email or web address] URL: [email or web address] Date: 08/11/2024 Prepared by: Lonni Pall  Exercises - Supine Active Straight Leg Raise  - 1 x daily - 3-4 x weekly - 2-3 sets - 10 reps - Sidelying Hip Abduction  - 1 x daily - 3-4 x weekly - 2-3 sets - 10 reps - Supine Bridge  - 1 x daily - 3-4 x weekly - 2-3 sets - 10 reps - Mini Squat  - 1 x daily - 3-4 x weekly - 2-3 sets - 10 reps - Sit to Stand with Arms Crossed  - 1 x daily - 3-4 x weekly - 2-3 sets - 10-12 reps - Standing Bilateral Heel Raise on Step  - 1 x daily - 7 x weekly - 2-3 sets - 10-15 reps - Lunge with Counter Support  - 1 x daily - 3-4 x weekly - 2-3 sets - 10-15 reps - Supine Gluteus Stretch  - 2 x daily - 7 x weekly - 3 sets - 30 hold - Prone Quadriceps Stretch with Strap  - 2 x daily - 7 x weekly - 3 sets - 30 hold  Access Code: [email or web address] URL: [email or web address] Date: 07/21/2024 Prepared by: Lonni Pall  Exercises - Supine Active Straight Leg Raise  - 1 x daily - 3-4 x weekly - 2-3 sets - 10 reps - Sidelying Hip Abduction  - 1 x daily - 3-4 x weekly - 2-3 sets - 10 reps - Supine Bridge  - 1 x daily - 3-4 x weekly - 2-3 sets - 10 reps - Mini Squat  - 1 x daily - 3-4 x weekly - 2-3 sets - 10 reps - Sit to Stand with Arms Crossed  - 1 x daily - 3-4 x weekly - 2-3 sets - 10-12 reps - Supine Gluteus Stretch  - 2 x daily - 7 x weekly - 3 sets - 30 hold - Prone Quadriceps Stretch with Strap  - 2 x daily - 7 x weekly - 3 sets - 30 hold  ASSESSMENT:  CLINICAL IMPRESSION: Patient returned to OPPT in management of chronic L knee pain. Session focused on improving knee strength and manual techniques to relieve quadricep muscle tension. She tolerated all interventions without exacerbation of L knee pain,  some VC needed for proper squat form. She presents with pain at end range of her squat; PT advise to stay in a pain free range in order to reduce further injury. Pt endorsed improved pain following contract and relax manual techniques to R/L quadriceps. She will be taking a two week hiatus from formal PT due to holiday contrainsts. PT encouraged adherence to HEP in order to maintain progress and muscle strength. Based on today's performance patient will benefit from skilled PT in order to maximize return to PLOF and improve overall QoL.  OBJECTIVE IMPAIRMENTS: Abnormal gait, decreased activity tolerance, decreased endurance, difficulty walking, decreased strength, and pain.   ACTIVITY LIMITATIONS: carrying, lifting, bending, standing, squatting, and stairs  PARTICIPATION LIMITATIONS: shopping, community activity, occupation, and yard work  PERSONAL FACTORS: Age, Past/current experiences, Time since onset of injury/illness/exacerbation, and 1-2 comorbidities: metabolic syndrome, Paroxysmal A-fib are also affecting patient's functional outcome.   REHAB POTENTIAL: Good  CLINICAL DECISION MAKING: Evolving/moderate complexity  EVALUATION COMPLEXITY: Moderate   GOALS:  Goals reviewed with patient? Yes  SHORT TERM GOALS: Target date: 09/01/2024  Pt will be independent with HEP to improve strength and decrease knee pain to improve pain-free function at home and work. Baseline: 07/21/2024: Initial HEP provided Goal status: INITIAL   LONG TERM GOALS: Target date: 10/13/2024  Pt will increase 30s STS repetitions by 5 reps in order to demonstrate improvements in LE strength and endurance.  Baseline: 07/21/2024: 20 reps (age related norms - 15-16) Goal status: INITIAL  2.  Pt will decrease worst knee pain by at least 3 points on the NPRS in order to demonstrate clinically significant reduction in knee pain. Baseline: 07/21/2024: 10/10 NPS Goal status: INITIAL  3.  Pt will increase LEFS score  by at least 9 points in order demonstrate clinically significant reduction in knee pain/disability.     Baseline: 07/21/2024: 19/80 (80/80 - no disability) Goal status: INITIAL  4.  Pt will increase strength of L knee flexion/extension by at least 1/2 MMT grade (5/5) in order to demonstrate improvement in strength and function  Baseline: 07/21/2024: L knee Flexion/Extension: 4+/4+ out of 5 respectively. Goal status: INITIAL  5. Pt will report ability to walk 10-15  with minimal pain ( =/< 3/10 NPS) in order to demonstrate improved weight bearing tolerance and function at work and with community related tasks. Baseline: 07/21/2024: n/a Goal status: INITIAL   PLAN: PT FREQUENCY: 1-2x/week  PT DURATION: 12 weeks  PLANNED INTERVENTIONS: Therapeutic exercises, Therapeutic activity, Neuromuscular re-education, Balance training, Gait training, Patient/Family education, Self Care, Joint mobilization, Joint manipulation, Vestibular training, Canalith repositioning, Orthotic/Fit training, DME instructions, Dry Needling, Electrical stimulation, Spinal manipulation, Spinal mobilization, Cryotherapy, Moist heat, Taping, Traction, Ultrasound, Ionotophoresis 4mg /ml Dexamethasone , Manual therapy, and Re-evaluation.  PLAN FOR NEXT SESSION: Review HEP, Progressing Knee Strengthening, hip Strengthening, gait training    Lonni Pall PT, DPT Physical Therapist- [place]  08/19/2024, 5:38 PM

## 2024-08-25 ENCOUNTER — Ambulatory Visit

## 2024-08-25 DIAGNOSIS — F331 Major depressive disorder, recurrent, moderate: Secondary | ICD-10-CM | POA: Diagnosis not present

## 2024-08-31 DIAGNOSIS — F331 Major depressive disorder, recurrent, moderate: Secondary | ICD-10-CM | POA: Diagnosis not present

## 2024-09-01 ENCOUNTER — Ambulatory Visit

## 2024-09-07 ENCOUNTER — Ambulatory Visit

## 2024-09-09 ENCOUNTER — Ambulatory Visit: Attending: Internal Medicine

## 2024-09-09 DIAGNOSIS — M25562 Pain in left knee: Secondary | ICD-10-CM | POA: Diagnosis present

## 2024-09-09 DIAGNOSIS — G8929 Other chronic pain: Secondary | ICD-10-CM | POA: Diagnosis present

## 2024-09-09 DIAGNOSIS — R2689 Other abnormalities of gait and mobility: Secondary | ICD-10-CM | POA: Diagnosis present

## 2024-09-09 DIAGNOSIS — M6281 Muscle weakness (generalized): Secondary | ICD-10-CM | POA: Diagnosis present

## 2024-09-09 NOTE — Therapy (Signed)
 " OUTPATIENT PHYSICAL THERAPY KNEE TREATMENT  Patient Name: Desiree Schultz MRN: [id number] DOB:Nov 27, 1970, 54 y.o., female Today's Date: 09/09/2024  END OF SESSION:  PT End of Session - 09/09/24 1647     Visit Number 4    Number of Visits 24    Date for Recertification  10/13/24    PT Start Time 1645    PT Stop Time 1725    PT Time Calculation (min) 40 min    Activity Tolerance Patient tolerated treatment well    Behavior During Therapy [facility] for tasks assessed/performed          Past Medical History:  Diagnosis Date   Allergic rhinitis    Anemia    Atrial fibrillation (HCC)    2006   Bronchial breathing    Chronic anxiety    Depression    Diabetes mellitus without complication (HCC)    Dysrhythmia    Elevated blood pressure    On therapy. Probably hypertension   Fibroid    Hx of cardiovascular stress test    a. Myoview  10/13:  no ischemia; EF 57%   Hypothyroid    Menorrhagia    2011   Obesity (BMI 30-39.9)    OSA (obstructive sleep apnea)    on CPAP   PCOS (polycystic ovarian syndrome)    PONV (postoperative nausea and vomiting)    C/o motion sickness   Prediabetes    Vitamin D  deficiency    Past Surgical History:  Procedure Laterality Date   ATRIAL FIBRILLATION ABLATION N/A 01/17/2018   Procedure: ATRIAL FIBRILLATION ABLATION;  Surgeon: Inocencio Soyla Lunger, MD;  Location: [facility];  Service: Cardiovascular;  Laterality: N/A;   BLADDER SURGERY  1997   interstitial cystitis    BREAST REDUCTION SURGERY  06/2013   BREAST SURGERY     reduction   DILATION AND CURETTAGE, DIAGNOSTIC / THERAPEUTIC     ENDOMETRIAL ABLATION     ENDOMETRIAL ABLATION  08/2010   HAND SURGERY  2006   due to cat bite   KNEE ARTHROSCOPY WITH MEDIAL MENISECTOMY Left 04/06/2015   Procedure: LEFT KNEE ARTHROSCOPY WITH PARTIAL MEDIAL MENISCECTOMY;  Surgeon: Kay CHRISTELLA Cummins, MD;  Location: [facility] OR;  Service: Orthopedics;  Laterality: Left;   OVARIAN CYST REMOVAL  04/28/13   REDUCTION  MAMMAPLASTY Bilateral 2014   RENAL BIOPSY, OPEN  2006   TONSILLECTOMY  2007   Patient Active Problem List   Diagnosis Date Noted   Paroxysmal A-fib (HCC) 01/17/2018   Chronic pain of right knee 05/28/2017   Controlled type 2 diabetes mellitus without complication, without long-term current use of insulin  (HCC) 08/30/2016   Menopause 08/30/2016   Metabolic syndrome 12/27/2015   Bilateral edema of lower extremity 09/27/2015   Proteinuria of undiagnosed cause 09/27/2015   Dorsocervical fat pad 08/24/2015   Prediabetes 02/10/2015   Left knee pain 12/21/2014   Depression 12/16/2014   Hypothyroidism 10/05/2014   Polycystic disease, ovaries 02/11/2014   Obesity 11/14/2011   OSA (obstructive sleep apnea) 01/09/2011   Atrial fibrillation (HCC) 12/01/2010   Healthcare maintenance 12/01/2010   GAIT ABNORMALITY 01/16/2010    PCP: Mat Browning, MD  REFERRING PROVIDER: Melvenia Manus BRAVO, MD  REFERRING DIAG:   RATIONALE FOR EVALUATION AND TREATMENT: Rehabilitation  THERAPY DIAG: Chronic pain of left knee  Other abnormalities of gait and mobility  Muscle weakness (generalized)  ONSET DATE: Chronic ( > 2 years)   FOLLOW-UP APPT SCHEDULED WITH REFERRING PROVIDER: Didn't Address   SUBJECTIVE:  SUBJECTIVE STATEMENT:    Patient is a 54 y.o. female reporting to OPPT with a chief concern of L knee pain.   PERTINENT HISTORY:   Patient presents to OPPT with chronic L knee pain and is seeking prehab prior to a planned left total knee replacement in July 2026. She reports that her physician identified a L meniscal tear via imaging (MRI) and moderate to severe osteoarthritic changes as the basis for surgical recommendation. Pain limits her ability to negotiate stairs, deep squat, and tolerate prolonged  standing or walking; symptoms are relieved only with sitting and rest. She currently manages pain with frequent NSAID use. She reports a sensation of a block preventing full terminal knee extension and notes intermittent catching and popping during walking or leg swinging.   She denies bowel or baldder dysfunction, saddle parasthesia, recent trauma, fever or chills.  PAIN:   Pain Intensity: Present: 1/10, Best: 1/10, Worst: 10/10 Pain location: L Knee Pain quality: constant and sharp  Radiating pain: Yes  Swelling: Yes  Popping, catching, locking: Yes  Numbness/Tingling: No Focal weakness or buckling: Yes How long can you stand: < 10 min  History of prior back, hip, or knee injury, pain, surgery, or therapy: No   PRECAUTIONS: Fall  WEIGHT BEARING RESTRICTIONS: No  FALLS: Has patient fallen in last 6 months? No  Living Environment Lives with: lives with their family Lives in: House/apartment Stairs: 3 External; No Rail Support   Prior level of function: Independent  Occupational demands: None   Hobbies: Fishing   Patient Goals: I would like to strengthen my knee in order to improve function before surgery    OBJECTIVE:   Patient Surveys   Extreme difficulty/unable (0), Quite a bit of difficulty (1), Moderate difficulty (2), Little difficulty (3), No difficulty (4) Survey date:  07/21/24  Any of your usual work, housework or school activities 1  2. Usual hobbies, recreational or sporting activities 1  3. Getting into/out of the bath 1  4. Walking between rooms 1  5. Putting on socks/shoes 3  6. Squatting  0  7. Lifting an object, like a bag of groceries from the floor 2  8. Performing light activities around your home 1  9. Performing heavy activities around your home 1  10. Getting into/out of a car 1  11. Walking 2 blocks 0  12. Walking 1 mile 0  13. Going up/down 10 stairs (1 flight) 0  14. Standing for 1 hour 0  15.  sitting for 1 hour 4  16.  Running on even ground 0  17. Running on uneven ground 0  18. Making sharp turns while running fast 0  19. Hopping  0  20. Rolling over in bed 3  Score total:  19/80 - 23%     Cognition WNL    Gross Musculoskeletal Assessment Tremor: None Bulk: Normal Tone: Normal  GAIT: Distance walked: 20 m Assistive device utilized: None Level of assistance: Complete Independence Comments: Reciprocal Gait, decreased weight bearing on L knee  Posture:  AROM AROM (Normal range in degrees) AROM  Hip Right Left  Flexion (125) WFL WFL  Extension (15)    Abduction (40)    Adduction     Internal Rotation (45)    External Rotation (45)        Knee    Flexion (135) 135 130  Extension (0) 0 3      Ankle    Dorsiflexion (20)    Plantarflexion (50)  Inversion (35)    Eversion (15    (* = pain; Blank rows = not tested)  LE MMT: MMT (out of 5) Right Left  Hip flexion 4 4  Hip extension    Hip abduction 4 4  Hip adduction    Hip internal rotation 4+ 4+  Hip external rotation 4+ 4+  Knee flexion 4+ 4+  Knee extension 5 4+  Ankle dorsiflexion 5 5  Ankle plantarflexion 5 5  Ankle inversion    Ankle eversion    (* = pain; Blank rows = not tested)  Sensation Grossly intact to light touch bilateral LEs as determined by testing dermatomes L2-S2. Proprioception, and hot/cold testing deferred on this date.  Reflexes R/L Knee Jerk (L3/4): 2+/2+  Ankle Jerk (S1/2): 2+/2+   Muscle Length Hamstrings: R: Negative L: Negative   Palpation Location LEFT  RIGHT           Quadriceps    Medial Hamstrings 0   Lateral Hamstrings 0   Lateral Hamstring tendon    Medial Hamstring tendon    Quadriceps tendon 0   Patella 0   Patellar Tendon 0   Tibial Tuberosity    Medial joint line 0 0  Lateral joint line 0 0  MCL 0 0  LCL 0   Adductor Tubercle    Pes Anserine tendon    Infrapatellar fat pad    Fibular head    Popliteal fossa    (Blank rows = not tested) Graded on 0-4  scale (0 = no pain, 1 = pain, 2 = pain with wincing/grimacing/flinching, 3 = pain with withdrawal, 4 = unwilling to allow palpation), (Blank rows = not tested)  Passive Accessory Motion Deferred   VASCULAR Dorsalis pedis and posterior tibial pulses are palpable   SPECIAL TESTS  Ligamentous Stability  ACL: Lachman's:  L: Negative  PCL: Posterior Drawer:  L: Negative  MCL: Valgus Stress (30 degrees flexion):  L: Negative  LCL: Varus Stress (30 degrees flexion):  L: Negative  Meniscus Tests Medial Meniscus (Tibial ER): L: Negative for concordant pain Lateral Meniscus (Tibial IR):  L: Negative for concordant pain  Functional Tests:  Squatting: WNL - Able to lift 20# KB 30s STS: 20 Reps    TODAY'S TREATMENT: DATE: 09/09/2024  Subjective: Patient reports continued stiffness and persistent pain; 2/10 NPS in bilateral knees. No further or concerns.   Therapeutic Exercise:  Knee Extension: (Seat 3 with pillow on knee pad) 1 x 10 - 20# 2 x 10 - 30#  Hamstring Curl   1 x 10 - 35#   2 x 10 - 45#   Supine Bridge - LE extended on heels  3 x 10   Supine Quadricep Stretch with Strap  R/L: 30s/bout x 2 in order to improve ROM/muscle tension  Therapeutic Activity:  NuStep L6-2 x 6 min x LE (Seat 9) - Pace Partner mode for LE endurance and strength; PT manually adjusted resistance per patient tolerance.  TRX Squats   2 x 10   Manual Therapy (10 min billed):  Moderate STM applied to quadricep musculature for pain modulation and decrease muscle tension. Myofascial release, trigger point and contract and relax techniques utilized. Pt endorsed improvements in pain following intervention.   Tibiofemoral Distraction   L: 30s/bout x 3 in order to improve Knee ROM   PATIENT EDUCATION:  Education details: HEP, Prognosis, Exercise Technique Person educated: Patient Education method: Explanation, Demonstration, and Handouts Education comprehension: verbalized understanding  and returned demonstration  HOME EXERCISE PROGRAM:   Access Code: [email or web address] URL: [email or web address] Date: 08/11/2024 Prepared by: Lonni Pall  Exercises - Supine Active Straight Leg Raise  - 1 x daily - 3-4 x weekly - 2-3 sets - 10 reps - Sidelying Hip Abduction  - 1 x daily - 3-4 x weekly - 2-3 sets - 10 reps - Supine Bridge  - 1 x daily - 3-4 x weekly - 2-3 sets - 10 reps - Mini Squat  - 1 x daily - 3-4 x weekly - 2-3 sets - 10 reps - Sit to Stand with Arms Crossed  - 1 x daily - 3-4 x weekly - 2-3 sets - 10-12 reps - Standing Bilateral Heel Raise on Step  - 1 x daily - 7 x weekly - 2-3 sets - 10-15 reps - Lunge with Counter Support  - 1 x daily - 3-4 x weekly - 2-3 sets - 10-15 reps - Supine Gluteus Stretch  - 2 x daily - 7 x weekly - 3 sets - 30 hold - Prone Quadriceps Stretch with Strap  - 2 x daily - 7 x weekly - 3 sets - 30 hold  Access Code: [email or web address] URL: [email or web address] Date: 07/21/2024 Prepared by: Lonni Pall  Exercises - Supine Active Straight Leg Raise  - 1 x daily - 3-4 x weekly - 2-3 sets - 10 reps - Sidelying Hip Abduction  - 1 x daily - 3-4 x weekly - 2-3 sets - 10 reps - Supine Bridge  - 1 x daily - 3-4 x weekly - 2-3 sets - 10 reps - Mini Squat  - 1 x daily - 3-4 x weekly - 2-3 sets - 10 reps - Sit to Stand with Arms Crossed  - 1 x daily - 3-4 x weekly - 2-3 sets - 10-12 reps - Supine Gluteus Stretch  - 2 x daily - 7 x weekly - 3 sets - 30 hold - Prone Quadriceps Stretch with Strap  - 2 x daily - 7 x weekly - 3 sets - 30 hold  ASSESSMENT:  CLINICAL IMPRESSION: Patient returned to OPPT in management of chronic L knee pain. Session focused on improving knee strength and manual techniques to relieve quadricep muscle tension. She tolerated all interventions without exacerbation of L knee pain. Good carryover with squat form; no verbal cues from PT. One instance of L knee Locking sensation and unable to TKE; mitigated  with seated distraction and tibial ER/IR. Educated patient on supine quad stretch with use of strap, good return demo and no pain in the L knee. Manual techniques to L quadriceps in order to decrease muscle tension. PT encouraged adherence to HEP in order to maintain progress and muscle strength. Based on today's performance patient will benefit from skilled PT in order to maximize return to PLOF and improve overall QoL.  OBJECTIVE IMPAIRMENTS: Abnormal gait, decreased activity tolerance, decreased endurance, difficulty walking, decreased strength, and pain.   ACTIVITY LIMITATIONS: carrying, lifting, bending, standing, squatting, and stairs  PARTICIPATION LIMITATIONS: shopping, community activity, occupation, and yard work  PERSONAL FACTORS: Age, Past/current experiences, Time since onset of injury/illness/exacerbation, and 1-2 comorbidities: metabolic syndrome, Paroxysmal A-fib are also affecting patient's functional outcome.   REHAB POTENTIAL: Good  CLINICAL DECISION MAKING: Evolving/moderate complexity  EVALUATION COMPLEXITY: Moderate   GOALS: Goals reviewed with patient? Yes  SHORT TERM GOALS: Target date: 09/01/2024  Pt will be independent with HEP to improve strength and decrease knee pain to improve pain-free function at home and work. Baseline: 07/21/2024: Initial  HEP provided Goal status: INITIAL   LONG TERM GOALS: Target date: 10/13/2024  Pt will increase 30s STS repetitions by 5 reps in order to demonstrate improvements in LE strength and endurance.  Baseline: 07/21/2024: 20 reps (age related norms - 15-16) Goal status: INITIAL  2.  Pt will decrease worst knee pain by at least 3 points on the NPRS in order to demonstrate clinically significant reduction in knee pain. Baseline: 07/21/2024: 10/10 NPS Goal status: INITIAL  3.  Pt will increase LEFS score by at least 9 points in order demonstrate clinically significant reduction in knee pain/disability.     Baseline:  07/21/2024: 19/80 (80/80 - no disability) Goal status: INITIAL  4.  Pt will increase strength of L knee flexion/extension by at least 1/2 MMT grade (5/5) in order to demonstrate improvement in strength and function  Baseline: 07/21/2024: L knee Flexion/Extension: 4+/4+ out of 5 respectively. Goal status: INITIAL  5. Pt will report ability to walk 10-15  with minimal pain ( =/< 3/10 NPS) in order to demonstrate improved weight bearing tolerance and function at work and with community related tasks. Baseline: 07/21/2024: n/a Goal status: INITIAL   PLAN: PT FREQUENCY: 1-2x/week  PT DURATION: 12 weeks  PLANNED INTERVENTIONS: Therapeutic exercises, Therapeutic activity, Neuromuscular re-education, Balance training, Gait training, Patient/Family education, Self Care, Joint mobilization, Joint manipulation, Vestibular training, Canalith repositioning, Orthotic/Fit training, DME instructions, Dry Needling, Electrical stimulation, Spinal manipulation, Spinal mobilization, Cryotherapy, Moist heat, Taping, Traction, Ultrasound, Ionotophoresis 4mg /ml Dexamethasone , Manual therapy, and Re-evaluation.  PLAN FOR NEXT SESSION: Review HEP, Progressing Knee Strengthening, hip Strengthening, gait training    Lonni Pall PT, DPT Physical Therapist- [place]  09/09/2024, 4:49 PM  "

## 2024-09-23 ENCOUNTER — Ambulatory Visit

## 2024-09-23 DIAGNOSIS — M25562 Pain in left knee: Secondary | ICD-10-CM | POA: Diagnosis not present

## 2024-09-23 DIAGNOSIS — M6281 Muscle weakness (generalized): Secondary | ICD-10-CM

## 2024-09-23 DIAGNOSIS — R2689 Other abnormalities of gait and mobility: Secondary | ICD-10-CM

## 2024-09-23 DIAGNOSIS — G8929 Other chronic pain: Secondary | ICD-10-CM

## 2024-09-23 NOTE — Therapy (Signed)
 " OUTPATIENT PHYSICAL THERAPY KNEE TREATMENT  Patient Name: Desiree Schultz MRN: [id number] DOB:1971-03-22, 54 y.o., female Today's Date: 09/23/2024  END OF SESSION:  PT End of Session - 09/23/24 1646     Visit Number 5    Number of Visits 24    Date for Recertification  10/13/24    PT Start Time 1646    PT Stop Time 1730    PT Time Calculation (min) 44 min    Activity Tolerance Patient tolerated treatment well    Behavior During Therapy [facility] for tasks assessed/performed           Past Medical History:  Diagnosis Date   Allergic rhinitis    Anemia    Atrial fibrillation (HCC)    2006   Bronchial breathing    Chronic anxiety    Depression    Diabetes mellitus without complication (HCC)    Dysrhythmia    Elevated blood pressure    On therapy. Probably hypertension   Fibroid    Hx of cardiovascular stress test    a. Myoview  10/13:  no ischemia; EF 57%   Hypothyroid    Menorrhagia    2011   Obesity (BMI 30-39.9)    OSA (obstructive sleep apnea)    on CPAP   PCOS (polycystic ovarian syndrome)    PONV (postoperative nausea and vomiting)    C/o motion sickness   Prediabetes    Vitamin D  deficiency    Past Surgical History:  Procedure Laterality Date   ATRIAL FIBRILLATION ABLATION N/A 01/17/2018   Procedure: ATRIAL FIBRILLATION ABLATION;  Surgeon: Inocencio Soyla Lunger, MD;  Location: [facility];  Service: Cardiovascular;  Laterality: N/A;   BLADDER SURGERY  1997   interstitial cystitis    BREAST REDUCTION SURGERY  06/2013   BREAST SURGERY     reduction   DILATION AND CURETTAGE, DIAGNOSTIC / THERAPEUTIC     ENDOMETRIAL ABLATION     ENDOMETRIAL ABLATION  08/2010   HAND SURGERY  2006   due to cat bite   KNEE ARTHROSCOPY WITH MEDIAL MENISECTOMY Left 04/06/2015   Procedure: LEFT KNEE ARTHROSCOPY WITH PARTIAL MEDIAL MENISCECTOMY;  Surgeon: Kay CHRISTELLA Cummins, MD;  Location: [facility] OR;  Service: Orthopedics;  Laterality: Left;   OVARIAN CYST REMOVAL  04/28/13   REDUCTION  MAMMAPLASTY Bilateral 2014   RENAL BIOPSY, OPEN  2006   TONSILLECTOMY  2007   Patient Active Problem List   Diagnosis Date Noted   Paroxysmal A-fib (HCC) 01/17/2018   Chronic pain of right knee 05/28/2017   Controlled type 2 diabetes mellitus without complication, without long-term current use of insulin  (HCC) 08/30/2016   Menopause 08/30/2016   Metabolic syndrome 12/27/2015   Bilateral edema of lower extremity 09/27/2015   Proteinuria of undiagnosed cause 09/27/2015   Dorsocervical fat pad 08/24/2015   Prediabetes 02/10/2015   Left knee pain 12/21/2014   Depression 12/16/2014   Hypothyroidism 10/05/2014   Polycystic disease, ovaries 02/11/2014   Obesity 11/14/2011   OSA (obstructive sleep apnea) 01/09/2011   Atrial fibrillation (HCC) 12/01/2010   Healthcare maintenance 12/01/2010   GAIT ABNORMALITY 01/16/2010    PCP: Mat Browning, MD  REFERRING PROVIDER: Melvenia Manus BRAVO, MD  REFERRING DIAG:   RATIONALE FOR EVALUATION AND TREATMENT: Rehabilitation  THERAPY DIAG: Chronic pain of left knee  Other abnormalities of gait and mobility  Muscle weakness (generalized)  ONSET DATE: Chronic ( > 2 years)   FOLLOW-UP APPT SCHEDULED WITH REFERRING PROVIDER: Didn't Address   SUBJECTIVE:  SUBJECTIVE STATEMENT:    Patient is a 54 y.o. female reporting to OPPT with a chief concern of L knee pain.   PERTINENT HISTORY:   Patient presents to OPPT with chronic L knee pain and is seeking prehab prior to a planned left total knee replacement in July 2026. She reports that her physician identified a L meniscal tear via imaging (MRI) and moderate to severe osteoarthritic changes as the basis for surgical recommendation. Pain limits her ability to negotiate stairs, deep squat, and tolerate prolonged  standing or walking; symptoms are relieved only with sitting and rest. She currently manages pain with frequent NSAID use. She reports a sensation of a block preventing full terminal knee extension and notes intermittent catching and popping during walking or leg swinging.   She denies bowel or baldder dysfunction, saddle parasthesia, recent trauma, fever or chills.  PAIN:   Pain Intensity: Present: 1/10, Best: 1/10, Worst: 10/10 Pain location: L Knee Pain quality: constant and sharp  Radiating pain: Yes  Swelling: Yes  Popping, catching, locking: Yes  Numbness/Tingling: No Focal weakness or buckling: Yes How long can you stand: < 10 min  History of prior back, hip, or knee injury, pain, surgery, or therapy: No   PRECAUTIONS: Fall  WEIGHT BEARING RESTRICTIONS: No  FALLS: Has patient fallen in last 6 months? No  Living Environment Lives with: lives with their family Lives in: House/apartment Stairs: 3 External; No Rail Support   Prior level of function: Independent  Occupational demands: None   Hobbies: Fishing   Patient Goals: I would like to strengthen my knee in order to improve function before surgery    OBJECTIVE:   Patient Surveys   Extreme difficulty/unable (0), Quite a bit of difficulty (1), Moderate difficulty (2), Little difficulty (3), No difficulty (4) Survey date:  07/21/24  Any of your usual work, housework or school activities 1  2. Usual hobbies, recreational or sporting activities 1  3. Getting into/out of the bath 1  4. Walking between rooms 1  5. Putting on socks/shoes 3  6. Squatting  0  7. Lifting an object, like a bag of groceries from the floor 2  8. Performing light activities around your home 1  9. Performing heavy activities around your home 1  10. Getting into/out of a car 1  11. Walking 2 blocks 0  12. Walking 1 mile 0  13. Going up/down 10 stairs (1 flight) 0  14. Standing for 1 hour 0  15.  sitting for 1 hour 4  16.  Running on even ground 0  17. Running on uneven ground 0  18. Making sharp turns while running fast 0  19. Hopping  0  20. Rolling over in bed 3  Score total:  19/80 - 23%     Cognition WNL    Gross Musculoskeletal Assessment Tremor: None Bulk: Normal Tone: Normal  GAIT: Distance walked: 20 m Assistive device utilized: None Level of assistance: Complete Independence Comments: Reciprocal Gait, decreased weight bearing on L knee  Posture:  AROM AROM (Normal range in degrees) AROM  Hip Right Left  Flexion (125) WFL WFL  Extension (15)    Abduction (40)    Adduction     Internal Rotation (45)    External Rotation (45)        Knee    Flexion (135) 135 130  Extension (0) 0 3      Ankle    Dorsiflexion (20)    Plantarflexion (50)  Inversion (35)    Eversion (15    (* = pain; Blank rows = not tested)  LE MMT: MMT (out of 5) Right Left  Hip flexion 4 4  Hip extension    Hip abduction 4 4  Hip adduction    Hip internal rotation 4+ 4+  Hip external rotation 4+ 4+  Knee flexion 4+ 4+  Knee extension 5 4+  Ankle dorsiflexion 5 5  Ankle plantarflexion 5 5  Ankle inversion    Ankle eversion    (* = pain; Blank rows = not tested)  Sensation Grossly intact to light touch bilateral LEs as determined by testing dermatomes L2-S2. Proprioception, and hot/cold testing deferred on this date.  Reflexes R/L Knee Jerk (L3/4): 2+/2+  Ankle Jerk (S1/2): 2+/2+   Muscle Length Hamstrings: R: Negative L: Negative   Palpation Location LEFT  RIGHT           Quadriceps    Medial Hamstrings 0   Lateral Hamstrings 0   Lateral Hamstring tendon    Medial Hamstring tendon    Quadriceps tendon 0   Patella 0   Patellar Tendon 0   Tibial Tuberosity    Medial joint line 0 0  Lateral joint line 0 0  MCL 0 0  LCL 0   Adductor Tubercle    Pes Anserine tendon    Infrapatellar fat pad    Fibular head    Popliteal fossa    (Blank rows = not tested) Graded on 0-4  scale (0 = no pain, 1 = pain, 2 = pain with wincing/grimacing/flinching, 3 = pain with withdrawal, 4 = unwilling to allow palpation), (Blank rows = not tested)  Passive Accessory Motion Deferred   VASCULAR Dorsalis pedis and posterior tibial pulses are palpable   SPECIAL TESTS  Ligamentous Stability  ACL: Lachman's:  L: Negative  PCL: Posterior Drawer:  L: Negative  MCL: Valgus Stress (30 degrees flexion):  L: Negative  LCL: Varus Stress (30 degrees flexion):  L: Negative  Meniscus Tests Medial Meniscus (Tibial ER): L: Negative for concordant pain Lateral Meniscus (Tibial IR):  L: Negative for concordant pain  Functional Tests:  Squatting: WNL - Able to lift 20# KB 30s STS: 20 Reps    TODAY'S TREATMENT: DATE: 09/23/24  Subjective: Patient reports 2/10 pain in the L knee. Patient reports that she will consider the next appt as the discharge day.  No further or concerns.   Therapeutic Exercise:  Knee Extension: (Seat 3 with pillow on knee pad) 1 x 10 - 25#  2 x 10 - 35#   Hamstring Curl   3 x 10 - 45#  Supine Bridge - LE extended on heels  3 x 10   Supine Quadricep Stretch with Strap  R/L: 30s/bout x 2 in order to improve ROM/muscle tension  Therapeutic Activity:  NuStep L6-2 x 5 min x LE (Seat 9) - Pace Partner mode for LE endurance and strength; PT manually adjusted resistance per patient tolerance.  TRX Squats    3 x 10    Forward Lunges onto Stair Step  R/L: 2 x 10 ea leg   Side stepping against resistance   1 x 48' against grey TB  1 x 48' against grey TB around lower leg   Manual Therapy (10 min billed):  Moderate STM applied to quadricep musculature for pain modulation and decrease muscle tension. Myofascial release, trigger point and contract and relax techniques utilized. Pt endorsed improvements in pain following intervention.  Tibiofemoral Distraction   RL: 30s/bout x 3 ea leg in order to improve Knee ROM   PATIENT EDUCATION:   Education details: Exercise Technique Person educated: Patient Education method: Explanation, Demonstration, and Handouts Education comprehension: verbalized understanding and returned demonstration   HOME EXERCISE PROGRAM:   Access Code: [email or web address] URL: [email or web address] Date: 08/11/2024 Prepared by: Lonni Pall  Exercises - Supine Active Straight Leg Raise  - 1 x daily - 3-4 x weekly - 2-3 sets - 10 reps - Sidelying Hip Abduction  - 1 x daily - 3-4 x weekly - 2-3 sets - 10 reps - Supine Bridge  - 1 x daily - 3-4 x weekly - 2-3 sets - 10 reps - Mini Squat  - 1 x daily - 3-4 x weekly - 2-3 sets - 10 reps - Sit to Stand with Arms Crossed  - 1 x daily - 3-4 x weekly - 2-3 sets - 10-12 reps - Standing Bilateral Heel Raise on Step  - 1 x daily - 7 x weekly - 2-3 sets - 10-15 reps - Lunge with Counter Support  - 1 x daily - 3-4 x weekly - 2-3 sets - 10-15 reps - Supine Gluteus Stretch  - 2 x daily - 7 x weekly - 3 sets - 30 hold - Prone Quadriceps Stretch with Strap  - 2 x daily - 7 x weekly - 3 sets - 30 hold  Access Code: [email or web address] URL: [email or web address] Date: 07/21/2024 Prepared by: Lonni Pall  Exercises - Supine Active Straight Leg Raise  - 1 x daily - 3-4 x weekly - 2-3 sets - 10 reps - Sidelying Hip Abduction  - 1 x daily - 3-4 x weekly - 2-3 sets - 10 reps - Supine Bridge  - 1 x daily - 3-4 x weekly - 2-3 sets - 10 reps - Mini Squat  - 1 x daily - 3-4 x weekly - 2-3 sets - 10 reps - Sit to Stand with Arms Crossed  - 1 x daily - 3-4 x weekly - 2-3 sets - 10-12 reps - Supine Gluteus Stretch  - 2 x daily - 7 x weekly - 3 sets - 30 hold - Prone Quadriceps Stretch with Strap  - 2 x daily - 7 x weekly - 3 sets - 30 hold  ASSESSMENT:  CLINICAL IMPRESSION: Continued PT POC focused in management of L knee pain. Session focused on improving knee and hip strength for improved knee stability. Reapplied manual techniques in order to improve  joint space and decrease pain with terminal knee extension. She remains motivated to continue strength training in order to prehab her L knee prior to scheduled surgery. Patient's pain still moderate secondary to hx of meniscal tear and occupational demands of repetitive squatting. PT encouraged adherence to HEP in order to maintain progress and muscle strength. Based on today's performance patient will benefit from skilled PT in order to maximize return to PLOF and improve overall QoL.  OBJECTIVE IMPAIRMENTS: Abnormal gait, decreased activity tolerance, decreased endurance, difficulty walking, decreased strength, and pain.   ACTIVITY LIMITATIONS: carrying, lifting, bending, standing, squatting, and stairs  PARTICIPATION LIMITATIONS: shopping, community activity, occupation, and yard work  PERSONAL FACTORS: Age, Past/current experiences, Time since onset of injury/illness/exacerbation, and 1-2 comorbidities: metabolic syndrome, Paroxysmal A-fib are also affecting patient's functional outcome.   REHAB POTENTIAL: Good  CLINICAL DECISION MAKING: Evolving/moderate complexity  EVALUATION COMPLEXITY: Moderate   GOALS: Goals reviewed with patient? Yes  SHORT TERM GOALS: Target date: 09/01/2024  Pt will  be independent with HEP to improve strength and decrease knee pain to improve pain-free function at home and work. Baseline: 07/21/2024: Initial HEP provided Goal status: INITIAL   LONG TERM GOALS: Target date: 10/13/2024  Pt will increase 30s STS repetitions by 5 reps in order to demonstrate improvements in LE strength and endurance.  Baseline: 07/21/2024: 20 reps (age related norms - 15-16) Goal status: INITIAL  2.  Pt will decrease worst knee pain by at least 3 points on the NPRS in order to demonstrate clinically significant reduction in knee pain. Baseline: 07/21/2024: 10/10 NPS Goal status: INITIAL  3.  Pt will increase LEFS score by at least 9 points in order demonstrate  clinically significant reduction in knee pain/disability.     Baseline: 07/21/2024: 19/80 (80/80 - no disability) Goal status: INITIAL  4.  Pt will increase strength of L knee flexion/extension by at least 1/2 MMT grade (5/5) in order to demonstrate improvement in strength and function  Baseline: 07/21/2024: L knee Flexion/Extension: 4+/4+ out of 5 respectively. Goal status: INITIAL  5. Pt will report ability to walk 10-15  with minimal pain ( =/< 3/10 NPS) in order to demonstrate improved weight bearing tolerance and function at work and with community related tasks. Baseline: 07/21/2024: n/a Goal status: INITIAL   PLAN: PT FREQUENCY: 1-2x/week  PT DURATION: 12 weeks  PLANNED INTERVENTIONS: Therapeutic exercises, Therapeutic activity, Neuromuscular re-education, Balance training, Gait training, Patient/Family education, Self Care, Joint mobilization, Joint manipulation, Vestibular training, Canalith repositioning, Orthotic/Fit training, DME instructions, Dry Needling, Electrical stimulation, Spinal manipulation, Spinal mobilization, Cryotherapy, Moist heat, Taping, Traction, Ultrasound, Ionotophoresis 4mg /ml Dexamethasone , Manual therapy, and Re-evaluation.  PLAN FOR NEXT SESSION: Review HEP, Progressing Knee Strengthening, hip Strengthening, gait training    Lonni Pall PT, DPT Physical Therapist- [place]  09/23/2024, 5:53 PM  "

## 2024-10-07 ENCOUNTER — Ambulatory Visit

## 2024-10-07 DIAGNOSIS — R2689 Other abnormalities of gait and mobility: Secondary | ICD-10-CM

## 2024-10-07 DIAGNOSIS — M6281 Muscle weakness (generalized): Secondary | ICD-10-CM

## 2024-10-07 DIAGNOSIS — G8929 Other chronic pain: Secondary | ICD-10-CM

## 2024-10-07 NOTE — Therapy (Signed)
 " OUTPATIENT PHYSICAL THERAPY KNEE TREATMENT/DISCHARGE  Patient Name: Desiree Schultz MRN: [id number] DOB:1970/11/25, 54 y.o., female Today's Date: 10/07/2024  END OF SESSION:  PT End of Session - 10/07/24 1645     Visit Number 6    Number of Visits 24    Date for Recertification  10/13/24    PT Start Time 1645    PT Stop Time 1730    PT Time Calculation (min) 45 min    Activity Tolerance Patient tolerated treatment well    Behavior During Therapy [facility] for tasks assessed/performed           Past Medical History:  Diagnosis Date   Allergic rhinitis    Anemia    Atrial fibrillation (HCC)    2006   Bronchial breathing    Chronic anxiety    Depression    Diabetes mellitus without complication (HCC)    Dysrhythmia    Elevated blood pressure    On therapy. Probably hypertension   Fibroid    Hx of cardiovascular stress test    a. Myoview  10/13:  no ischemia; EF 57%   Hypothyroid    Menorrhagia    2011   Obesity (BMI 30-39.9)    OSA (obstructive sleep apnea)    on CPAP   PCOS (polycystic ovarian syndrome)    PONV (postoperative nausea and vomiting)    C/o motion sickness   Prediabetes    Vitamin D  deficiency    Past Surgical History:  Procedure Laterality Date   ATRIAL FIBRILLATION ABLATION N/A 01/17/2018   Procedure: ATRIAL FIBRILLATION ABLATION;  Surgeon: Inocencio Soyla Lunger, MD;  Location: [facility];  Service: Cardiovascular;  Laterality: N/A;   BLADDER SURGERY  1997   interstitial cystitis    BREAST REDUCTION SURGERY  06/2013   BREAST SURGERY     reduction   DILATION AND CURETTAGE, DIAGNOSTIC / THERAPEUTIC     ENDOMETRIAL ABLATION     ENDOMETRIAL ABLATION  08/2010   HAND SURGERY  2006   due to cat bite   KNEE ARTHROSCOPY WITH MEDIAL MENISECTOMY Left 04/06/2015   Procedure: LEFT KNEE ARTHROSCOPY WITH PARTIAL MEDIAL MENISCECTOMY;  Surgeon: Kay CHRISTELLA Cummins, MD;  Location: [facility] OR;  Service: Orthopedics;  Laterality: Left;   OVARIAN CYST REMOVAL  04/28/13    REDUCTION MAMMAPLASTY Bilateral 2014   RENAL BIOPSY, OPEN  2006   TONSILLECTOMY  2007   Patient Active Problem List   Diagnosis Date Noted   Paroxysmal A-fib (HCC) 01/17/2018   Chronic pain of right knee 05/28/2017   Controlled type 2 diabetes mellitus without complication, without long-term current use of insulin  (HCC) 08/30/2016   Menopause 08/30/2016   Metabolic syndrome 12/27/2015   Bilateral edema of lower extremity 09/27/2015   Proteinuria of undiagnosed cause 09/27/2015   Dorsocervical fat pad 08/24/2015   Prediabetes 02/10/2015   Left knee pain 12/21/2014   Depression 12/16/2014   Hypothyroidism 10/05/2014   Polycystic disease, ovaries 02/11/2014   Obesity 11/14/2011   OSA (obstructive sleep apnea) 01/09/2011   Atrial fibrillation (HCC) 12/01/2010   Healthcare maintenance 12/01/2010   GAIT ABNORMALITY 01/16/2010    PCP: Mat Browning, MD  REFERRING PROVIDER: Melvenia Manus BRAVO, MD  REFERRING DIAG:   RATIONALE FOR EVALUATION AND TREATMENT: Rehabilitation  THERAPY DIAG: Chronic pain of left knee  Other abnormalities of gait and mobility  Muscle weakness (generalized)  ONSET DATE: Chronic ( > 2 years)   FOLLOW-UP APPT SCHEDULED WITH REFERRING PROVIDER: Didn't Address   SUBJECTIVE:  SUBJECTIVE STATEMENT:    Patient is a 54 y.o. female reporting to OPPT with a chief concern of L knee pain.   PERTINENT HISTORY:   Patient presents to OPPT with chronic L knee pain and is seeking prehab prior to a planned left total knee replacement in July 2026. She reports that her physician identified a L meniscal tear via imaging (MRI) and moderate to severe osteoarthritic changes as the basis for surgical recommendation. Pain limits her ability to negotiate stairs, deep squat, and tolerate  prolonged standing or walking; symptoms are relieved only with sitting and rest. She currently manages pain with frequent NSAID use. She reports a sensation of a block preventing full terminal knee extension and notes intermittent catching and popping during walking or leg swinging.   She denies bowel or baldder dysfunction, saddle parasthesia, recent trauma, fever or chills.  PAIN:   Pain Intensity: Present: 1/10, Best: 1/10, Worst: 10/10 Pain location: L Knee Pain quality: constant and sharp  Radiating pain: Yes  Swelling: Yes  Popping, catching, locking: Yes  Numbness/Tingling: No Focal weakness or buckling: Yes How long can you stand: < 10 min  History of prior back, hip, or knee injury, pain, surgery, or therapy: No   PRECAUTIONS: Fall  WEIGHT BEARING RESTRICTIONS: No  FALLS: Has patient fallen in last 6 months? No  Living Environment Lives with: lives with their family Lives in: House/apartment Stairs: 3 External; No Rail Support   Prior level of function: Independent  Occupational demands: None   Hobbies: Fishing   Patient Goals: I would like to strengthen my knee in order to improve function before surgery    OBJECTIVE:   Patient Surveys   Extreme difficulty/unable (0), Quite a bit of difficulty (1), Moderate difficulty (2), Little difficulty (3), No difficulty (4) Survey date:  07/21/24  Any of your usual work, housework or school activities 1  2. Usual hobbies, recreational or sporting activities 1  3. Getting into/out of the bath 1  4. Walking between rooms 1  5. Putting on socks/shoes 3  6. Squatting  0  7. Lifting an object, like a bag of groceries from the floor 2  8. Performing light activities around your home 1  9. Performing heavy activities around your home 1  10. Getting into/out of a car 1  11. Walking 2 blocks 0  12. Walking 1 mile 0  13. Going up/down 10 stairs (1 flight) 0  14. Standing for 1 hour 0  15.  sitting for 1 hour 4   16. Running on even ground 0  17. Running on uneven ground 0  18. Making sharp turns while running fast 0  19. Hopping  0  20. Rolling over in bed 3  Score total:  19/80 - 23%     Cognition WNL    Gross Musculoskeletal Assessment Tremor: None Bulk: Normal Tone: Normal  GAIT: Distance walked: 20 m Assistive device utilized: None Level of assistance: Complete Independence Comments: Reciprocal Gait, decreased weight bearing on L knee  Posture:  AROM AROM (Normal range in degrees) AROM  Hip Right Left  Flexion (125) WFL WFL  Extension (15)    Abduction (40)    Adduction     Internal Rotation (45)    External Rotation (45)        Knee    Flexion (135) 135 130  Extension (0) 0 3      Ankle    Dorsiflexion (20)    Plantarflexion (50)  Inversion (35)    Eversion (15    (* = pain; Blank rows = not tested)  LE MMT: MMT (out of 5) Right Left  Hip flexion 4 4  Hip extension    Hip abduction 4 4  Hip adduction    Hip internal rotation 4+ 4+  Hip external rotation 4+ 4+  Knee flexion 4+ 4+  Knee extension 5 4+  Ankle dorsiflexion 5 5  Ankle plantarflexion 5 5  Ankle inversion    Ankle eversion    (* = pain; Blank rows = not tested)  Sensation Grossly intact to light touch bilateral LEs as determined by testing dermatomes L2-S2. Proprioception, and hot/cold testing deferred on this date.  Reflexes R/L Knee Jerk (L3/4): 2+/2+  Ankle Jerk (S1/2): 2+/2+   Muscle Length Hamstrings: R: Negative L: Negative   Palpation Location LEFT  RIGHT           Quadriceps    Medial Hamstrings 0   Lateral Hamstrings 0   Lateral Hamstring tendon    Medial Hamstring tendon    Quadriceps tendon 0   Patella 0   Patellar Tendon 0   Tibial Tuberosity    Medial joint line 0 0  Lateral joint line 0 0  MCL 0 0  LCL 0   Adductor Tubercle    Pes Anserine tendon    Infrapatellar fat pad    Fibular head    Popliteal fossa    (Blank rows = not tested) Graded on  0-4 scale (0 = no pain, 1 = pain, 2 = pain with wincing/grimacing/flinching, 3 = pain with withdrawal, 4 = unwilling to allow palpation), (Blank rows = not tested)  Passive Accessory Motion Deferred   VASCULAR Dorsalis pedis and posterior tibial pulses are palpable   SPECIAL TESTS  Ligamentous Stability  ACL: Lachman's:  L: Negative  PCL: Posterior Drawer:  L: Negative  MCL: Valgus Stress (30 degrees flexion):  L: Negative  LCL: Varus Stress (30 degrees flexion):  L: Negative  Meniscus Tests Medial Meniscus (Tibial ER): L: Negative for concordant pain Lateral Meniscus (Tibial IR):  L: Negative for concordant pain  Functional Tests:  Squatting: WNL - Able to lift 20# KB 30s STS: 20 Reps    TODAY'S TREATMENT: DATE: 10/07/24  Subjective:  Patient still agreeable discharge.  No further or concerns.   Physical Performance Meausure (2 min unbilled):   30s STS: 27 reps (Age related score - 15-16)   The 30s STS test assesses lower body strength and endurance by measuring how many times a person can stand up from and sit down on a chair in 30 seconds  MMT: See Goals below   Therapeutic Exercise:  Knee Extension: (Seat 3 with pillow on knee pad) 1 x 10 - 35#  2 x 10 - 45#   Hamstring Curl   1 x 10 - 45#   2 x 10 - 55#   Supine Bridge - LE extended on heels  3 x 10   Time Reviewing HEP in order to maintain progress.   Therapeutic Activity:  TRX Squats    2 x 8   Split Lunge onto Stair step for improved lunging at work  R/L Leg: 2 x 10  ea leg   Side stepping against resistance   4 x 12' - Black TB around ankles  4 x 12' - Black TB around ankles  Manual Therapy (10 min billed):  Moderate STM applied to both quadricep musculature for pain modulation  and decrease muscle tension. Myofascial release, trigger point and contract and relax techniques utilized. Pt endorsed improvements in pain following intervention.   IASTM applied to bilateral quadricep  muscle group to reduce muscle tension. ( 2 min ea leg )    PATIENT EDUCATION:  Education details: Exercise Technique Person educated: Patient Education method: Explanation, Demonstration, and Handouts Education comprehension: verbalized understanding and returned demonstration   HOME EXERCISE PROGRAM:   Access Code: [email or web address] URL: [email or web address] Date: 08/11/2024 Prepared by: Lonni Pall  Exercises - Supine Active Straight Leg Raise  - 1 x daily - 3-4 x weekly - 2-3 sets - 10 reps - Sidelying Hip Abduction  - 1 x daily - 3-4 x weekly - 2-3 sets - 10 reps - Supine Bridge  - 1 x daily - 3-4 x weekly - 2-3 sets - 10 reps - Mini Squat  - 1 x daily - 3-4 x weekly - 2-3 sets - 10 reps - Sit to Stand with Arms Crossed  - 1 x daily - 3-4 x weekly - 2-3 sets - 10-12 reps - Standing Bilateral Heel Raise on Step  - 1 x daily - 7 x weekly - 2-3 sets - 10-15 reps - Lunge with Counter Support  - 1 x daily - 3-4 x weekly - 2-3 sets - 10-15 reps - Supine Gluteus Stretch  - 2 x daily - 7 x weekly - 3 sets - 30 hold - Prone Quadriceps Stretch with Strap  - 2 x daily - 7 x weekly - 3 sets - 30 hold  Access Code: [email or web address] URL: [email or web address] Date: 07/21/2024 Prepared by: Lonni Pall  Exercises - Supine Active Straight Leg Raise  - 1 x daily - 3-4 x weekly - 2-3 sets - 10 reps - Sidelying Hip Abduction  - 1 x daily - 3-4 x weekly - 2-3 sets - 10 reps - Supine Bridge  - 1 x daily - 3-4 x weekly - 2-3 sets - 10 reps - Mini Squat  - 1 x daily - 3-4 x weekly - 2-3 sets - 10 reps - Sit to Stand with Arms Crossed  - 1 x daily - 3-4 x weekly - 2-3 sets - 10-12 reps - Supine Gluteus Stretch  - 2 x daily - 7 x weekly - 3 sets - 30 hold - Prone Quadriceps Stretch with Strap  - 2 x daily - 7 x weekly - 3 sets - 30 hold  ASSESSMENT:  CLINICAL IMPRESSION: Haydee Jabbour is a 54 y.o. female seen for bilateral knee . Pt self discharging from POC and is agreeable  to discharge to home with HEP. Pt has met 3/5 LTG and demonstrates improvements in LE Strength and endurance however her pain still persists along the knee joint. She endorses that her knee still intermittent catches causing extreme pain. Her pain limits her from prolonged walking. Patient self reported 31/80 on LEFS  (80/80 = no disability) indicating moderate functional limitations with daily activities, work and recreational tasks. Since initial visit patient's LEFS score represents a significant improvement with functional tasks/ADLS. The patient has achieved independence with daily activities and is able to perform exercises with proper technique. They no longer require skilled physical therapy interventions and are discharged with a home exercise program to maintain progress. No question at end of session. Follow-up with their primary care provider is recommended if any issues arise.   OBJECTIVE IMPAIRMENTS: Abnormal gait, decreased activity tolerance, decreased endurance, difficulty walking, decreased strength, and pain.   ACTIVITY  LIMITATIONS: carrying, lifting, bending, standing, squatting, and stairs  PARTICIPATION LIMITATIONS: shopping, community activity, occupation, and yard work  PERSONAL FACTORS: Age, Past/current experiences, Time since onset of injury/illness/exacerbation, and 1-2 comorbidities: metabolic syndrome, Paroxysmal A-fib are also affecting patient's functional outcome.   REHAB POTENTIAL: Good  CLINICAL DECISION MAKING: Evolving/moderate complexity  EVALUATION COMPLEXITY: Moderate   GOALS: Goals reviewed with patient? Yes  SHORT TERM GOALS: Target date: 09/01/2024  Pt will be independent with HEP to improve strength and decrease knee pain to improve pain-free function at home and work. Baseline: 07/21/2024: Initial HEP provided; 25% adherence  Goal status: Goal progressing    LONG TERM GOALS: Target date: 10/13/2024  Pt will increase 30s STS repetitions by 5  reps in order to demonstrate improvements in LE strength and endurance.  Baseline: 07/21/2024: 20 reps (age related norms - 15-16) 10/07/2024: 27 reps Goal status: Goal  Met   2.  Pt will decrease worst knee pain by at least 3 points on the NPRS in order to demonstrate clinically significant reduction in knee pain. Baseline: 07/21/2024: 10/10 NPS; 10/07/2024: 10/10 NPS Goal status: Not Met  3.  Pt will increase LEFS score by at least 9 points in order demonstrate clinically significant reduction in knee pain/disability.     Baseline: 07/21/2024: 19/80 (80/80 - no disability); 31/80 (80/80 no disabiliy)  Goal status: Goal Met  4.  Pt will increase strength of L knee flexion/extension by at least 1/2 MMT grade (5/5) in order to demonstrate improvement in strength and function  Baseline: 07/21/2024: L knee Flexion/Extension: 4+/4+ out of 5 respectively.; 10/07/2024: 5/5 out of 5 respectively  Goal status: Goal met   5. Pt will report ability to walk 10-15  with minimal pain ( =/< 3/10 NPS) in order to demonstrate improved weight bearing tolerance and function at work and with community related tasks. Baseline: 07/21/2024: n/a; 10/07/2024: 10 min walk, pt with report of  Goal status: Goal not met   PLAN: PT FREQUENCY: 1-2x/week  PT DURATION: 12 weeks  PLANNED INTERVENTIONS: Discharge  PLAN FOR NEXT SESSION: Discharge   Lonni Pall PT, DPT Physical Therapist- [place]  10/07/2024, 5:35 PM  "

## 2024-10-21 ENCOUNTER — Ambulatory Visit

## 2024-11-04 ENCOUNTER — Ambulatory Visit
